# Patient Record
Sex: Female | Born: 1975
Health system: Southern US, Community
[De-identification: ages and names within clinical notes are randomized; demographics above are authoritative.]

## PROBLEM LIST (undated history)

## (undated) DIAGNOSIS — H269 Unspecified cataract: Secondary | ICD-10-CM

## (undated) DIAGNOSIS — G90529 Complex regional pain syndrome I of unspecified lower limb: Secondary | ICD-10-CM

## (undated) DIAGNOSIS — R2230 Localized swelling, mass and lump, unspecified upper limb: Secondary | ICD-10-CM

## (undated) DIAGNOSIS — Z889 Allergy status to unspecified drugs, medicaments and biological substances status: Secondary | ICD-10-CM

## (undated) DIAGNOSIS — T7840XA Allergy, unspecified, initial encounter: Secondary | ICD-10-CM

## (undated) DIAGNOSIS — E119 Type 2 diabetes mellitus without complications: Secondary | ICD-10-CM

## (undated) DIAGNOSIS — F419 Anxiety disorder, unspecified: Secondary | ICD-10-CM

## (undated) DIAGNOSIS — G4730 Sleep apnea, unspecified: Secondary | ICD-10-CM

## (undated) DIAGNOSIS — N189 Chronic kidney disease, unspecified: Secondary | ICD-10-CM

## (undated) DIAGNOSIS — I10 Essential (primary) hypertension: Secondary | ICD-10-CM

## (undated) DIAGNOSIS — K8020 Calculus of gallbladder without cholecystitis without obstruction: Secondary | ICD-10-CM

## (undated) HISTORY — DX: Allergy, unspecified, initial encounter: T78.40XA

## (undated) HISTORY — DX: Essential (primary) hypertension: I10

## (undated) HISTORY — PX: ABDOMINAL HYSTERECTOMY: SHX81

## (undated) HISTORY — DX: Anxiety disorder, unspecified: F41.9

## (undated) HISTORY — PX: TUBAL LIGATION: SHX77

## (undated) HISTORY — DX: Unspecified cataract: H26.9

## (undated) HISTORY — DX: Calculus of gallbladder without cholecystitis without obstruction: K80.20

## (undated) HISTORY — DX: Chronic kidney disease, unspecified: N18.9

## (undated) HISTORY — DX: Sleep apnea, unspecified: G47.30

---

## 1993-10-26 HISTORY — PX: KNEE ARTHROSCOPY: SUR90

## 1994-05-04 ENCOUNTER — Encounter: Payer: Self-pay | Admitting: Family Medicine

## 2003-01-28 ENCOUNTER — Emergency Department (HOSPITAL_COMMUNITY): Admission: EM | Admit: 2003-01-28 | Discharge: 2003-01-28 | Payer: Self-pay | Admitting: Emergency Medicine

## 2003-07-13 ENCOUNTER — Emergency Department (HOSPITAL_COMMUNITY): Admission: EM | Admit: 2003-07-13 | Discharge: 2003-07-13 | Payer: Self-pay | Admitting: Emergency Medicine

## 2003-07-13 ENCOUNTER — Encounter: Payer: Self-pay | Admitting: Emergency Medicine

## 2003-10-23 ENCOUNTER — Ambulatory Visit (HOSPITAL_COMMUNITY): Admission: RE | Admit: 2003-10-23 | Discharge: 2003-10-23 | Payer: Self-pay | Admitting: Internal Medicine

## 2004-10-09 ENCOUNTER — Inpatient Hospital Stay (HOSPITAL_COMMUNITY): Admission: AD | Admit: 2004-10-09 | Discharge: 2004-10-09 | Payer: Self-pay | Admitting: *Deleted

## 2004-10-13 ENCOUNTER — Inpatient Hospital Stay (HOSPITAL_COMMUNITY): Admission: AD | Admit: 2004-10-13 | Discharge: 2004-10-13 | Payer: Self-pay | Admitting: Obstetrics and Gynecology

## 2005-03-29 ENCOUNTER — Inpatient Hospital Stay (HOSPITAL_COMMUNITY): Admission: AD | Admit: 2005-03-29 | Discharge: 2005-04-03 | Payer: Self-pay | Admitting: Obstetrics & Gynecology

## 2005-04-07 ENCOUNTER — Encounter: Admission: RE | Admit: 2005-04-07 | Discharge: 2005-04-07 | Payer: Self-pay | Admitting: Obstetrics & Gynecology

## 2005-04-24 ENCOUNTER — Inpatient Hospital Stay (HOSPITAL_COMMUNITY): Admission: AD | Admit: 2005-04-24 | Discharge: 2005-04-25 | Payer: Self-pay | Admitting: Obstetrics & Gynecology

## 2005-05-01 ENCOUNTER — Inpatient Hospital Stay (HOSPITAL_COMMUNITY): Admission: AD | Admit: 2005-05-01 | Discharge: 2005-05-01 | Payer: Self-pay | Admitting: Obstetrics and Gynecology

## 2005-05-04 ENCOUNTER — Inpatient Hospital Stay (HOSPITAL_COMMUNITY): Admission: AD | Admit: 2005-05-04 | Discharge: 2005-05-04 | Payer: Self-pay | Admitting: Obstetrics and Gynecology

## 2005-05-07 ENCOUNTER — Inpatient Hospital Stay (HOSPITAL_COMMUNITY): Admission: AD | Admit: 2005-05-07 | Discharge: 2005-05-07 | Payer: Self-pay | Admitting: Obstetrics and Gynecology

## 2005-05-10 ENCOUNTER — Inpatient Hospital Stay (HOSPITAL_COMMUNITY): Admission: AD | Admit: 2005-05-10 | Discharge: 2005-05-10 | Payer: Self-pay | Admitting: Obstetrics and Gynecology

## 2005-05-11 ENCOUNTER — Inpatient Hospital Stay (HOSPITAL_COMMUNITY): Admission: AD | Admit: 2005-05-11 | Discharge: 2005-05-13 | Payer: Self-pay | Admitting: Obstetrics and Gynecology

## 2005-07-06 ENCOUNTER — Other Ambulatory Visit: Admission: RE | Admit: 2005-07-06 | Discharge: 2005-07-06 | Payer: Self-pay | Admitting: Obstetrics & Gynecology

## 2005-10-14 ENCOUNTER — Ambulatory Visit: Payer: Self-pay | Admitting: Family Medicine

## 2005-10-28 ENCOUNTER — Ambulatory Visit: Payer: Self-pay | Admitting: Family Medicine

## 2005-11-09 ENCOUNTER — Ambulatory Visit: Payer: Self-pay | Admitting: Family Medicine

## 2006-06-07 ENCOUNTER — Ambulatory Visit: Payer: Self-pay | Admitting: Family Medicine

## 2006-06-10 ENCOUNTER — Emergency Department (HOSPITAL_COMMUNITY): Admission: EM | Admit: 2006-06-10 | Discharge: 2006-06-11 | Payer: Self-pay | Admitting: Emergency Medicine

## 2006-06-11 ENCOUNTER — Ambulatory Visit: Payer: Self-pay | Admitting: Family Medicine

## 2006-09-07 ENCOUNTER — Ambulatory Visit: Payer: Self-pay | Admitting: Family Medicine

## 2006-10-26 HISTORY — PX: BREAST BIOPSY: SHX20

## 2006-11-19 ENCOUNTER — Encounter: Admission: RE | Admit: 2006-11-19 | Discharge: 2006-11-19 | Payer: Self-pay | Admitting: Obstetrics & Gynecology

## 2006-11-23 ENCOUNTER — Encounter: Admission: RE | Admit: 2006-11-23 | Discharge: 2006-11-23 | Payer: Self-pay | Admitting: Obstetrics & Gynecology

## 2006-12-20 ENCOUNTER — Ambulatory Visit (HOSPITAL_COMMUNITY): Admission: RE | Admit: 2006-12-20 | Discharge: 2006-12-20 | Payer: Self-pay | Admitting: General Surgery

## 2006-12-20 ENCOUNTER — Encounter (INDEPENDENT_AMBULATORY_CARE_PROVIDER_SITE_OTHER): Payer: Self-pay | Admitting: *Deleted

## 2006-12-20 HISTORY — PX: BREAST LUMPECTOMY W/ NEEDLE LOCALIZATION: SHX1266

## 2007-04-18 ENCOUNTER — Ambulatory Visit: Payer: Self-pay | Admitting: Family Medicine

## 2007-08-23 DIAGNOSIS — J45909 Unspecified asthma, uncomplicated: Secondary | ICD-10-CM | POA: Insufficient documentation

## 2007-11-17 ENCOUNTER — Ambulatory Visit: Payer: Self-pay | Admitting: Family Medicine

## 2008-01-02 ENCOUNTER — Telehealth: Payer: Self-pay | Admitting: Family Medicine

## 2008-01-03 ENCOUNTER — Ambulatory Visit: Payer: Self-pay | Admitting: Family Medicine

## 2008-01-03 LAB — CONVERTED CEMR LAB: Rapid Strep: NEGATIVE

## 2008-03-14 ENCOUNTER — Ambulatory Visit: Payer: Self-pay | Admitting: Internal Medicine

## 2008-03-14 DIAGNOSIS — J45901 Unspecified asthma with (acute) exacerbation: Secondary | ICD-10-CM | POA: Insufficient documentation

## 2008-03-15 DIAGNOSIS — J309 Allergic rhinitis, unspecified: Secondary | ICD-10-CM | POA: Insufficient documentation

## 2008-03-22 ENCOUNTER — Telehealth: Payer: Self-pay | Admitting: Family Medicine

## 2008-03-23 ENCOUNTER — Ambulatory Visit: Payer: Self-pay | Admitting: Family Medicine

## 2008-03-23 DIAGNOSIS — F411 Generalized anxiety disorder: Secondary | ICD-10-CM | POA: Insufficient documentation

## 2008-08-13 ENCOUNTER — Telehealth: Payer: Self-pay | Admitting: Family Medicine

## 2008-09-24 ENCOUNTER — Telehealth: Payer: Self-pay | Admitting: Family Medicine

## 2008-12-04 ENCOUNTER — Ambulatory Visit: Payer: Self-pay | Admitting: Family Medicine

## 2009-02-26 ENCOUNTER — Inpatient Hospital Stay (HOSPITAL_COMMUNITY): Admission: AD | Admit: 2009-02-26 | Discharge: 2009-02-26 | Payer: Self-pay | Admitting: Obstetrics and Gynecology

## 2009-02-26 ENCOUNTER — Ambulatory Visit: Payer: Self-pay | Admitting: Family

## 2009-03-01 ENCOUNTER — Inpatient Hospital Stay (HOSPITAL_COMMUNITY): Admission: AD | Admit: 2009-03-01 | Discharge: 2009-03-01 | Payer: Self-pay | Admitting: Obstetrics & Gynecology

## 2009-03-26 ENCOUNTER — Ambulatory Visit: Payer: Self-pay | Admitting: Physician Assistant

## 2009-03-26 ENCOUNTER — Inpatient Hospital Stay (HOSPITAL_COMMUNITY): Admission: AD | Admit: 2009-03-26 | Discharge: 2009-03-26 | Payer: Self-pay | Admitting: Obstetrics and Gynecology

## 2009-04-08 ENCOUNTER — Observation Stay (HOSPITAL_COMMUNITY): Admission: AD | Admit: 2009-04-08 | Discharge: 2009-04-09 | Payer: Self-pay | Admitting: Obstetrics and Gynecology

## 2009-04-09 ENCOUNTER — Encounter: Payer: Self-pay | Admitting: Obstetrics and Gynecology

## 2009-04-29 ENCOUNTER — Inpatient Hospital Stay (HOSPITAL_COMMUNITY): Admission: AD | Admit: 2009-04-29 | Discharge: 2009-05-13 | Payer: Self-pay | Admitting: Obstetrics and Gynecology

## 2009-06-13 ENCOUNTER — Inpatient Hospital Stay (HOSPITAL_COMMUNITY): Admission: AD | Admit: 2009-06-13 | Discharge: 2009-06-15 | Payer: Self-pay | Admitting: Obstetrics and Gynecology

## 2009-06-26 ENCOUNTER — Inpatient Hospital Stay (HOSPITAL_COMMUNITY): Admission: AD | Admit: 2009-06-26 | Discharge: 2009-07-04 | Payer: Self-pay | Admitting: Obstetrics and Gynecology

## 2009-06-30 ENCOUNTER — Encounter (INDEPENDENT_AMBULATORY_CARE_PROVIDER_SITE_OTHER): Payer: Self-pay | Admitting: Obstetrics and Gynecology

## 2010-10-31 ENCOUNTER — Encounter: Payer: Self-pay | Admitting: Family Medicine

## 2010-10-31 ENCOUNTER — Ambulatory Visit: Admit: 2010-10-31 | Payer: Self-pay | Admitting: Family Medicine

## 2010-10-31 ENCOUNTER — Ambulatory Visit
Admission: RE | Admit: 2010-10-31 | Discharge: 2010-10-31 | Payer: Self-pay | Source: Home / Self Care | Attending: Family Medicine | Admitting: Family Medicine

## 2010-11-18 ENCOUNTER — Encounter: Payer: Self-pay | Admitting: Family Medicine

## 2010-11-27 NOTE — Assessment & Plan Note (Signed)
Summary: cough/asthma/MMR,TDAP/?tetanus if needed/cjr   Vital Signs:  Patient profile:   35 year old female Weight:      169 pounds O2 Sat:      98 % Temp:     98.6 degrees F Pulse rate:   80 / minute BP sitting:   140 / 80  (left arm)  Vitals Entered By: Pura Spice, RN (October 31, 2010 10:38 AM) CC: cough asthma wheezing  needs tdap and mmr  going back to school   History of Present Illness: Here for an immunization update and to check her lungs. She had a URI a few weeks ago with a productive cough and a fever. This resolved but she has had a dry cough since then. She will be attending [employer] this spring and has some forms to fill out.   Allergies: 1)  ! * Zyrtec  Past History:  Past Medical History: Reviewed history from 03/14/2008 and no changes required. Asthma Migraines grass trees positive on allergy test.  Allergic rhinitis  Review of Systems  The patient denies anorexia, fever, weight loss, weight gain, vision loss, decreased hearing, hoarseness, chest pain, syncope, dyspnea on exertion, peripheral edema, headaches, hemoptysis, abdominal pain, melena, hematochezia, severe indigestion/heartburn, hematuria, incontinence, genital sores, muscle weakness, suspicious skin lesions, transient blindness, difficulty walking, depression, unusual weight change, abnormal bleeding, enlarged lymph nodes, angioedema, breast masses, and testicular masses.    Physical Exam  General:  Well-developed,well-nourished,in no acute distress; alert,appropriate and cooperative throughout examination Head:  Normocephalic and atraumatic without obvious abnormalities. No apparent alopecia or balding. Eyes:  No corneal or conjunctival inflammation noted. EOMI. Perrla. Funduscopic exam benign, without hemorrhages, exudates or papilledema. Vision grossly normal. Ears:  External ear exam shows no significant lesions or deformities.  Otoscopic examination reveals clear canals, tympanic membranes are  intact bilaterally without bulging, retraction, inflammation or discharge. Hearing is grossly normal bilaterally. Nose:  External nasal examination shows no deformity or inflammation. Nasal mucosa are pink and moist without lesions or exudates. Mouth:  Oral mucosa and oropharynx without lesions or exudates.  Teeth in good repair. Neck:  No deformities, masses, or tenderness noted. Lungs:  Normal respiratory effort, chest expands symmetrically. Lungs are clear to auscultation, no crackles or wheezes.   Impression & Recommendations:  Problem # 1:  VIRAL URI (ICD-465.9)  The following medications were removed from the medication list:    Hycodan 5-1.5 Mg/2ml Syrp (Hydrocodone-homatropine) .Marland Kitchen... 1-2 tsp by mouth q 4-6 hours  as needed cough Her updated medication list for this problem includes:    Hydromet 5-1.5 Mg/57ml Syrp (Hydrocodone-homatropine) .Marland Kitchen... 1 tsp q 4 hours as needed cough  Problem # 2:  ASTHMA (ICD-493.90)  The following medications were removed from the medication list:    Albuterol 90 Mcg/act Aers (Albuterol) .Marland Kitchen... As needed    Duoneb 2.5-0.5 Mg/83ml Soln (Albuterol-ipratropium) .Marland Kitchen... 1 every 4 hours as needed for sob Her updated medication list for this problem includes:    Singulair 10 Mg Tabs (Montelukast sodium) .Marland Kitchen... 1 by mouth once daily    Advair Diskus 250-50 Mcg/dose Misc (Fluticasone-salmeterol) .Marland Kitchen... 1 puff 2 times daily    Albuterol Sulfate (2.5 Mg/8ml) 0.083% Nebu (Albuterol sulfate) ..... Use q 4 hours as needed    Ventolin Hfa 108 (90 Base) Mcg/act Aers (Albuterol sulfate) .Marland Kitchen... 2 puffs q 4 hours as needed  Complete Medication List: 1)  Singulair 10 Mg Tabs (Montelukast sodium) .Marland Kitchen.. 1 by mouth once daily 2)  Advair Diskus 250-50 Mcg/dose Misc (Fluticasone-salmeterol) .Marland KitchenMarland KitchenMarland Kitchen  1 puff 2 times daily 3)  Albuterol Sulfate (2.5 Mg/33ml) 0.083% Nebu (Albuterol sulfate) .... Use q 4 hours as needed 4)  Ventolin Hfa 108 (90 Base) Mcg/act Aers (Albuterol sulfate) .... 2  puffs q 4 hours as needed 5)  Hydromet 5-1.5 Mg/92ml Syrp (Hydrocodone-homatropine) .Marland Kitchen.. 1 tsp q 4 hours as needed cough  Other Orders: Tdap => 15yrs IM (14782) State-Menactra IM (95621H) Admin 1st Vaccine (08657) Admin of Any Addtl Vaccine (84696)  Patient Instructions: 1)  Please schedule a follow-up appointment as needed .  Prescriptions: HYDROMET 5-1.5 MG/5ML SYRP (HYDROCODONE-HOMATROPINE) 1 tsp q 4 hours as needed cough  #240 x 0   Entered and Authorized by:   Nelwyn Salisbury MD   Signed by:   Nelwyn Salisbury MD on 10/31/2010   Method used:   Print then Give to Patient   RxID:   [id number] VENTOLIN HFA 108 (90 BASE) MCG/ACT AERS (ALBUTEROL SULFATE) 2 puffs q 4 hours as needed  #1 x 11   Entered and Authorized by:   Nelwyn Salisbury MD   Signed by:   Nelwyn Salisbury MD on 10/31/2010   Method used:   Electronically to        [facility] [address] (retail)       [address]       [place], [place]  [address]       Ph: [phone]       Fax: [phone]   RxID:   [id number] ADVAIR DISKUS 250-50 MCG/DOSE MISC (FLUTICASONE-SALMETEROL) 1 puff 2 times daily  #30 x 11   Entered and Authorized by:   Nelwyn Salisbury MD   Signed by:   Nelwyn Salisbury MD on 10/31/2010   Method used:   Electronically to        [facility] [address] (retail)       [address]       [place], [place]  [address]       Ph: [phone]       Fax: [phone]   RxID:   [id number] SINGULAIR 10 MG  TABS (MONTELUKAST SODIUM) 1 by mouth once daily  #30 x 11   Entered and Authorized by:   Nelwyn Salisbury MD   Signed by:   Nelwyn Salisbury MD on 10/31/2010   Method used:   Electronically to        [facility] [address] (retail)       [address]       [place], [place]  [address]       Ph: [phone]       Fax: [phone]   RxID:   [id number] ALBUTEROL SULFATE (2.5 MG/3ML) 0.083%  NEBU (ALBUTEROL SULFATE) use q 4 hours as needed  #50 x 11   Entered and Authorized by:   Nelwyn Salisbury MD   Signed by:   Nelwyn Salisbury MD on 10/31/2010   Method used:   Electronically to        [facility] [address] (retail)       [address]       [place], [place]  [address]       Ph: [phone]       Fax: [phone]  RxID:   [id number]    Orders Added: 1)  Tdap => 25yrs IM [90715] 2)  State-Menactra IM [90734S] 3)  Admin 1st Vaccine [90471] 4)  Admin of Any Addtl Vaccine [90472] 5)  Est. Patient Level IV [84132]   Immunizations Administered:  Tetanus Vaccine:    Vaccine Type: Tdap    Site: left deltoid    Mfr: GlaxoSmithKline    Dose: 0.5 ml    Route: IM    Given by: Pura Spice, RN    Exp. Date: 08/14/2012    Lot #: 08/14/2012    VIS given: 09/12/08 version given October 31, 2010.  Meningococcal Vaccine:    Vaccine Type: Menactra    Site: right deltoid    Mfr: Sanofi Pasteur    Dose: 0.5 ml    Route: IM    Given by: Pura Spice, RN    Exp. Date: 07/09/2012    Lot #: [id number]    VIS given: 11/22/06 version given October 31, 2010.   Immunizations Administered:  Tetanus Vaccine:    Vaccine Type: Tdap    Site: left deltoid    Mfr: GlaxoSmithKline    Dose: 0.5 ml    Route: IM    Given by: Pura Spice, RN    Exp. Date: 08/14/2012    Lot #: 08/14/2012    VIS given: 09/12/08 version given October 31, 2010.  Meningococcal Vaccine:    Vaccine Type: Menactra    Site: right deltoid    Mfr: Sanofi Pasteur    Dose: 0.5 ml    Route: IM    Given by: Pura Spice, RN    Exp. Date: 07/09/2012    Lot #: [id number]    VIS given: 11/22/06 version given October 31, 2010.

## 2010-11-27 NOTE — Miscellaneous (Signed)
Summary: Records from [facility] Dept [phone] - 1995  Records from [facility] Dept [phone] - 1995   Imported By: Maryln Gottron 11/06/2010 08:14:48  _____________________________________________________________________  External Attachment:    Type:   Image     Comment:   External Document

## 2010-11-27 NOTE — Letter (Signed)
Summary: Health Records for [employer]   Imported By: Maryln Gottron 11/06/2010 08:16:26  _____________________________________________________________________  External Attachment:    Type:   Image     Comment:   External Document

## 2010-12-03 NOTE — Medication Information (Signed)
Summary: PA and Approval for Singulair  PA and Approval for Singulair   Imported By: Maryln Gottron 11/27/2010 09:19:05  _____________________________________________________________________  External Attachment:    Type:   Image     Comment:   External Document

## 2011-01-13 ENCOUNTER — Encounter: Payer: Self-pay | Admitting: Family Medicine

## 2011-01-19 ENCOUNTER — Other Ambulatory Visit (INDEPENDENT_AMBULATORY_CARE_PROVIDER_SITE_OTHER): Payer: Self-pay | Admitting: Family Medicine

## 2011-01-19 DIAGNOSIS — Z0000 Encounter for general adult medical examination without abnormal findings: Secondary | ICD-10-CM

## 2011-01-19 LAB — CBC WITH DIFFERENTIAL/PLATELET
Basophils Absolute: 0.1 10*3/uL (ref 0.0–0.1)
Basophils Relative: 0.9 % (ref 0.0–3.0)
Eosinophils Absolute: 0.2 10*3/uL (ref 0.0–0.7)
Eosinophils Relative: 2.1 % (ref 0.0–5.0)
HCT: 40.5 % (ref 36.0–46.0)
Hemoglobin: 13.6 g/dL (ref 12.0–15.0)
Lymphocytes Relative: 26.9 % (ref 12.0–46.0)
Lymphs Abs: 2 10*3/uL (ref 0.7–4.0)
MCHC: 33.7 g/dL (ref 30.0–36.0)
MCV: 84.7 fl (ref 78.0–100.0)
Monocytes Absolute: 0.5 10*3/uL (ref 0.1–1.0)
Monocytes Relative: 6.3 % (ref 3.0–12.0)
Neutro Abs: 4.8 10*3/uL (ref 1.4–7.7)
Neutrophils Relative %: 63.8 % (ref 43.0–77.0)
Platelets: 358 10*3/uL (ref 150.0–400.0)
RBC: 4.78 Mil/uL (ref 3.87–5.11)
RDW: 15 % — ABNORMAL HIGH (ref 11.5–14.6)
WBC: 7.6 10*3/uL (ref 4.5–10.5)

## 2011-01-19 LAB — BASIC METABOLIC PANEL
BUN: 17 mg/dL (ref 6–23)
CO2: 25 mEq/L (ref 19–32)
Calcium: 9.4 mg/dL (ref 8.4–10.5)
Chloride: 103 mEq/L (ref 96–112)
Creatinine, Ser: 0.8 mg/dL (ref 0.4–1.2)
GFR: 104.19 mL/min (ref >60.00)
Glucose, Bld: 76 mg/dL (ref 70–99)
Potassium: 4.1 mEq/L (ref 3.5–5.1)
Sodium: 135 mEq/L (ref 135–145)

## 2011-01-19 LAB — TSH: TSH: 2.31 u[IU]/mL (ref 0.35–5.50)

## 2011-01-19 LAB — LIPID PANEL
Cholesterol: 226 mg/dL — ABNORMAL HIGH (ref 0–200)
HDL: 61.7 mg/dL (ref >39.00)
Total CHOL/HDL Ratio: 4
Triglycerides: 70 mg/dL (ref 0.0–149.0)
VLDL: 14 mg/dL (ref 0.0–40.0)

## 2011-01-19 LAB — HEPATIC FUNCTION PANEL
ALT: 10 U/L (ref 0–35)
AST: 15 U/L (ref 0–37)
Albumin: 4.3 g/dL (ref 3.5–5.2)
Alkaline Phosphatase: 77 U/L (ref 39–117)
Bilirubin, Direct: 0.1 mg/dL (ref 0.0–0.3)
Total Bilirubin: 0.4 mg/dL (ref 0.3–1.2)
Total Protein: 7.6 g/dL (ref 6.0–8.3)

## 2011-01-19 LAB — POCT URINALYSIS DIPSTICK
Bilirubin, UA: NEGATIVE
Blood, UA: NEGATIVE
Leukocytes, UA: NEGATIVE
Nitrite, UA: NEGATIVE
Protein, UA: NEGATIVE
Urobilinogen, UA: 0.2
pH, UA: 6.5

## 2011-01-20 ENCOUNTER — Telehealth: Payer: Self-pay

## 2011-01-20 DIAGNOSIS — E785 Hyperlipidemia, unspecified: Secondary | ICD-10-CM

## 2011-01-20 NOTE — Telephone Encounter (Signed)
Pt aware of lab results 

## 2011-01-20 NOTE — Telephone Encounter (Signed)
Message copied by Madison Hickman on Tue Jan 20, 2011 11:17 AM ------      Message from: Dwaine Deter      Created: Tue Jan 20, 2011  8:33 AM       Normal except high chol. Watch the diet

## 2011-01-30 LAB — CBC
HCT: 32.9 % — ABNORMAL LOW (ref 36.0–46.0)
Hemoglobin: 11.1 g/dL — ABNORMAL LOW (ref 12.0–15.0)
Hemoglobin: 11.3 g/dL — ABNORMAL LOW (ref 12.0–15.0)
MCHC: 33.3 g/dL (ref 30.0–36.0)
MCHC: 33.7 g/dL (ref 30.0–36.0)
MCV: 84 fL (ref 78.0–100.0)
RBC: 3.87 MIL/uL (ref 3.87–5.11)
RBC: 4.02 MIL/uL (ref 3.87–5.11)
RDW: 15.4 % (ref 11.5–15.5)
WBC: 9.2 10*3/uL (ref 4.0–10.5)

## 2011-01-30 LAB — URINE CULTURE
Colony Count: 80000
Special Requests: POSITIVE

## 2011-01-30 LAB — URINALYSIS, ROUTINE W REFLEX MICROSCOPIC
Bilirubin Urine: NEGATIVE
Glucose, UA: NEGATIVE mg/dL
Glucose, UA: NEGATIVE mg/dL
Hgb urine dipstick: NEGATIVE
Protein, ur: NEGATIVE mg/dL
Protein, ur: NEGATIVE mg/dL
Specific Gravity, Urine: 1.015 (ref 1.005–1.030)
Specific Gravity, Urine: <1.005 — ABNORMAL LOW (ref 1.005–1.030)
Urobilinogen, UA: 0.2 mg/dL (ref 0.0–1.0)
pH: 7 (ref 5.0–8.0)

## 2011-01-30 LAB — GLUCOSE, CAPILLARY
Glucose-Capillary: 100 mg/dL — ABNORMAL HIGH (ref 70–99)
Glucose-Capillary: 116 mg/dL — ABNORMAL HIGH (ref 70–99)
Glucose-Capillary: 74 mg/dL (ref 70–99)
Glucose-Capillary: 75 mg/dL (ref 70–99)

## 2011-01-30 LAB — URINE MICROSCOPIC-ADD ON

## 2011-01-30 LAB — MAGNESIUM: Magnesium: 5.5 mg/dL — ABNORMAL HIGH (ref 1.5–2.5)

## 2011-01-31 LAB — GLUCOSE, CAPILLARY
Glucose-Capillary: 100 mg/dL — ABNORMAL HIGH (ref 70–99)
Glucose-Capillary: 125 mg/dL — ABNORMAL HIGH (ref 70–99)
Glucose-Capillary: 138 mg/dL — ABNORMAL HIGH (ref 70–99)

## 2011-02-01 LAB — COMPREHENSIVE METABOLIC PANEL
Albumin: 2.7 g/dL — ABNORMAL LOW (ref 3.5–5.2)
Alkaline Phosphatase: 97 U/L (ref 39–117)
BUN: 3 mg/dL — ABNORMAL LOW (ref 6–23)
CO2: 22 mEq/L (ref 19–32)
Chloride: 108 mEq/L (ref 96–112)
Creatinine, Ser: 0.5 mg/dL (ref 0.4–1.2)
GFR calc non Af Amer: >60 mL/min (ref >60)
Glucose, Bld: 106 mg/dL — ABNORMAL HIGH (ref 70–99)
Potassium: 3.5 mEq/L (ref 3.5–5.1)
Total Bilirubin: 0.3 mg/dL (ref 0.3–1.2)

## 2011-02-01 LAB — CBC
HCT: 31.4 % — ABNORMAL LOW (ref 36.0–46.0)
Hemoglobin: 10.9 g/dL — ABNORMAL LOW (ref 12.0–15.0)
MCV: 85 fL (ref 78.0–100.0)
RBC: 3.69 MIL/uL — ABNORMAL LOW (ref 3.87–5.11)
WBC: 11.8 10*3/uL — ABNORMAL HIGH (ref 4.0–10.5)

## 2011-02-01 LAB — FETAL FIBRONECTIN: Fetal Fibronectin: POSITIVE

## 2011-02-02 LAB — CBC
MCHC: 34.3 g/dL (ref 30.0–36.0)
MCV: 84.8 fL (ref 78.0–100.0)
Platelets: 279 10*3/uL (ref 150–400)
RDW: 16 % — ABNORMAL HIGH (ref 11.5–15.5)
WBC: 10.5 10*3/uL (ref 4.0–10.5)

## 2011-02-02 LAB — URINALYSIS, ROUTINE W REFLEX MICROSCOPIC
Glucose, UA: NEGATIVE mg/dL
Hgb urine dipstick: NEGATIVE
Ketones, ur: 15 mg/dL — AB
Ketones, ur: NEGATIVE mg/dL
Protein, ur: NEGATIVE mg/dL
Protein, ur: NEGATIVE mg/dL
Specific Gravity, Urine: 1.015 (ref 1.005–1.030)
Urobilinogen, UA: 0.2 mg/dL (ref 0.0–1.0)
Urobilinogen, UA: 0.2 mg/dL (ref 0.0–1.0)

## 2011-02-02 LAB — COMPREHENSIVE METABOLIC PANEL
ALT: 16 U/L (ref 0–35)
AST: 18 U/L (ref 0–37)
Albumin: 3 g/dL — ABNORMAL LOW (ref 3.5–5.2)
Alkaline Phosphatase: 92 U/L (ref 39–117)
CO2: 21 mEq/L (ref 19–32)
Chloride: 106 mEq/L (ref 96–112)
Creatinine, Ser: 0.53 mg/dL (ref 0.4–1.2)
GFR calc Af Amer: >60 mL/min (ref >60)
GFR calc non Af Amer: >60 mL/min (ref >60)
Potassium: 3.5 mEq/L (ref 3.5–5.1)
Sodium: 134 mEq/L — ABNORMAL LOW (ref 135–145)
Total Bilirubin: 0.2 mg/dL — ABNORMAL LOW (ref 0.3–1.2)

## 2011-02-02 LAB — WET PREP, GENITAL

## 2011-02-03 LAB — URINALYSIS, ROUTINE W REFLEX MICROSCOPIC
Bilirubin Urine: NEGATIVE
Ketones, ur: NEGATIVE mg/dL
Ketones, ur: NEGATIVE mg/dL
Nitrite: NEGATIVE
Protein, ur: NEGATIVE mg/dL
Protein, ur: NEGATIVE mg/dL
Urobilinogen, UA: 1 mg/dL (ref 0.0–1.0)
pH: 7 (ref 5.0–8.0)

## 2011-02-03 LAB — CBC
HCT: 34.6 % — ABNORMAL LOW (ref 36.0–46.0)
Hemoglobin: 11.9 g/dL — ABNORMAL LOW (ref 12.0–15.0)
MCHC: 34.4 g/dL (ref 30.0–36.0)
Platelets: 301 10*3/uL (ref 150–400)
RDW: 16.4 % — ABNORMAL HIGH (ref 11.5–15.5)

## 2011-02-03 LAB — WET PREP, GENITAL
Clue Cells Wet Prep HPF POC: NONE SEEN
Trich, Wet Prep: NONE SEEN
Yeast Wet Prep HPF POC: NONE SEEN

## 2011-02-04 ENCOUNTER — Encounter: Payer: Self-pay | Admitting: Family Medicine

## 2011-02-09 ENCOUNTER — Ambulatory Visit (INDEPENDENT_AMBULATORY_CARE_PROVIDER_SITE_OTHER): Payer: BC Managed Care – PPO | Admitting: Family Medicine

## 2011-02-09 ENCOUNTER — Encounter: Payer: Self-pay | Admitting: Family Medicine

## 2011-02-09 VITALS — BP 120/80 | HR 86 | Ht 65.5 in | Wt 168.0 lb

## 2011-02-09 DIAGNOSIS — Z0000 Encounter for general adult medical examination without abnormal findings: Secondary | ICD-10-CM

## 2011-02-09 MED ORDER — ALPRAZOLAM 1 MG PO TABS: 1.0000 mg | ORAL_TABLET | Freq: Three times a day (TID) | ORAL | Status: AC | PRN

## 2011-02-09 NOTE — Progress Notes (Signed)
  Subjective:    Patient ID: Zoe Swanson, female    DOB: [birth year], 35 y.o.   MRN: [id number]  HPI 34 yr old female for a cpx. She feels well in general except for chronic fatigue and anxiety. She has 3 children, two of whom are 10 month old twins, and taking care of them is extremely difficult. They do not sleep much at night, and Zoe Swanson is the one to get up with them during the night. She gets very little sleep, and when she does get in bed she cannot relax due to worrying about the kids. She has used Xanax successfully in the past.    Review of Systems  Constitutional: Negative.   HENT: Negative.   Eyes: Negative.   Respiratory: Negative.   Cardiovascular: Negative.   Gastrointestinal: Negative.   Genitourinary: Negative for dysuria, urgency, frequency, hematuria, flank pain, decreased urine volume, enuresis, difficulty urinating, pelvic pain and dyspareunia.  Musculoskeletal: Negative.   Skin: Negative.   Neurological: Negative.   Hematological: Negative.   Psychiatric/Behavioral: Negative.        Objective:   Physical Exam  Constitutional: She is oriented to person, place, and time. She appears well-developed and well-nourished. No distress.  HENT:  Head: Normocephalic and atraumatic.  Right Ear: External ear normal.  Left Ear: External ear normal.  Nose: Nose normal.  Mouth/Throat: Oropharynx is clear and moist. No oropharyngeal exudate.  Eyes: Conjunctivae and EOM are normal. Pupils are equal, round, and reactive to light. No scleral icterus.  Neck: Normal range of motion. Neck supple. No JVD present. No thyromegaly present.  Cardiovascular: Normal rate, regular rhythm, normal heart sounds and intact distal pulses.  Exam reveals no gallop and no friction rub.   No murmur heard. Pulmonary/Chest: Effort normal and breath sounds normal. No respiratory distress. She has no wheezes. She has no rales. She exhibits no tenderness.  Abdominal: Soft. Bowel sounds are normal. She  exhibits no distension and no mass. There is no tenderness. There is no rebound and no guarding.  Musculoskeletal: Normal range of motion. She exhibits no edema and no tenderness.  Lymphadenopathy:    She has no cervical adenopathy.  Neurological: She is alert and oriented to person, place, and time. She has normal reflexes. No cranial nerve deficit. She exhibits normal muscle tone. Coordination normal.  Skin: Skin is warm and dry. No rash noted. No erythema.  Psychiatric: She has a normal mood and affect. Her behavior is normal. Judgment and thought content normal.          Assessment & Plan:  She seems to be quite healthy, but she is dealing with a lot of anxiety and she is sleep deprived. Try some Xanax again.

## 2011-03-10 NOTE — H&P (Signed)
Zoe Swanson, Zoe Swanson               ACCOUNT NO.:  [id number]   MEDICAL RECORD NO.:  [id number]          PATIENT TYPE:  MAT   LOCATION:  [facility]                          [facility]ILITY:  [facility]   PHYSICIAN:  Duke Salvia. Marcelle Overlie, M.D.DATE OF BIRTH:  1976-07-12   DATE OF ADMISSION:  06/13/2009  DATE OF DISCHARGE:                              HISTORY & PHYSICAL   CHIEF COMPLAINT:  Premature labor.   HISTORY OF PRESENT ILLNESS:  A 35 year old G2, P10, EGD November 4, EGA  [redacted] weeks.  Pregnancy is complicated by diamnionic dichorionic twins.  She had some early pregnancy hyperemesis.  She has been receiving weekly  progesterone injections.  Was hospitalized at 24 weeks for PTL.  Did  receive betamethasone at that time.  She has been on Tylox or Darvocet  p.r.n. for lower back pain and a terbutaline pump.  Last ultrasound  dated July 30 showed breech vertex twins but were concordant with growth  with cervical length of 2.6 cm.  Routine exam in the office today showed  the cervix was 2 cm, 80% twin A presenting with vertex.  Still  complaining of headache.  Urine negative for protein.  BP 120/80.   PAST MEDICAL HISTORY:  Please see the [facility] form for details.  One  vaginal delivery in 2006.  No known drug allergies.  Her blood type is O  positive.  A 3-hour GTT showed elevation consistent with gestational  diabetes.   PHYSICAL EXAM:  VITAL SIGNS:  Temperature 98.2, blood pressure 120/78.  HEENT:  Unremarkable.  NECK:  Supple without masses.  LUNGS:  Clear.  CARDIOVASCULAR:  Regular rate and rhythm without murmurs, rubs, or  gallops.  BREASTS:  Not examined.  PELVIC:  31 cm fundal height.  Fetal heart rates 140 A and B.  Cervix 2,  80% vertex, minus 2, membranes intact.  EXTREMITIES:  Unremarkable.  NEUROLOGIC:  Unremarkable.   IMPRESSION:  1. Diamnionic dichorionic female/female twins.  2. Preterm labor.  3. At 29 weeks' gestation.  4. Gestational diabetes.   PLAN:  Will admit for observation  and tocolysis.      Richard M. Marcelle Overlie, M.D.  Electronically Signed     RMH/MEDQ  D:  06/13/2009  T:  06/13/2009  Job:  [id number]

## 2011-03-10 NOTE — H&P (Signed)
Zoe Swanson, Zoe Swanson               ACCOUNT NO.:  [id number]   MEDICAL RECORD NO.:  [id number]          PATIENT TYPE:  INP   LOCATION:  9198                          [facility]ILITY[facility]   PHYSICIAN:  Duke Salvia. Marcelle Overlie, M.D.DATE OF BIRTH:  1976-07-02   DATE OF ADMISSION:  06/26/2009  DATE OF DISCHARGE:                              HISTORY & PHYSICAL   CHIEF COMPLAINT:  Premature labor.   HISTORY OF PRESENT ILLNESS:  A 35 year old G2, P1.  EDD is 08/29/09.  She is 30+ weeks gestation with diamniotic dichorionic twins on a  terbutaline pump.  Earlier in this pregnancy, she had premature labor,  was hospitalized, received betamethasone.  Has been at bedrest with a  terbutaline pump.  On a routine ultrasound exam today, was noted to have  a short cervix with funneling.  Was not examined but was sent  immediately over to MAU for further evaluation.   In MAU, cervix was noted to be 3, which is a change from her prior exam  of 2 cm and 80%.  She is admitted now for further evaluation and  magnesium sulfate tocolysis.  Ultrasound today was vertex breech  presentation.   PAST MEDICAL HISTORY:  Allergies none.   REVIEW OF SYSTEMS:  Significant for a gestational diabetes, a history of  mild asthma, not currently on medications.  For the remainder of her  past medical history, please see the hospital form for details.  She has  had 1 vaginal delivery in 2006, delivered at term.   PHYSICAL EXAMINATION:  Temperature 98.2, blood pressure 120/78.  HEENT:  Unremarkable.  Neck:  Supple without masses.  Lungs:  Clear.  Cardiovascular:  Rate and rhythm without murmurs, rubs, gallops.  Breasts:  Not examined.  A 35 cm fundal height.  Fetal heart rate in the  140s, A and B.  Cervix was 3, 80% vertex.  Membranes intact.  Extremities:  Unremarkable.  Neurologic:  Unremarkable.   IMPRESSION:  1. A 30+ week diamniotic dichorionic twin gestation.  2. Vertex breech presentation.  3. Premature labor.  4.  Gestational diabetes.   PLAN:  Will admit for magnesium sulfate tocolysis.  She has previously  had betamethasone.  Vaginal swab for GBS and penicillin protocol will be  instituted, pending results of the GBS culture.      Richard M. Marcelle Overlie, M.D.  Electronically Signed     RMH/MEDQ  D:  06/26/2009  T:  06/26/2009  Job:  [id number]

## 2011-03-13 NOTE — Discharge Summary (Signed)
Zoe Swanson, Zoe Swanson               ACCOUNT NO.:  [id number]   MEDICAL RECORD NO.:  [id number]          PATIENT TYPE:  INP   LOCATION:  9151                          [facility]ILITY:  [facility]   PHYSICIAN:  Guy Sandifer. Henderson Cloud, M.D. DATE OF BIRTH:  June 26, 1976   DATE OF ADMISSION:  04/29/2009  DATE OF DISCHARGE:  05/13/2009                               DISCHARGE SUMMARY   ADMITTING DIAGNOSES:  1. Intrauterine pregnancy at 22 weeks estimated gestational age.  2. Twin gestation.  3. Preterm labor.   DISCHARGE DIAGNOSES:  1. Intrauterine pregnancy at 24-4/7 weeks estimated gestational age.  2. Twin gestation.  3. Preterm labor, stable.   REASON FOR ADMISSION:  Please see written H and P.   HOSPITAL COURSE:  The patient is a 35 year old, gravida 2, para 1, that  was admitted to [facility] at 22 weeks estimated  gestational age with a twin gestation.  The patient presented to the  office, where she was complaining of increasing pressure and  contractions over the last 2-3 days.  The patient had previously been  admitted and evaluated for preterm labor.  Fetal fibronectin was  positive and cervical length was measured at 0.5 cm.  The patient was  now hospitalized for the rest of preterm labor and magnesium sulfate  administration.  The patient was started on magnesium sulfate and on the  following morning, the patient had complained of some shortness of  breath.  O2 sats were ordered, chest x-ray, and the patient was now  weaned off magnesium sulfate.  Chest x-ray had revealed some mild  pulmonary edema.  At this discontinuation of the magnesium sulfate, the  patient had been started on some terbutaline and lungs were now clear.  Contractions had been arrested.  The patient was also administered  betamethasone for enhancement of fetal lung maturity.  The patient  continued on p.o. terbutaline.  The babies were moving well.  Uterus was  nontender.  Maternal fetal medicine consult was  made.  Decision was made  to approve the patient for terbutaline pump and continued  hospitalization until at least 24 weeks.  The patient continued to be  stable without contractions.  Uterus was soft.  Fetal heart tones were  within normal limits.  No contractions were noted.  Ultrasound did  reveal some fetal discordancy of 23%.  Decision was made to discharge  the patient home on July 19 at 24-4/7 weeks on bedrest and subcu  terbutaline pump.   ACTIVITY:  Bedrest with bathroom privileges.   DIET:  Regular as tolerated.   DISCHARGE MEDICATIONS:  Continue Dilantin injections weekly, prenatal  vitamins 1 p.o. daily, terbutaline pump, and the patient was scheduled  to return to the office in approximately 2-3 days for ultrasound for  cervical length and fetal surveillance.      Julio Sicks, N.P.      Guy Sandifer. Henderson Cloud, M.D.  Electronically Signed    CC/MEDQ  D:  05/23/2009  T:  05/24/2009  Job:  [id number]

## 2011-03-13 NOTE — Discharge Summary (Signed)
Zoe Swanson, Zoe Swanson         ACCOUNT NO.:  [id number]   MEDICAL RECORD NO.:  [id number]          PATIENT TYPE:  INP   LOCATION:  9156                          [facility]ILITY[facility]   PHYSICIAN:  Juluis Mire, M.D.   DATE OF BIRTH:  1976/04/06   DATE OF ADMISSION:  03/29/2005  DATE OF DISCHARGE:                                 DISCHARGE SUMMARY   ADMITTING DIAGNOSIS:  Intrauterine pregnancy at 30 weeks with preterm  uterine activity.   DISCHARGE DIAGNOSES:  1.  Intrauterine pregnancy at 30 weeks with preterm uterine activity.  2.  Gestational diabetes.  3.  Development of pulmonary edema.   For complete history and physical please see written note.   COURSE IN THE HOSPITAL:  The patient was brought in and begun on subcu  followed by p.o. terbutaline. Because of symptoms associated with the  terbutaline she was switched to IV magnesium sulfate. With this she did have  a resolution of the uterine activity. Subsequently, a terbutaline pump was  begun and the IV magnesium sulfate was discontinued. She did develop some  chest discomfort and shortness of breath. Subsequent chest x-ray revealed  pulmonary edema and she was having a sinus tachycardia. In view of this she  was given Lasix, the terbutaline was discontinued, and she was begun on  Procardia 10 mg three times a day. With this the uterine activity continued  to be intermittent and irregular. On June 9 her cervix was long and closed,  no contractions were noted, and fetal heart rate was reassuring. It is of  note that she did have a group B strep screen that was positive. She also  was diagnosed with gestational diabetes and began management with diet.  Evidently has done relatively well on the diet and will continue to monitor  blood sugars at home.   COMPLICATIONS:  Noted above. The patient discharged home in stable  condition.   DISPOSITION:  Will be at bedrest. Call with increasing uterine activity.  Will continue the  p.o. terbutaline 10 mg three times a day. Will continue  dietary management of gestational diabetes. She will have a Glucometer and  be checking her blood sugars and recording those at home. We are going to  follow up her up on Monday, reassess cervix and glucose management at that  time.   One other note, the patient did have an ultrasound performed here in the  hospital. There was normal amniotic fluid. Also, all of our measurements  were consistent with dates. There was no evidence of intrauterine growth  restriction. The cervix was noted to be shortened to 1.4 cm. Again, preterm  warning signs are given and follow up in the office as noted.      JSM/MEDQ  D:  04/03/2005  T:  04/03/2005  Job:  [id number]

## 2011-03-13 NOTE — Op Note (Signed)
NAMEMAKINLEY, MUSCATO               ACCOUNT NO.:  [id number]   MEDICAL RECORD NO.:  [id number]          PATIENT TYPE:  AMB   LOCATION:  DAY                          FACILITY:  [facility]   PHYSICIAN:  Timothy E. Earlene Plater, M.D. DATE OF BIRTH:  1976/03/27   DATE OF PROCEDURE:  DATE OF DISCHARGE:                               OPERATIVE REPORT   PREOPERATIVE DIAGNOSIS:  Questionable papilloma, left breast.   POSTOPERATIVE DIAGNOSIS:  Questionable papilloma, left breast.   PROCEDURE:  Multiple excisions of needle localizing left breast.   SURGEON:  Timothy E. Earlene Plater, M.D.   ANESTHESIA:  General.   Zoe Swanson is 32, otherwise healthy. She has had clear to milky  secretions from the left breast for years. She has had a thorough workup  by Dr. Konrad Dolores, [facility] including an MRI.  Though all of these have been negative and she feels comfortable,  because of a strong family history of breast cancer, the patient just  does not want to tolerate this annoying secretion. When examined in the  office, she had 2 clearly distinguishable areas on the nipple, one in  the 12 o'clock position and 1 in the 8 o'clock position. Again she is  otherwise healthy. She has seen and identified the left breast mark and  the permit signed.   The patient was taken to the operating room, placed supine, LMA  anesthesia provided. The left breast was prepped and draped in the usual  fashion. Using the finest of the lacrimal duct probes, the breast was  gently massage and two discreet areas of secretions were clearly seen.  The one in the 8 o'clock position had a clear milky consistency much  like that of a sebaceous cyst. The 12 o'clock area was pure white milky.  Each was localized with the cannula. The closest palpable area to the 8  o'clock lesion was the lower inner quadrant of the 8 o'clock position.  The closest area around the circumareolar to the 12 o'clock cannula was  at the 3 o'clock  position, so each of these areas were carefully marked,  injected with 0.25% Marcaine with epinephrine, tiny incisions were made,  gentle blunt dissection was carried out until the wire was reached and  then the tissue around the wire up to the skin of the areola was  excised. Care was taken under magnification and a protected cautery tip  to cauterize the area and keep bleeding nil. This was carried out in  both locations each with a separate localizing cannula needle. Each  specimen was separately  submitted. Each wound was dry, each wound was closed with 4-0 Vicryl  subcuticular and Steri-Strips. Final counts correct. She tolerated it  well and was removed to the recovery room in good condition. She will be  seen and followed in the office. Wound care instructions given to her  and her husband.      Timothy E. Earlene Plater, M.D.  Electronically Signed     TED/MEDQ  D:  12/20/2006  T:  12/20/2006  Job:  [id number]   cc:   W.  Varney Baas, M.D.  Fax: [phone]   [facility]

## 2011-05-01 ENCOUNTER — Telehealth: Payer: Self-pay | Admitting: Family Medicine

## 2011-05-01 NOTE — Telephone Encounter (Signed)
Left message on machine for patient  To return our call 

## 2011-05-01 NOTE — Telephone Encounter (Signed)
I have no record of her ever taking Xanax. If she needs this, see me OV to discuss

## 2011-05-01 NOTE — Telephone Encounter (Signed)
Pt calling for refill on Xanax. Is not out of her meds, but was told to call early to be sure.

## 2011-05-04 ENCOUNTER — Telehealth: Payer: Self-pay | Admitting: Family Medicine

## 2011-05-04 ENCOUNTER — Other Ambulatory Visit: Payer: Self-pay | Admitting: Family Medicine

## 2011-05-04 NOTE — Telephone Encounter (Signed)
ok 

## 2011-05-04 NOTE — Telephone Encounter (Signed)
Pharmacy refill request for Xanax 1 mg, last office visit 02/09/11 and last filled on 04/16/11

## 2011-05-04 NOTE — Telephone Encounter (Signed)
I left a message, pt can get the pharmacy to fax [place] a refill request and we will try to get it straight.

## 2011-05-05 NOTE — Telephone Encounter (Signed)
Faxed back to [employer]

## 2011-05-15 ENCOUNTER — Ambulatory Visit (INDEPENDENT_AMBULATORY_CARE_PROVIDER_SITE_OTHER): Payer: BC Managed Care – PPO | Admitting: Family Medicine

## 2011-05-15 ENCOUNTER — Encounter: Payer: Self-pay | Admitting: Family Medicine

## 2011-05-15 VITALS — BP 120/80 | HR 82 | Temp 98.6°F | Wt 158.0 lb

## 2011-05-15 DIAGNOSIS — L259 Unspecified contact dermatitis, unspecified cause: Secondary | ICD-10-CM

## 2011-05-15 DIAGNOSIS — L309 Dermatitis, unspecified: Secondary | ICD-10-CM

## 2011-05-15 MED ORDER — PREDNISONE (PAK) 10 MG PO TABS: ORAL_TABLET | ORAL | Status: DC

## 2011-05-18 ENCOUNTER — Encounter: Payer: Self-pay | Admitting: Family Medicine

## 2011-05-18 NOTE — Progress Notes (Signed)
  Subjective:    Patient ID: Zoe Swanson, female    DOB: 1976/04/04, 35 y.o.   MRN: [id number]  HPI Here for one week of an itchy rash over the arms, legs, and trunk. She has never had anything like this before. No fever or systemic symptoms. Using OTC cortisone creams. No recent medication changes.    Review of Systems  Constitutional: Negative.   Respiratory: Negative.   Cardiovascular: Negative.   Skin: Positive for rash.       Objective:   Physical Exam  Constitutional: She appears well-developed and well-nourished.  Skin:       Diffuse maculopapular non-erythematous rash as above           Assessment & Plan:  This appears to be eczematous in origin. Treat with an oral steroid taper. Recheck prn

## 2011-05-27 ENCOUNTER — Encounter (HOSPITAL_BASED_OUTPATIENT_CLINIC_OR_DEPARTMENT_OTHER)
Admission: RE | Admit: 2011-05-27 | Discharge: 2011-05-27 | Disposition: A | Discharge status: Home / Self Care | Payer: BC Managed Care – PPO | Source: Ambulatory Visit | Attending: Orthopaedic Surgery | Admitting: Orthopaedic Surgery

## 2011-05-27 LAB — BASIC METABOLIC PANEL
BUN: 18 mg/dL (ref 6–23)
Chloride: 102 mEq/L (ref 96–112)
Creatinine, Ser: 0.6 mg/dL (ref 0.50–1.10)
GFR calc Af Amer: >60 mL/min (ref >60)
GFR calc non Af Amer: >60 mL/min (ref >60)
Potassium: 3.8 mEq/L (ref 3.5–5.1)

## 2011-06-02 ENCOUNTER — Ambulatory Visit (HOSPITAL_BASED_OUTPATIENT_CLINIC_OR_DEPARTMENT_OTHER)
Admission: RE | Admit: 2011-06-02 | Discharge: 2011-06-02 | Disposition: A | Discharge status: Home / Self Care | Payer: BC Managed Care – PPO | Source: Ambulatory Visit | Attending: Orthopaedic Surgery | Admitting: Orthopaedic Surgery

## 2011-06-02 DIAGNOSIS — M249 Joint derangement, unspecified: Secondary | ICD-10-CM | POA: Insufficient documentation

## 2011-06-02 DIAGNOSIS — Z01812 Encounter for preprocedural laboratory examination: Secondary | ICD-10-CM | POA: Insufficient documentation

## 2011-06-02 DIAGNOSIS — Z79899 Other long term (current) drug therapy: Secondary | ICD-10-CM | POA: Insufficient documentation

## 2011-06-02 DIAGNOSIS — I10 Essential (primary) hypertension: Secondary | ICD-10-CM | POA: Insufficient documentation

## 2011-06-02 DIAGNOSIS — G43909 Migraine, unspecified, not intractable, without status migrainosus: Secondary | ICD-10-CM | POA: Insufficient documentation

## 2011-06-02 DIAGNOSIS — M24073 Loose body in unspecified ankle: Secondary | ICD-10-CM | POA: Insufficient documentation

## 2011-06-02 DIAGNOSIS — J45909 Unspecified asthma, uncomplicated: Secondary | ICD-10-CM | POA: Insufficient documentation

## 2011-06-02 DIAGNOSIS — M24176 Other articular cartilage disorders, unspecified foot: Secondary | ICD-10-CM | POA: Insufficient documentation

## 2011-06-02 DIAGNOSIS — M2408 Loose body, other site: Secondary | ICD-10-CM | POA: Insufficient documentation

## 2011-06-02 DIAGNOSIS — F411 Generalized anxiety disorder: Secondary | ICD-10-CM | POA: Insufficient documentation

## 2011-06-02 HISTORY — PX: ANKLE ARTHROTOMY: SUR101

## 2011-06-02 LAB — POCT HEMOGLOBIN-HEMACUE: Hemoglobin: 14.7 g/dL (ref 12.0–15.0)

## 2011-06-10 NOTE — Op Note (Signed)
NAMEPEGI, MILAZZO               ACCOUNT NO.:  [id number]  MEDICAL RECORD NO.:  [id number]  LOCATION:                                 FACILITY:  PHYSICIAN:  Lubertha Basque. Jerl Santos, M.D.     DATE OF BIRTH:  DATE OF PROCEDURE:  06/02/2011 DATE OF DISCHARGE:                              OPERATIVE REPORT   PREOPERATIVE DIAGNOSIS:  Left ankle impingement.  POSTOPERATIVE DIAGNOSIS:  Left ankle impingement.  PROCEDURES: 1. Left ankle loose body removal. 2. Left ankle anterolateral ligament repair.  ANESTHESIA:  General and block.  ATTENDING SURGEON:  Lubertha Basque. Jerl Santos, MD  ASSISTANT:  Lindwood Qua, PA   INDICATIONS FOR PROCEDURE:  The patient is a 35 year old very active woman who injured her ankle many months back.  She suffered a bit of avulsion fracture of the anterolateral aspect of her fibula.  She has been left with impingement.  She has failed immobilization and rest followed by therapy and injections.  By MRI scan, she has a fragment of bone which is probably causing impingement in an anterolateral position. With pain that limits her ability to remain active, she is offered operative intervention.  Our plan is to perform an open excision of a loose body and ligament repair.  Informed operative consent was obtained after discussion of possible complications including reaction to anesthesia, infection, and neurovascular injury.  SUMMARY OF FINDINGS AND PROCEDURE:  Under general anesthesia through a small anterolateral incision, we performed an arthrotomy and removed a 0.5 x 1 x 1.5 cm loose body which was emanating from the anterolateral aspect of the fibula.  We then imbricated the underlying capsule and ligaments to perform a ligament repair.  She was closed primarily and discharged home same-day.  DESCRIPTION OF PROCEDURE:  The patient was taken to the operating suite where general anesthetic was applied without difficulty.  She also was given a block in the  preanesthesia area.  She was positioned supine with a bump under left hip and prepped and draped in normal sterile fashion. After administration of IV Kefzol, the left leg was elevated, exsanguinated, and tourniquet inflated about the calf.  The anterolateral incision was made along skin lines with dissection down to the capsule.  Some subcutaneous fat was excised and the capsule below was split longitudinally over the loose structure which could be palpated.  We dissected in both directions and this loose fragment with a size listed above was removed.  I used a rongeur to smooth off the anterior aspect of the fibula.  We could easily see into the joint and a thorough irrigation was done at the joint with saline.  I then imbricated the capsule as a means of reconstructing and tightening the anterolateral ligamentous structures.  We do not have to use a suture anchor.  I used 2-0 undyed Vicryl for this portion of the case.  We then released the tourniquet.  A small amount of bleeding was easily controlled with some pressure and Bovie cautery.  The ankle was examined with drawer test and inversion and the ligamentous structures were felt to be with appropriate tension.  The subcutaneous tissues were reapproximated with the same Vicryl suture followed by  skin closure with nylon.  Adaptic was applied followed by dry gauze and a posterior splint of plaster with ankle in neutral position.  Estimated blood loss and fluids can be obtained from anesthesia records as can accurate tourniquet time.  DISPOSITION:  The patient was extubated in the operating room, taken to recovery room in stable addition.  She was to go home same-day and follow up in the office closely.  I will contact her by phone tonight.     Lubertha Basque Jerl Santos, M.D.     PGD/MEDQ  D:  06/02/2011  T:  06/03/2011  Job:  [id number]  Electronically Signed by Marcene Corning M.D. on 06/10/2011 09:01:17 PM

## 2011-06-26 ENCOUNTER — Other Ambulatory Visit: Payer: Self-pay | Admitting: Internal Medicine

## 2011-06-26 ENCOUNTER — Other Ambulatory Visit: Payer: Self-pay

## 2011-06-26 NOTE — Telephone Encounter (Signed)
Dr Fry pt 

## 2011-06-26 NOTE — Telephone Encounter (Signed)
Call in #60 with 5 rf 

## 2011-06-26 NOTE — Telephone Encounter (Signed)
Last OV 05/15/11. Last filled 05/05/11

## 2011-06-30 MED ORDER — ALPRAZOLAM 1 MG PO TABS: 1.0000 mg | ORAL_TABLET | Freq: Three times a day (TID) | ORAL | Status: DC | PRN

## 2011-06-30 NOTE — Telephone Encounter (Signed)
rx called in #90  With no refills

## 2011-07-06 NOTE — Telephone Encounter (Signed)
I spoke with pharmacy and script was phoned in on 06/30/11.

## 2011-09-09 ENCOUNTER — Other Ambulatory Visit: Payer: Self-pay | Admitting: Internal Medicine

## 2011-09-09 NOTE — Telephone Encounter (Signed)
Pt last seen 05/15/11. Pls advise.

## 2011-09-09 NOTE — Telephone Encounter (Signed)
Dr Fry pt 

## 2011-09-10 NOTE — Telephone Encounter (Signed)
Call in #60 with 2 rf 

## 2011-12-10 ENCOUNTER — Telehealth: Payer: Self-pay | Admitting: Family Medicine

## 2011-12-10 NOTE — Telephone Encounter (Signed)
Set her up. Any 15 minute slot will be okay

## 2011-12-10 NOTE — Telephone Encounter (Signed)
Pt called and said that she needs to get a Health Examination for [facility]. Pts last cpx was on 02/09/11, but pt needs this done asap because this is a new job. Pt has to get a form filled in and tb test.

## 2011-12-17 NOTE — Telephone Encounter (Signed)
Called pt and sch her in 15 min slot for Monday 12/21/11 per Dr Clent Ridges, as noted.

## 2011-12-21 ENCOUNTER — Ambulatory Visit (INDEPENDENT_AMBULATORY_CARE_PROVIDER_SITE_OTHER): Payer: BC Managed Care – PPO | Admitting: Family Medicine

## 2011-12-21 ENCOUNTER — Encounter: Payer: Self-pay | Admitting: Family Medicine

## 2011-12-21 VITALS — BP 120/84 | HR 82 | Temp 98.6°F | Ht 66.25 in | Wt 152.0 lb

## 2011-12-21 DIAGNOSIS — Z209 Contact with and (suspected) exposure to unspecified communicable disease: Secondary | ICD-10-CM

## 2011-12-21 DIAGNOSIS — G4700 Insomnia, unspecified: Secondary | ICD-10-CM

## 2011-12-21 DIAGNOSIS — J45909 Unspecified asthma, uncomplicated: Secondary | ICD-10-CM

## 2011-12-21 DIAGNOSIS — J45998 Other asthma: Secondary | ICD-10-CM

## 2011-12-21 MED ORDER — ALPRAZOLAM 1 MG PO TABS: 1.0000 mg | ORAL_TABLET | Freq: Every evening | ORAL | Status: DC | PRN

## 2011-12-21 NOTE — Progress Notes (Signed)
  Subjective:    Patient ID: Zoe Swanson, female    DOB: July 27, 1976, 36 y.o.   MRN: [id number]  HPI Here for a health assessment for [facility] 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 She is doing well with no concerns. Her asthma is stable.    Review of Systems  Constitutional: Negative.   Respiratory: Negative.   Cardiovascular: Negative.   Gastrointestinal: Negative.        Objective:   Physical Exam  Constitutional: She appears well-developed and well-nourished.  Neck: Neck supple. No thyromegaly present.  Cardiovascular: Normal rate, regular rhythm, normal heart sounds and intact distal pulses.   Pulmonary/Chest: Effort normal and breath sounds normal.  Lymphadenopathy:    She has no cervical adenopathy.          Assessment & Plan:  Normal health assessment. She will return in 2-3 days to have the PPD read.

## 2012-03-03 ENCOUNTER — Encounter: Payer: Self-pay | Admitting: Family Medicine

## 2012-03-03 ENCOUNTER — Ambulatory Visit (INDEPENDENT_AMBULATORY_CARE_PROVIDER_SITE_OTHER): Payer: BC Managed Care – PPO | Admitting: Family Medicine

## 2012-03-03 VITALS — BP 118/78 | HR 84 | Temp 98.4°F | Wt 152.0 lb

## 2012-03-03 DIAGNOSIS — M940 Chondrocostal junction syndrome [Tietze]: Secondary | ICD-10-CM

## 2012-03-03 DIAGNOSIS — F411 Generalized anxiety disorder: Secondary | ICD-10-CM

## 2012-03-03 DIAGNOSIS — F419 Anxiety disorder, unspecified: Secondary | ICD-10-CM

## 2012-03-03 MED ORDER — ALPRAZOLAM 1 MG PO TABS: 1.0000 mg | ORAL_TABLET | Freq: Every evening | ORAL | Status: DC | PRN

## 2012-03-03 NOTE — Progress Notes (Signed)
  Subjective:    Patient ID: Zoe Swanson, female    DOB: 07/15/1976, 36 y.o.   MRN: [id number]  HPI Here for anxiety and for chest pains. This all started several weeks ago when she spent a week visiting her grandmother in the hospital. Her grandmother was quite ill and eventually passed away. Since then she has had spells when she feels very anxious, and she worries about everything. It is hard for her to sleep. She has also had frequent chest pains which are dull in nature and often last hours at a time. These pains make her feel SOB. No nausea or sweats.    Review of Systems  Constitutional: Negative.   Respiratory: Positive for chest tightness and shortness of breath. Negative for cough and wheezing.   Cardiovascular: Positive for chest pain. Negative for palpitations and leg swelling.  Psychiatric/Behavioral: Negative for confusion, dysphoric mood, decreased concentration and agitation. The patient is nervous/anxious.        Objective:   Physical Exam  Constitutional: She appears well-developed and well-nourished.  Neck: Neck supple. No thyromegaly present.  Cardiovascular: Normal rate, regular rhythm, normal heart sounds and intact distal pulses.   Pulmonary/Chest: Effort normal and breath sounds normal. No respiratory distress. She has no wheezes. She has no rales.       Very tender along both sternocostal margins   Lymphadenopathy:    She has no cervical adenopathy.  Psychiatric: She has a normal mood and affect. Her behavior is normal. Thought content normal.          Assessment & Plan:  She has costochondritis, and I reassured her that this is benign and will not harm her. Her heart is fine. She will use Aleve or Advl prn. She does not want to treat the anxiety with meds, so we will just monitor this for now. Exercise will help

## 2012-03-04 ENCOUNTER — Other Ambulatory Visit: Payer: Self-pay | Admitting: Family Medicine

## 2012-03-04 MED ORDER — DICLOFENAC SODIUM 75 MG PO TBEC: 75.0000 mg | DELAYED_RELEASE_TABLET | Freq: Two times a day (BID) | ORAL | Status: DC

## 2012-03-04 NOTE — Telephone Encounter (Signed)
Pt was seen yesterday from costochondritis pt would like anti inflammatory med call into [facility]

## 2012-03-04 NOTE — Telephone Encounter (Signed)
Please call this in  

## 2012-03-04 NOTE — Telephone Encounter (Signed)
Script sent e-scribe 

## 2012-03-04 NOTE — Telephone Encounter (Signed)
Call in Diclofenac 75 mg bid , #60 with 5 rf

## 2012-03-23 ENCOUNTER — Telehealth: Payer: Self-pay | Admitting: Family Medicine

## 2012-03-23 DIAGNOSIS — Z0000 Encounter for general adult medical examination without abnormal findings: Secondary | ICD-10-CM

## 2012-03-23 NOTE — Telephone Encounter (Signed)
I put future lab order in computer. 

## 2012-03-23 NOTE — Telephone Encounter (Signed)
Zoe Swanson, this pt is going to the [facility] lab on Friday for CPX labs. Can you put the orders in please? Thanks.

## 2012-03-25 ENCOUNTER — Other Ambulatory Visit: Payer: BC Managed Care – PPO

## 2012-03-25 ENCOUNTER — Other Ambulatory Visit (INDEPENDENT_AMBULATORY_CARE_PROVIDER_SITE_OTHER): Payer: BC Managed Care – PPO

## 2012-03-25 DIAGNOSIS — Z0000 Encounter for general adult medical examination without abnormal findings: Secondary | ICD-10-CM

## 2012-03-25 LAB — CBC WITH DIFFERENTIAL/PLATELET
Basophils Relative: 0.8 % (ref 0.0–3.0)
Eosinophils Relative: 0.8 % (ref 0.0–5.0)
HCT: 41.8 % (ref 36.0–46.0)
Hemoglobin: 13.8 g/dL (ref 12.0–15.0)
Lymphs Abs: 2.4 10*3/uL (ref 0.7–4.0)
MCV: 85.6 fl (ref 78.0–100.0)
Monocytes Relative: 6.6 % (ref 3.0–12.0)
Neutro Abs: 4.8 10*3/uL (ref 1.4–7.7)
Platelets: 314 10*3/uL (ref 150.0–400.0)
RBC: 4.88 Mil/uL (ref 3.87–5.11)
WBC: 7.8 10*3/uL (ref 4.5–10.5)

## 2012-03-25 LAB — HEPATIC FUNCTION PANEL
ALT: 11 U/L (ref 0–35)
AST: 13 U/L (ref 0–37)
Total Bilirubin: 0.6 mg/dL (ref 0.3–1.2)
Total Protein: 8 g/dL (ref 6.0–8.3)

## 2012-03-25 LAB — BASIC METABOLIC PANEL
Chloride: 103 mEq/L (ref 96–112)
GFR: 106.59 mL/min (ref >60.00)
Potassium: 3.6 mEq/L (ref 3.5–5.1)
Sodium: 137 mEq/L (ref 135–145)

## 2012-03-25 LAB — TSH: TSH: 3.04 u[IU]/mL (ref 0.35–5.50)

## 2012-03-25 LAB — LIPID PANEL
LDL Cholesterol: 112 mg/dL — ABNORMAL HIGH (ref 0–99)
VLDL: 13.4 mg/dL (ref 0.0–40.0)

## 2012-03-29 ENCOUNTER — Encounter: Payer: Self-pay | Admitting: Family Medicine

## 2012-03-29 NOTE — Progress Notes (Signed)
Quick Note:  I spoke with pt and put a copy of results in mail. ______ 

## 2012-04-06 ENCOUNTER — Encounter: Payer: BC Managed Care – PPO | Admitting: Family Medicine

## 2012-04-11 ENCOUNTER — Telehealth: Payer: Self-pay | Admitting: Family Medicine

## 2012-04-11 NOTE — Telephone Encounter (Signed)
Refill request for Alprazolam 1 mg, is this tid prn and can we approve refill?

## 2012-04-11 NOTE — Telephone Encounter (Signed)
I called pharmacy and left a voice message. Pt should have plenty of refills.

## 2012-04-11 NOTE — Telephone Encounter (Signed)
She told me she only takes Xanax once a day at bedtime, so we recently gave her a 6 month supply

## 2012-04-11 NOTE — Telephone Encounter (Signed)
Pharmacy needs clarification on ALPRAZolam Prudy Feeler) 1 MG tablet  Pt states rx has recently been changed   Please contact pharmacy

## 2012-04-12 ENCOUNTER — Telehealth: Payer: Self-pay | Admitting: Family Medicine

## 2012-04-12 NOTE — Telephone Encounter (Signed)
I spoke with pt and she said that it was discussed during her last vist that she could increase to bid if she needed to and now she is out?

## 2012-04-13 MED ORDER — ALPRAZOLAM 1 MG PO TABS: 1.0000 mg | ORAL_TABLET | Freq: Two times a day (BID) | ORAL | Status: DC | PRN

## 2012-04-13 NOTE — Telephone Encounter (Signed)
I called in script and spoke with pt. 

## 2012-04-13 NOTE — Telephone Encounter (Signed)
Call in Xanax 1 mg bid , #60 with 5 rf 

## 2012-05-11 ENCOUNTER — Encounter: Payer: Self-pay | Admitting: Family Medicine

## 2012-05-11 ENCOUNTER — Other Ambulatory Visit (HOSPITAL_COMMUNITY)
Admission: RE | Admit: 2012-05-11 | Discharge: 2012-05-11 | Disposition: A | Discharge status: Home / Self Care | Payer: BC Managed Care – PPO | Source: Ambulatory Visit | Attending: Family Medicine | Admitting: Family Medicine

## 2012-05-11 ENCOUNTER — Ambulatory Visit (INDEPENDENT_AMBULATORY_CARE_PROVIDER_SITE_OTHER): Payer: BC Managed Care – PPO | Admitting: Family Medicine

## 2012-05-11 VITALS — BP 120/86 | HR 85 | Temp 98.6°F | Ht 65.75 in | Wt 156.0 lb

## 2012-05-11 DIAGNOSIS — N63 Unspecified lump in unspecified breast: Secondary | ICD-10-CM

## 2012-05-11 DIAGNOSIS — Z01419 Encounter for gynecological examination (general) (routine) without abnormal findings: Secondary | ICD-10-CM | POA: Insufficient documentation

## 2012-05-11 DIAGNOSIS — Z0000 Encounter for general adult medical examination without abnormal findings: Secondary | ICD-10-CM

## 2012-05-11 NOTE — Addendum Note (Signed)
Addended by: Aniceto Boss A on: 05/11/2012 03:55 PM   Modules accepted: Orders

## 2012-05-11 NOTE — Progress Notes (Signed)
Subjective:    Patient ID: Zoe Swanson, female    DOB: December 26, 1975, 36 y.o.   MRN: [id number]  HPI 36 yr old female for a cpx. She feels fine and has no concerns. Her asthma is well controlled, and she seldom has to use her nebulizer. She has not had her breasts checked since her biopsy done in 2008.    Review of Systems  Constitutional: Negative.  Negative for fever, diaphoresis, activity change, appetite change, fatigue and unexpected weight change.  HENT: Negative.  Negative for hearing loss, ear pain, nosebleeds, congestion, sore throat, trouble swallowing, neck pain, neck stiffness, voice change and tinnitus.   Eyes: Negative.  Negative for photophobia, pain, discharge, redness and visual disturbance.  Respiratory: Negative.  Negative for apnea, cough, choking, chest tightness, shortness of breath, wheezing and stridor.   Cardiovascular: Negative.  Negative for chest pain, palpitations and leg swelling.  Gastrointestinal: Negative.  Negative for nausea, vomiting, abdominal pain, diarrhea, constipation, blood in stool, abdominal distention and rectal pain.  Genitourinary: Negative.  Negative for dysuria, urgency, frequency, hematuria, flank pain, vaginal bleeding, vaginal discharge, enuresis, difficulty urinating, vaginal pain and menstrual problem.  Musculoskeletal: Negative.  Negative for myalgias, back pain, joint swelling, arthralgias and gait problem.  Skin: Negative.  Negative for color change, pallor, rash and wound.  Neurological: Negative.  Negative for dizziness, tremors, seizures, syncope, speech difficulty, weakness, light-headedness, numbness and headaches.  Hematological: Negative.  Negative for adenopathy. Does not bruise/bleed easily.  Psychiatric/Behavioral: Negative.  Negative for hallucinations, behavioral problems, confusion, disturbed wake/sleep cycle, dysphoric mood and agitation. The patient is not nervous/anxious.        Objective:   Physical Exam    Constitutional: She appears well-developed and well-nourished. No distress.  HENT:  Head: Normocephalic and atraumatic.  Right Ear: External ear normal.  Left Ear: External ear normal.  Nose: Nose normal.  Mouth/Throat: Oropharynx is clear and moist. No oropharyngeal exudate.  Eyes: Conjunctivae and EOM are normal. Pupils are equal, round, and reactive to light. Right eye exhibits no discharge. Left eye exhibits no discharge. No scleral icterus.  Neck: Normal range of motion. Neck supple. No JVD present. No thyromegaly present.  Cardiovascular: Normal rate, regular rhythm, normal heart sounds and intact distal pulses.  Exam reveals no gallop and no friction rub.   No murmur heard. Pulmonary/Chest: Effort normal and breath sounds normal. No stridor. No respiratory distress. She has no wheezes. She has no rales. She exhibits no tenderness.  Abdominal: Soft. Normal appearance and bowel sounds are normal. She exhibits no distension, no abdominal bruit, no ascites and no mass. There is no hepatosplenomegaly. There is no tenderness. There is no rigidity, no rebound and no guarding. No hernia.  Genitourinary: Rectum normal, vagina normal and uterus normal. No breast swelling, tenderness, discharge or bleeding. Cervix exhibits no motion tenderness, no discharge and no friability. Right adnexum displays no mass, no tenderness and no fullness. Left adnexum displays no mass, no tenderness and no fullness. No erythema, tenderness or bleeding around the vagina. No vaginal discharge found.  Musculoskeletal: Normal range of motion. She exhibits no edema and no tenderness.  Lymphadenopathy:    She has no cervical adenopathy.  Neurological: She is alert. She has normal reflexes. No cranial nerve deficit. She exhibits normal muscle tone. Coordination normal.  Skin: Skin is warm and dry. No rash noted. She is not diaphoretic. No erythema. No pallor.  Psychiatric: She has a normal mood and affect. Her behavior is  normal. Judgment and  thought content normal.          Assessment & Plan:  Well exam. We will have her follow up with Dr. Earlene Plater again for a breast check.

## 2012-05-18 NOTE — Progress Notes (Signed)
Quick Note:  I spoke with pt ______ 

## 2012-09-27 ENCOUNTER — Telehealth: Payer: Self-pay | Admitting: Family Medicine

## 2012-09-27 ENCOUNTER — Encounter: Payer: Self-pay | Admitting: Family Medicine

## 2012-09-27 ENCOUNTER — Ambulatory Visit (INDEPENDENT_AMBULATORY_CARE_PROVIDER_SITE_OTHER): Payer: BC Managed Care – PPO | Admitting: Family Medicine

## 2012-09-27 VITALS — BP 140/80 | HR 86 | Temp 98.8°F

## 2012-09-27 DIAGNOSIS — I10 Essential (primary) hypertension: Secondary | ICD-10-CM

## 2012-09-27 MED ORDER — LISINOPRIL-HYDROCHLOROTHIAZIDE 10-12.5 MG PO TABS: 1.0000 | ORAL_TABLET | Freq: Every day | ORAL | Status: DC

## 2012-09-27 NOTE — Progress Notes (Signed)
  Subjective:    Patient ID: Zoe Swanson, female    DOB: 25-Feb-1976, 36 y.o.   MRN: [id number]  HPI Here for elevated BP readings. Over the past few weeks they have been watching this at home, and all her readings have been in the 140s to 160s systolic over 85 to 100 diastolic. She feels fine. She had been on HCTZ for edema until we stopped this earlier this year.    Review of Systems  Constitutional: Negative.   Respiratory: Negative.   Cardiovascular: Negative.   Neurological: Negative.        Objective:   Physical Exam  Constitutional: She is oriented to person, place, and time. She appears well-developed and well-nourished.  Cardiovascular: Normal rate, regular rhythm, normal heart sounds and intact distal pulses.   Pulmonary/Chest: Effort normal and breath sounds normal.  Lymphadenopathy:    She has no cervical adenopathy.  Neurological: She is alert and oriented to person, place, and time.          Assessment & Plan:  Start on Lisinopril HCT daily and recheck in 2 weeks

## 2012-09-27 NOTE — Telephone Encounter (Signed)
Patient Information:  Caller Name: Chala  Phone: [phone]  Patient: Zoe Swanson, Zoe Swanson  Gender: Female  DOB: [birth year]  Age: 36 Years  PCP: Gershon Crane [facility])  Pregnant: No   Symptoms  Reason For Call & Symptoms: HTN, headache  Reviewed Health History In EMR: Yes  Reviewed Medications In EMR: Yes  Reviewed Allergies In EMR: Yes  Reviewed Surgeries / Procedures: Yes  Date of Onset of Symptoms: 09/13/2012 OB:  LMP: 08/29/2012  Guideline(s) Used:  High Blood Pressure  Disposition Per Guideline:   See Today in Office  Reason For Disposition Reached:   Patient wants to be seen  Advice Given:  General:  The goal of blood pressure treatment for most patients with hypertension is to keep the blood pressure under 140/90.  General:  The goal of blood pressure treatment for most patients with hypertension is to keep the blood pressure under 140/90.  Call Back If:  Headache, blurred vision, difficulty talking, or difficulty walking occurs  Chest pain or difficulty breathing occurs  You become worse.  Office Follow Up:  Does the office need to follow up with this patient?: No  Instructions For The Office: N/A  Appointment Scheduled:  09/27/2012 15:00:00 Appointment Scheduled Provider:  Gershon Crane [facility])

## 2012-10-10 ENCOUNTER — Telehealth: Payer: Self-pay | Admitting: Family Medicine

## 2012-10-10 NOTE — Telephone Encounter (Signed)
Refill request for Alprazolam 1 mg take 1 po bid prn and last here on 09/27/12.

## 2012-10-11 ENCOUNTER — Telehealth: Payer: Self-pay | Admitting: Family Medicine

## 2012-10-11 MED ORDER — ALPRAZOLAM 1 MG PO TABS: 1.0000 mg | ORAL_TABLET | Freq: Two times a day (BID) | ORAL | Status: DC | PRN

## 2012-10-11 NOTE — Telephone Encounter (Signed)
Will be glad to give ov for tomorrow

## 2012-10-11 NOTE — Telephone Encounter (Signed)
We can see her tomorrow

## 2012-10-11 NOTE — Telephone Encounter (Signed)
I called in script 

## 2012-10-11 NOTE — Telephone Encounter (Signed)
Left message on machine Needs ov with dr fry on wednesday

## 2012-10-11 NOTE — Telephone Encounter (Signed)
Patient Information:  Caller Name: [place]  Phone: [phone]  Patient: Zoe Swanson, Zoe Swanson  Gender: Female  DOB: [birth year]  Age: 36 Years  PCP: Gershon Crane [facility])  Pregnant: No  Office Follow Up:  Does the office need to follow up with this patient?: Yes  Instructions For The Office: Pt would like to know what she should do about her BP meds in regards to her sxs   Symptoms  Reason For Call & Symptoms: said BP meds were changed by Dr.Fry 09/27/12; says she missed a dose and then started to take the med in the am; feels jittery and lightheaded; feels that it has made her feel bad because she was taken the med in the am without eating; havign urinary frequeny; BS 122; says she has a "hot/cold" feeling; denies vomiting; c/o dull HA;  Reviewed Health History In EMR: Yes  Reviewed Medications In EMR: Yes  Reviewed Allergies In EMR: Yes  Reviewed Surgeries / Procedures: Yes  Date of Onset of Symptoms: 10/08/2012  Treatments Tried: eating, but food just tastes sour; has been laying down  Treatments Tried Worked: No OB:  LMP: 10/03/2012  Guideline(s) Used:  Dizziness  Disposition Per Guideline:   Go to Office Now  Reason For Disposition Reached:   Lightheadedness (dizziness) present now, after 2 hours of rest and fluids  Advice Given:  Temporary Dizziness  is usually a harmless symptom. It can be caused by not drinking enough water during sports or hot weather. It can also be caused by skipping a meal, too much sun exposure, standing up suddenly, standing too long in one place or even a viral illness.  Some Causes of Temporary Dizziness:  Standing Up Suddenly - Standing up suddenly (especially getting out of bed) or prolonged standing in one place are common causes of temporary dizziness. Not drinking enough fluids always makes it worse. Certain medications can cause or increase this type of dizziness (e.g., blood pressure medications).  Drink Fluids:  Drink several  glasses of fruit juice, other clear fluids, or water. This will improve hydration and blood glucose. If you have a fever or have had heat exposure, make sure the fluids are cold.  Rest for 1-2 Hours:  Lie down with feet elevated for 1 hour. This will improve blood flow and increase blood flow to the brain.  Stand Up Slowly:  In the mornings, sit up for a few minutes before you stand up. That will help your blood flow make the adjustment.  If you have to stand up for long periods of time, contract and relax your leg muscles to help pump the blood back to the heart.  Sit down or lie down if you feel dizzy.  Call Back If:  Still feel dizzy after 2 hours of rest and fluids  Passes out (faints)  You become worse.  Patient Refused Recommendation:  Patient Will Follow Up With Office Later  Pt says she can not drive and has no ride to the office for an appt

## 2012-10-11 NOTE — Telephone Encounter (Signed)
Call in #60 with 5 rf 

## 2012-10-12 ENCOUNTER — Encounter: Payer: Self-pay | Admitting: Family Medicine

## 2012-10-12 ENCOUNTER — Ambulatory Visit (INDEPENDENT_AMBULATORY_CARE_PROVIDER_SITE_OTHER): Payer: BC Managed Care – PPO | Admitting: Family Medicine

## 2012-10-12 VITALS — BP 126/82 | HR 96 | Temp 98.0°F | Wt 160.0 lb

## 2012-10-12 DIAGNOSIS — R82998 Other abnormal findings in urine: Secondary | ICD-10-CM

## 2012-10-12 DIAGNOSIS — R7309 Other abnormal glucose: Secondary | ICD-10-CM

## 2012-10-12 DIAGNOSIS — R739 Hyperglycemia, unspecified: Secondary | ICD-10-CM

## 2012-10-12 DIAGNOSIS — I10 Essential (primary) hypertension: Secondary | ICD-10-CM

## 2012-10-12 DIAGNOSIS — R8290 Unspecified abnormal findings in urine: Secondary | ICD-10-CM

## 2012-10-12 LAB — POCT URINALYSIS DIPSTICK
Bilirubin, UA: NEGATIVE
Glucose, UA: NEGATIVE
Leukocytes, UA: NEGATIVE
Nitrite, UA: NEGATIVE
Urobilinogen, UA: 0.2

## 2012-10-12 MED ORDER — LISINOPRIL 10 MG PO TABS: 10.0000 mg | ORAL_TABLET | Freq: Every day | ORAL | Status: DC

## 2012-10-12 NOTE — Progress Notes (Signed)
  Subjective:    Patient ID: Zoe Swanson, female    DOB: [birth year], 36 y.o.   MRN: [id number]  HPI Here for several days of lightheadedness and weakness. We recently started her on Lisinopril HCT for HTN , and she felt fine for the first week. Then she started feeling weak, and she checked a random glucose yesterday and it was 224. Today she feels better.    Review of Systems  Constitutional: Positive for fatigue.  Respiratory: Negative.   Cardiovascular: Negative.   Neurological: Positive for light-headedness. Negative for dizziness and headaches.       Objective:   Physical Exam  Constitutional: She is oriented to person, place, and time. She appears well-developed and well-nourished.  Neck: No thyromegaly present.  Cardiovascular: Normal rate, regular rhythm, normal heart sounds and intact distal pulses.   Pulmonary/Chest: Effort normal and breath sounds normal.  Lymphadenopathy:    She has no cervical adenopathy.  Neurological: She is alert and oriented to person, place, and time.          Assessment & Plan:  I think she has been a little dehydrated so we will change her HTN med to plain Lisinopril. She will drink more water every day. Check an A1c today since her glucose was so high. She has a hx of gestational diabetes.

## 2012-10-12 NOTE — Addendum Note (Signed)
Addended by: Aniceto Boss A on: 10/12/2012 05:26 PM   Modules accepted: Orders

## 2012-10-31 ENCOUNTER — Ambulatory Visit: Payer: BC Managed Care – PPO | Admitting: Family Medicine

## 2012-10-31 DIAGNOSIS — Z0289 Encounter for other administrative examinations: Secondary | ICD-10-CM

## 2013-03-01 ENCOUNTER — Ambulatory Visit: Payer: BC Managed Care – PPO | Admitting: Family Medicine

## 2013-03-01 ENCOUNTER — Ambulatory Visit (INDEPENDENT_AMBULATORY_CARE_PROVIDER_SITE_OTHER): Payer: BC Managed Care – PPO | Admitting: Family Medicine

## 2013-03-01 ENCOUNTER — Encounter: Payer: Self-pay | Admitting: Family Medicine

## 2013-03-01 VITALS — BP 130/80 | HR 99 | Temp 98.3°F | Wt 164.0 lb

## 2013-03-01 DIAGNOSIS — G8929 Other chronic pain: Secondary | ICD-10-CM

## 2013-03-01 DIAGNOSIS — M79609 Pain in unspecified limb: Secondary | ICD-10-CM

## 2013-03-01 DIAGNOSIS — F411 Generalized anxiety disorder: Secondary | ICD-10-CM

## 2013-03-01 DIAGNOSIS — G4700 Insomnia, unspecified: Secondary | ICD-10-CM

## 2013-03-01 MED ORDER — ZOLPIDEM TARTRATE 10 MG PO TABS: 10.0000 mg | ORAL_TABLET | Freq: Every evening | ORAL | Status: DC | PRN

## 2013-03-01 NOTE — Progress Notes (Signed)
  Subjective:    Patient ID: Zoe Swanson, female    DOB: Apr 28, 1976, 37 y.o.   MRN: [id number]  HPI Here asking for help with sleep. She has struggled with sleep for several years but lately it has been a real problem. She deals with chronic pain in the left ankle and foot, and she has used various narcotic meds for this. However none of these help her sleep. She used Ambien successfully about 3 years ago.    Review of Systems  Constitutional: Negative.   Musculoskeletal: Positive for myalgias and arthralgias.       Objective:   Physical Exam  Constitutional: She is oriented to person, place, and time. She appears well-developed and well-nourished.  Cardiovascular: Normal rate, regular rhythm, normal heart sounds and intact distal pulses.   Pulmonary/Chest: Effort normal and breath sounds normal.  Neurological: She is alert and oriented to person, place, and time.  Psychiatric: She has a normal mood and affect. Her behavior is normal. Thought content normal.          Assessment & Plan:  She already knows to use Advil prn. Try Ambien for sleep

## 2013-03-17 ENCOUNTER — Telehealth: Payer: Self-pay | Admitting: Family Medicine

## 2013-03-17 MED ORDER — ALPRAZOLAM 1 MG PO TABS: 1.0000 mg | ORAL_TABLET | Freq: Two times a day (BID) | ORAL | Status: DC | PRN

## 2013-03-17 NOTE — Telephone Encounter (Signed)
Call in #60 with 5 rf 

## 2013-03-17 NOTE — Telephone Encounter (Signed)
Refill request for Alprazolam 1 mg take 1 po bid prn.

## 2013-03-17 NOTE — Telephone Encounter (Signed)
I called in script 

## 2013-03-21 ENCOUNTER — Telehealth: Payer: Self-pay | Admitting: Family Medicine

## 2013-03-21 ENCOUNTER — Ambulatory Visit: Payer: BC Managed Care – PPO | Admitting: Family Medicine

## 2013-03-21 MED ORDER — HYDROCODONE-HOMATROPINE 5-1.5 MG/5ML PO SYRP: 5.0000 mL | ORAL_SOLUTION | ORAL | Status: DC | PRN

## 2013-03-21 NOTE — Telephone Encounter (Signed)
I called in script and spoke with pt. 

## 2013-03-21 NOTE — Telephone Encounter (Signed)
Call in Hydromet to take 5 ml q 4 hours prn cough, 240 ml with no rf  

## 2013-03-21 NOTE — Telephone Encounter (Signed)
Pt has a chronic cough. Has tried over the counter Robintussin. [place] Sp[place]iel it happens every year. Pt would like RX for cough med. Has 39 yr old twins, one is sick today. Cannot come in. Pls advise.  Pharm/ [facility]. [place]

## 2013-04-12 ENCOUNTER — Ambulatory Visit (INDEPENDENT_AMBULATORY_CARE_PROVIDER_SITE_OTHER): Payer: BC Managed Care – PPO | Admitting: Family Medicine

## 2013-04-12 ENCOUNTER — Encounter: Payer: Self-pay | Admitting: Family Medicine

## 2013-04-12 VITALS — BP 124/76 | HR 85 | Temp 98.3°F | Wt 166.0 lb

## 2013-04-12 DIAGNOSIS — R058 Other specified cough: Secondary | ICD-10-CM

## 2013-04-12 DIAGNOSIS — R05 Cough: Secondary | ICD-10-CM

## 2013-04-12 DIAGNOSIS — T44905A Adverse effect of unspecified drugs primarily affecting the autonomic nervous system, initial encounter: Secondary | ICD-10-CM

## 2013-04-12 DIAGNOSIS — R059 Cough, unspecified: Secondary | ICD-10-CM

## 2013-04-12 MED ORDER — HYDROCODONE-HOMATROPINE 5-1.5 MG/5ML PO SYRP: 5.0000 mL | ORAL_SOLUTION | ORAL | Status: DC | PRN

## 2013-04-12 MED ORDER — AMLODIPINE BESYLATE 5 MG PO TABS: 5.0000 mg | ORAL_TABLET | Freq: Every day | ORAL | Status: DC

## 2013-04-12 MED ORDER — METHYLPREDNISOLONE ACETATE 80 MG/ML IJ SUSP
120.0000 mg | Freq: Once | INTRAMUSCULAR | Status: AC
  Administered 2013-04-12: 120 mg via INTRAMUSCULAR

## 2013-04-12 NOTE — Addendum Note (Signed)
Addended by: Aniceto Boss A on: 04/12/2013 12:14 PM   Modules accepted: Orders

## 2013-04-12 NOTE — Progress Notes (Signed)
  Subjective:    Patient ID: Zoe Swanson, female    DOB: [birth year], 37 y.o.   MRN: [id number]  HPI Here for 2 months of frequent dry coughing which feels like a tickle in the throat. No SOB or fever.    Review of Systems  Constitutional: Negative.   HENT: Negative.   Eyes: Negative.   Respiratory: Positive for cough and choking. Negative for chest tightness, shortness of breath and wheezing.   Cardiovascular: Negative.        Objective:   Physical Exam  Constitutional: She appears well-developed and well-nourished.  Frequent coughing   HENT:  Right Ear: External ear normal.  Left Ear: External ear normal.  Nose: Nose normal.  Mouth/Throat: Oropharynx is clear and moist.  Eyes: Conjunctivae are normal.  Neck: No thyromegaly present.  Cardiovascular: Normal rate, regular rhythm, normal heart sounds and intact distal pulses.   Pulmonary/Chest: Effort normal and breath sounds normal. No respiratory distress. She has no wheezes. She has no rales.  Lymphadenopathy:    She has no cervical adenopathy.          Assessment & Plan:  Probable ACE inhibitor cough. Stop Lisinopril and switch to Amlodipine daily. Use hydromet prn. Given a steroid shot.

## 2013-05-01 ENCOUNTER — Telehealth: Payer: Self-pay | Admitting: Family Medicine

## 2013-05-01 MED ORDER — HYDROCODONE-HOMATROPINE 5-1.5 MG/5ML PO SYRP
5.0000 mL | ORAL_SOLUTION | ORAL | Status: DC | PRN
Start: 2013-05-01 — End: 2014-02-03

## 2013-05-01 NOTE — Telephone Encounter (Signed)
Call in another 240 ml bottle of Hydromet  

## 2013-05-01 NOTE — Telephone Encounter (Signed)
I called in script and left a voice message for pt. 

## 2013-05-01 NOTE — Telephone Encounter (Signed)
Pt states she still has the cough she was seen for on 6/18. T  Would like to know if a refill of cough med would be ok, or does she need to come back to see the MD.? Pharm: [facility] [place]

## 2013-05-01 NOTE — Telephone Encounter (Signed)
Please see below note

## 2013-06-23 ENCOUNTER — Other Ambulatory Visit (HOSPITAL_COMMUNITY): Payer: Self-pay | Admitting: Family Medicine

## 2013-06-23 DIAGNOSIS — M549 Dorsalgia, unspecified: Secondary | ICD-10-CM

## 2013-06-30 ENCOUNTER — Ambulatory Visit (HOSPITAL_COMMUNITY): Admission: RE | Admit: 2013-06-30 | Payer: BC Managed Care – PPO | Source: Ambulatory Visit

## 2013-07-05 ENCOUNTER — Ambulatory Visit (HOSPITAL_COMMUNITY)
Admission: RE | Admit: 2013-07-05 | Discharge: 2013-07-05 | Disposition: A | Discharge status: Home / Self Care | Payer: BC Managed Care – PPO | Source: Ambulatory Visit | Attending: Family Medicine | Admitting: Family Medicine

## 2013-07-05 DIAGNOSIS — R209 Unspecified disturbances of skin sensation: Secondary | ICD-10-CM | POA: Insufficient documentation

## 2013-07-05 DIAGNOSIS — M545 Low back pain, unspecified: Secondary | ICD-10-CM | POA: Insufficient documentation

## 2013-07-05 DIAGNOSIS — M549 Dorsalgia, unspecified: Secondary | ICD-10-CM

## 2013-07-20 HISTORY — PX: SPINAL CORD STIMULATOR IMPLANT: SHX2422

## 2013-08-03 ENCOUNTER — Telehealth: Payer: Self-pay | Admitting: Family Medicine

## 2013-08-03 MED ORDER — ZOLPIDEM TARTRATE 10 MG PO TABS: 10.0000 mg | ORAL_TABLET | Freq: Every evening | ORAL | Status: DC | PRN

## 2013-08-03 NOTE — Telephone Encounter (Signed)
Call in #[phone] with 5 rf 

## 2013-08-03 NOTE — Telephone Encounter (Signed)
I called in script 

## 2013-08-03 NOTE — Telephone Encounter (Signed)
Refill request for Zolpidem 10 mg and send to [facility].

## 2013-09-19 ENCOUNTER — Telehealth: Payer: Self-pay | Admitting: Family Medicine

## 2013-09-19 NOTE — Telephone Encounter (Signed)
Refill request for Alprazolam 1 mg take 1 po bid and send to [facility].

## 2013-09-19 NOTE — Telephone Encounter (Signed)
Call in #60 with 5 rf 

## 2013-09-20 MED ORDER — ALPRAZOLAM 1 MG PO TABS: 1.0000 mg | ORAL_TABLET | Freq: Two times a day (BID) | ORAL | Status: DC | PRN

## 2013-09-20 NOTE — Telephone Encounter (Signed)
I called in script 

## 2013-09-27 ENCOUNTER — Ambulatory Visit: Payer: BC Managed Care – PPO | Admitting: Family Medicine

## 2013-11-01 ENCOUNTER — Other Ambulatory Visit (INDEPENDENT_AMBULATORY_CARE_PROVIDER_SITE_OTHER): Payer: BC Managed Care – PPO

## 2013-11-01 DIAGNOSIS — Z0000 Encounter for general adult medical examination without abnormal findings: Secondary | ICD-10-CM

## 2013-11-01 LAB — LIPID PANEL
CHOL/HDL RATIO: 4
Cholesterol: 231 mg/dL — ABNORMAL HIGH (ref 0–200)
HDL: 62.1 mg/dL (ref >39.00)
Triglycerides: 76 mg/dL (ref 0.0–149.0)
VLDL: 15.2 mg/dL (ref 0.0–40.0)

## 2013-11-01 LAB — CBC WITH DIFFERENTIAL/PLATELET
Basophils Absolute: 0 10*3/uL (ref 0.0–0.1)
Basophils Relative: 0.6 % (ref 0.0–3.0)
EOS PCT: 5 % (ref 0.0–5.0)
Eosinophils Absolute: 0.4 10*3/uL (ref 0.0–0.7)
HEMATOCRIT: 37.8 % (ref 36.0–46.0)
HEMOGLOBIN: 12.7 g/dL (ref 12.0–15.0)
LYMPHS ABS: 2.3 10*3/uL (ref 0.7–4.0)
Lymphocytes Relative: 30.4 % (ref 12.0–46.0)
MCHC: 33.5 g/dL (ref 30.0–36.0)
MCV: 84 fl (ref 78.0–100.0)
MONO ABS: 0.5 10*3/uL (ref 0.1–1.0)
MONOS PCT: 6.8 % (ref 3.0–12.0)
NEUTROS ABS: 4.3 10*3/uL (ref 1.4–7.7)
Neutrophils Relative %: 57.2 % (ref 43.0–77.0)
Platelets: 346 10*3/uL (ref 150.0–400.0)
RBC: 4.5 Mil/uL (ref 3.87–5.11)
RDW: 15.3 % — ABNORMAL HIGH (ref 11.5–14.6)
WBC: 7.4 10*3/uL (ref 4.5–10.5)

## 2013-11-01 LAB — BASIC METABOLIC PANEL
BUN: 14 mg/dL (ref 6–23)
CHLORIDE: 105 meq/L (ref 96–112)
CO2: 24 mEq/L (ref 19–32)
Calcium: 9.7 mg/dL (ref 8.4–10.5)
Creatinine, Ser: 0.9 mg/dL (ref 0.4–1.2)
GFR: 93.17 mL/min (ref >60.00)
GLUCOSE: 82 mg/dL (ref 70–99)
POTASSIUM: 3.9 meq/L (ref 3.5–5.1)
SODIUM: 138 meq/L (ref 135–145)

## 2013-11-01 LAB — POCT URINALYSIS DIPSTICK
Bilirubin, UA: NEGATIVE
Blood, UA: NEGATIVE
GLUCOSE UA: NEGATIVE
Ketones, UA: NEGATIVE
LEUKOCYTES UA: NEGATIVE
NITRITE UA: NEGATIVE
Protein, UA: NEGATIVE
Spec Grav, UA: 1.02
UROBILINOGEN UA: 0.2
pH, UA: 8.5

## 2013-11-01 LAB — HEPATIC FUNCTION PANEL
ALT: 13 U/L (ref 0–35)
AST: 17 U/L (ref 0–37)
Albumin: 4.2 g/dL (ref 3.5–5.2)
Alkaline Phosphatase: 76 U/L (ref 39–117)
BILIRUBIN DIRECT: 0 mg/dL (ref 0.0–0.3)
BILIRUBIN TOTAL: 0.5 mg/dL (ref 0.3–1.2)
TOTAL PROTEIN: 7.8 g/dL (ref 6.0–8.3)

## 2013-11-01 LAB — LDL CHOLESTEROL, DIRECT: LDL DIRECT: 150.4 mg/dL

## 2013-11-01 LAB — TSH: TSH: 1.89 u[IU]/mL (ref 0.35–5.50)

## 2013-11-08 ENCOUNTER — Encounter: Payer: Self-pay | Admitting: Family Medicine

## 2013-11-08 ENCOUNTER — Ambulatory Visit (INDEPENDENT_AMBULATORY_CARE_PROVIDER_SITE_OTHER): Payer: BC Managed Care – PPO | Admitting: Family Medicine

## 2013-11-08 VITALS — BP 132/80 | Temp 99.2°F | Ht 65.0 in | Wt 175.0 lb

## 2013-11-08 DIAGNOSIS — Z0000 Encounter for general adult medical examination without abnormal findings: Secondary | ICD-10-CM

## 2013-11-08 DIAGNOSIS — Z23 Encounter for immunization: Secondary | ICD-10-CM

## 2013-11-08 MED ORDER — CYCLOBENZAPRINE HCL 10 MG PO TABS: 10.0000 mg | ORAL_TABLET | Freq: Three times a day (TID) | ORAL | Status: DC | PRN

## 2013-11-08 MED ORDER — DICLOFENAC SODIUM 75 MG PO TBEC: 75.0000 mg | DELAYED_RELEASE_TABLET | Freq: Two times a day (BID) | ORAL | Status: DC

## 2013-11-08 NOTE — Progress Notes (Signed)
Pre visit review using our clinic review tool, if applicable. No additional management support is needed unless otherwise documented below in the visit note. 

## 2013-11-09 ENCOUNTER — Encounter: Payer: Self-pay | Admitting: Family Medicine

## 2013-11-09 NOTE — Progress Notes (Signed)
   Subjective:    Patient ID: Zoe Swanson, female    DOB: October 12, 1976, 38 y.o.   MRN: [id number]  HPI 38 yr old female for a cpx. She has been well except for dealing with her chronic left ankle pain, after a hx of trauma. She had a trial of a spinal cord stimulator placed by Dr. Johny Sax on 06-16-13 for several weeks and this was very successful. Then on 07-20-13 she had a permanent stimulator implanted by Dr. Ventura Bruns at [facility] and she has been very pleased with the results. She still has some constant mild dull aches but no more severe pains, and she has no limitation of her activities now. We cannot do a Pap smear today since she started her menses yesterday.    Review of Systems  Constitutional: Negative.   HENT: Negative.   Eyes: Negative.   Respiratory: Negative.   Cardiovascular: Negative.   Gastrointestinal: Negative.   Genitourinary: Negative for dysuria, urgency, frequency, hematuria, flank pain, decreased urine volume, enuresis, difficulty urinating, pelvic pain and dyspareunia.  Musculoskeletal: Negative.   Skin: Negative.   Neurological: Negative.   Psychiatric/Behavioral: Negative.        Objective:   Physical Exam  Constitutional: She is oriented to person, place, and time. She appears well-developed and well-nourished. No distress.  HENT:  Head: Normocephalic and atraumatic.  Right Ear: External ear normal.  Left Ear: External ear normal.  Nose: Nose normal.  Mouth/Throat: Oropharynx is clear and moist. No oropharyngeal exudate.  Eyes: Conjunctivae and EOM are normal. Pupils are equal, round, and reactive to light. No scleral icterus.  Neck: Normal range of motion. Neck supple. No JVD present. No thyromegaly present.  Cardiovascular: Normal rate, regular rhythm, normal heart sounds and intact distal pulses.  Exam reveals no gallop and no friction rub.   No murmur heard. Pulmonary/Chest: Effort normal and breath sounds normal. No respiratory distress. She  has no wheezes. She has no rales. She exhibits no tenderness.  Abdominal: Soft. Bowel sounds are normal. She exhibits no distension and no mass. There is no tenderness. There is no rebound and no guarding.  Genitourinary: No breast swelling, tenderness, discharge or bleeding.  Musculoskeletal: Normal range of motion. She exhibits no edema and no tenderness.  Lymphadenopathy:    She has no cervical adenopathy.  Neurological: She is alert and oriented to person, place, and time. She has normal reflexes. No cranial nerve deficit. She exhibits normal muscle tone. Coordination normal.  Skin: Skin is warm and dry. No rash noted. No erythema.  Psychiatric: She has a normal mood and affect. Her behavior is normal. Judgment and thought content normal.          Assessment & Plan:  Well exam. We will do a pelvic exam sometime soon.

## 2014-02-03 ENCOUNTER — Emergency Department (HOSPITAL_COMMUNITY)
Admission: EM | Admit: 2014-02-03 | Discharge: 2014-02-04 | Disposition: A | Discharge status: Home / Self Care | Payer: BC Managed Care – PPO | Attending: Emergency Medicine | Admitting: Emergency Medicine

## 2014-02-03 ENCOUNTER — Encounter (HOSPITAL_COMMUNITY): Payer: Self-pay | Admitting: Emergency Medicine

## 2014-02-03 DIAGNOSIS — T4995XA Adverse effect of unspecified topical agent, initial encounter: Secondary | ICD-10-CM | POA: Insufficient documentation

## 2014-02-03 DIAGNOSIS — Z79899 Other long term (current) drug therapy: Secondary | ICD-10-CM | POA: Insufficient documentation

## 2014-02-03 DIAGNOSIS — G43909 Migraine, unspecified, not intractable, without status migrainosus: Secondary | ICD-10-CM | POA: Insufficient documentation

## 2014-02-03 DIAGNOSIS — R42 Dizziness and giddiness: Secondary | ICD-10-CM | POA: Insufficient documentation

## 2014-02-03 DIAGNOSIS — J45901 Unspecified asthma with (acute) exacerbation: Secondary | ICD-10-CM | POA: Insufficient documentation

## 2014-02-03 DIAGNOSIS — T782XXA Anaphylactic shock, unspecified, initial encounter: Secondary | ICD-10-CM

## 2014-02-03 DIAGNOSIS — T7840XA Allergy, unspecified, initial encounter: Secondary | ICD-10-CM

## 2014-02-03 MED ORDER — DEXAMETHASONE SODIUM PHOSPHATE 10 MG/ML IJ SOLN
8.0000 mg | Freq: Once | INTRAMUSCULAR | Status: AC
  Administered 2014-02-03: 8 mg via INTRAVENOUS
  Filled 2014-02-03: qty 1

## 2014-02-03 MED ORDER — EPINEPHRINE 0.3 MG/0.3ML IJ SOAJ: 0.3000 mg | Freq: Once | INTRAMUSCULAR | Status: DC

## 2014-02-03 MED ORDER — EPINEPHRINE 0.3 MG/0.3ML IJ SOAJ
0.3000 mg | Freq: Once | INTRAMUSCULAR | Status: AC
  Administered 2014-02-03: 0.3 mg via INTRAMUSCULAR
  Filled 2014-02-03: qty 0.3

## 2014-02-03 MED ORDER — DIPHENHYDRAMINE HCL 50 MG/ML IJ SOLN
50.0000 mg | Freq: Once | INTRAMUSCULAR | Status: AC
  Administered 2014-02-03: 50 mg via INTRAVENOUS
  Filled 2014-02-03: qty 1

## 2014-02-03 MED ORDER — SODIUM CHLORIDE 0.9 % IV BOLUS (SEPSIS)
1000.0000 mL | Freq: Once | INTRAVENOUS | Status: AC
  Administered 2014-02-03: 1000 mL via INTRAVENOUS

## 2014-02-03 MED ORDER — PREDNISONE 20 MG PO TABS: ORAL_TABLET | ORAL | Status: DC

## 2014-02-03 NOTE — ED Notes (Signed)
Pt reports onset itchy rash to both arms, around neck, and to lower L leg x 1 wk. The past couple days she started having tightness around neck, tightness in neck worsened today. Pt also reports shooting pains from R forearm into R chest. Pt states pain is intermittent.

## 2014-02-03 NOTE — ED Provider Notes (Signed)
CSN: [id number]     Arrival date & time 02/03/14  2034 History   First MD Initiated Contact with Patient 02/03/14 2039     Chief Complaint  Patient presents with  . Allergic Reaction     (Consider location/radiation/quality/duration/timing/severity/associated sxs/prior Treatment) HPI Comments: 38 yo female with allergic rhinitis, asthma, anxiety presents with worsening hives/ rash to neck/ arms bilateral.  Pt taking benadryl without much relief.  No anaphylaxis hx.  Pt feels the past 24 hrs a tightness sensation in her throat and mild difficulty breathing.  No hx of similar. No new medicines or foods.  Pt removed jewelery, has a new shirt recently.   Patient is a 38 y.o. female presenting with allergic reaction. The history is provided by the patient.  Allergic Reaction Presenting symptoms: rash     Past Medical History  Diagnosis Date  . Asthma   . Migraine   . Allergy    Past Surgical History  Procedure Laterality Date  . Ankle surgery  06-02-11    left ankle, per Dr. Rhona Raider, repair impingement  by loose body (old fibular fracture0 and repair of anterolateral ligament  . Tubal ligation    . Breast surgery  2008    left breast biopsy, benign, per Dr. Lennie Hummer   . Brain surgery      Neuromodulation   Family History  Problem Relation Age of Onset  . Arthritis    . Diabetes    . Hypertension    . Prostate cancer     History  Substance Use Topics  . Smoking status: Never Smoker   . Smokeless tobacco: Never Used  . Alcohol Use: Yes     Comment: once or twice a month   OB History   Grav Para Term Preterm Abortions TAB SAB Ect Mult Living                 Review of Systems  Constitutional: Negative for fever and chills.  Respiratory: Positive for shortness of breath.   Cardiovascular: Negative for chest pain.  Gastrointestinal: Negative for vomiting and abdominal pain.  Genitourinary: Negative for dysuria and flank pain.  Musculoskeletal: Negative for back pain,  neck pain and neck stiffness.  Skin: Positive for rash.  Neurological: Positive for light-headedness. Negative for headaches.      Allergies  Lisinopril  Home Medications   Current Outpatient Rx  Name  Route  Sig  Dispense  Refill  . albuterol (PROVENTIL) (2.5 MG/3ML) 0.083% nebulizer solution   Nebulization   Take 2.5 mg by nebulization every 4 (four) hours as needed.           Marland Kitchen albuterol (VENTOLIN HFA) 108 (90 BASE) MCG/ACT inhaler   Inhalation   Inhale 2 puffs into the lungs every 6 (six) hours as needed.           . ALPRAZolam (XANAX) 1 MG tablet   Oral   Take 1 tablet (1 mg total) by mouth 2 (two) times daily as needed.   60 tablet   5   . cyclobenzaprine (FLEXERIL) 10 MG tablet   Oral   Take 1 tablet (10 mg total) by mouth 3 (three) times daily as needed for muscle spasms.   60 tablet   11   . diphenhydrAMINE (BENADRYL) 25 MG tablet   Oral   Take 25 mg by mouth every 6 (six) hours as needed (itching).         . diphenhydrAMINE (BENADRYL) spray   Topical  Apply 1 application topically every 4 (four) hours as needed for itching (itching).         . EXPIRED: zolpidem (AMBIEN) 10 MG tablet   Oral   Take 1 tablet (10 mg total) by mouth at bedtime as needed for sleep.   30 tablet   5    BP 178/106  Pulse 92  Temp(Src) 98.4 F (36.9 C)  Resp 15  Ht 5\' 6"  (1.676 m)  Wt 170 lb (77.111 kg)  BMI 27.45 kg/m2  SpO2 100% Physical Exam  Nursing note and vitals reviewed. Constitutional: She is oriented to person, place, and time. She appears well-developed and well-nourished.  HENT:  Head: Normocephalic and atraumatic.  No trismus, uvular deviation, unilateral posterior pharyngeal edema or submandibular swelling.   Eyes: Conjunctivae are normal. Right eye exhibits no discharge. Left eye exhibits no discharge.  Neck: Normal range of motion. Neck supple. No tracheal deviation present.  Cardiovascular: Normal rate and regular rhythm.   Pulmonary/Chest:  Effort normal and breath sounds normal. Rales: no stridor.  Abdominal: Soft. She exhibits no distension. There is no tenderness. There is no guarding.  Musculoskeletal: She exhibits no edema.  Neurological: She is alert and oriented to person, place, and time.  Skin: Skin is warm. Rash noted.  Papular and hive rash diffuse arms and surrounding neck No signs of cellulitis  Psychiatric: She has a normal mood and affect.    ED Course  Procedures (including critical care time) Labs Review Labs Reviewed - No data to display Imaging Review No results found.   EKG Interpretation None      MDM   Final diagnoses:  Allergic reaction  Anaphylaxis    Worsening allergic/ rash sxs. Pt feels swelling sensation and difficulty breathing, ENT exam normal. No stridor. Discussed r/b of epi pen, will give decadron, fluids and observation for 2 hrs.   Pt observed for 3 hrs, improved, sob/ throat tightness resolved.  Fup outpt, epi pen prn, steroids.  Results and differential diagnosis were discussed with the patient. Close follow up outpatient was discussed, patient comfortable with the plan.   Filed Vitals:   02/03/14 2046 02/03/14 2052 02/03/14 2352  BP:  178/106 157/86  Pulse: 92  94  Temp: 98.4 F (36.9 C)    Resp: 15  13  Height: 5\' 6"  (1.676 m)    Weight: 170 lb (77.111 kg)    SpO2: 100%  100%         Mariea Clonts, MD 02/04/14 [phone]

## 2014-02-03 NOTE — Discharge Instructions (Signed)
Use epi pen if you develop difficulty breathing, throat/ tongue swelling. Take benadryl for itching.  Follow up for allergy testing and recheck with your doctor.  If you were given medicines take as directed.  If you are on coumadin or contraceptives realize their levels and effectiveness is altered by many different medicines.  If you have any reaction (rash, tongues swelling, other) to the medicines stop taking and see a physician.   Please follow up as directed and return to the ER or see a physician for new or worsening symptoms.  Thank you. Filed Vitals:   02/03/14 2046 02/03/14 2052  BP:  178/106  Pulse: 92   Temp: 98.4 F (36.9 C)   Resp: 15   Height: _0  (1.676 m)   Weight: 170 lb (77.111 kg)   SpO2: 100%     Anaphylactic Reaction An anaphylactic reaction is a sudden, severe allergic reaction. It affects the whole body. It can be life threatening. You may need to stay in the hospital.  [place] a medical bracelet or necklace that lists your allergy.  Carry your allergy kit or medicine shot to treat severe allergic reactions with you. These can save your life.  Do not drive until medicine from your shot has worn off, unless your doctor says it is okay.  If you have hives or a rash:  Take medicine as told by your doctor.  You may take over-the-counter antihistamine medicine.  Place cold cloths on your skin. Take baths in cool water. Avoid hot baths and hot showers. GET HELP RIGHT AWAY IF:   Your mouth is puffy (swollen), or you have trouble breathing.  You start making whistling sounds when you breathe (wheezing).  You have a tight feeling in your chest or throat.  You have a rash, hives, puffiness, or itching on your body.  You throw up (vomit) or have watery poop (diarrhea).  You feel dizzy or pass out (faint).  You think you are having an allergic reaction.  You have new symptoms. This is an emergency. Use your medicine shot or allergy kit as  told. Call your local emergency services (911 in [place] Even if you feel better after the shot, you need to go to the hospital emergency department. MAKE SURE YOU:   Understand these instructions.  Will watch your condition.  Will get help right away if you are not doing well or get worse. Document Released: 03/30/2008 Document Revised: 04/12/2012 Document Reviewed: 01/13/2012 [facility] Patient Information 2014 [place].

## 2014-02-05 ENCOUNTER — Telehealth: Payer: Self-pay | Admitting: Family Medicine

## 2014-02-05 ENCOUNTER — Ambulatory Visit (INDEPENDENT_AMBULATORY_CARE_PROVIDER_SITE_OTHER): Payer: BC Managed Care – PPO | Admitting: Internal Medicine

## 2014-02-05 ENCOUNTER — Encounter: Payer: Self-pay | Admitting: Internal Medicine

## 2014-02-05 VITALS — BP 120/80 | HR 95 | Temp 98.0°F | Resp 20 | Ht 66.0 in | Wt 171.0 lb

## 2014-02-05 DIAGNOSIS — J309 Allergic rhinitis, unspecified: Secondary | ICD-10-CM

## 2014-02-05 DIAGNOSIS — J45909 Unspecified asthma, uncomplicated: Secondary | ICD-10-CM

## 2014-02-05 DIAGNOSIS — L309 Dermatitis, unspecified: Secondary | ICD-10-CM

## 2014-02-05 DIAGNOSIS — L259 Unspecified contact dermatitis, unspecified cause: Secondary | ICD-10-CM

## 2014-02-05 MED ORDER — PREDNISONE 10 MG PO TABS: ORAL_TABLET | ORAL | Status: DC

## 2014-02-05 NOTE — Telephone Encounter (Signed)
[facility] Triage Call Report Triage Record Num: [id number] Operator: Benita Stabile Patient Name: Zoe Swanson Call Date & Time: 02/02/2014 6:00:31PM Patient Phone: [phone] PCP: Ishmael Holter. Sarajane Jews Patient Gender: Female PCP Fax : [phone] Patient DOB: [birth year] Practice Name: [facility] Reason for Call: LMP 4/15. Denies preg or BF. Caller: Darc[place]/Patient; PCP: Alysia Penna (Family Practice); CB#: [phone]; Call regarding Rash/Hives. Onset 01/29/14. Getting worse over week. Benadryl and sprays not helping. Swelling on ears, hands cracking. Right arm with hives and swelling. Neck swollen. Pt with normal allergies in spring. Itchy. Afebrile. 159/98 BP at 1740. New onset hives on Flexeril. See within 4hours. Care advice given per Hive Protocol. Pt to seek Urgent Care tonight for care. Protocol(s) Used: Hives Recommended Outcome per Protocol: Call Provider within 4 Hours Reason for Outcome: New onset of hives after beginning new prescribed, nonprescribed, or alternative/complementary medication Care Advice: Cool/tepid showers or baths may help relieve itching. If cool water alone does not relieve itching, try adding 1/2 to 1 cup baking soda or colloidal oatmeal (Aveeno) to bath water. ~ ~ Speak with provider before next dose of medication is due. ~ Should not be alone for the next 24 hours in case symptoms worsen. ~ IMMEDIATE ACTION ~ List, or take, all current prescription(s), nonprescription or alternative medication(s) to provider for evaluation. Call EMS 911 if develop signs and symptoms of anaphylaxis within minutes to several hours of exposure: severe difficulty breathing; rapid, weak or irregular pulse; pruritus, urticaria, swelling of face, lips, tongue, or throat causing tightness or difficulty swallowing; abdominal cramping, nausea, vomiting or diarrhea. ~ 04/

## 2014-02-05 NOTE — Progress Notes (Signed)
Pre-visit discussion using our clinic review tool. No additional management support is needed unless otherwise documented below in the visit note.  

## 2014-02-05 NOTE — Progress Notes (Signed)
Subjective:    Patient ID: CACHE DECOURSEY, female    DOB: [birth year], 38 y.o.   MRN: [id number]  HPI 38 year old patient who has a history of asthma and allergic rhinitis.  She was seen in the ED 2 days ago with a rash and swelling involving primarily her arms facial and neck area.  She also describes some tightness in the throat, but no active wheezing.  She was treated aggressively in ED setting for an allergic reaction with subcutaneous epinephrine, and parenteral steroids, and Benadryl.  She is somewhat better, but still having a slight rash involving her arms, which remains slightly pruritic.  She is much improved.  Again, no wheezing, or shortness of breath.  She states that she has had similar but less severe episodes over the years  Past Medical History  Diagnosis Date  . Asthma   . Migraine   . Allergy     History   Social History  . Marital Status: Married    Spouse Name: N/A    Number of Children: N/A  . Years of Education: N/A   Occupational History  . Not on file.   Social History Main Topics  . Smoking status: Never Smoker   . Smokeless tobacco: Never Used  . Alcohol Use: Yes     Comment: once or twice a month  . Drug Use: No  . Sexual Activity: Not on file   Other Topics Concern  . Not on file   Social History Narrative  . No narrative on file    Past Surgical History  Procedure Laterality Date  . Ankle surgery  06-02-11    left ankle, per Dr. Rhona Raider, repair impingement  by loose body (old fibular fracture0 and repair of anterolateral ligament  . Tubal ligation    . Breast surgery  2008    left breast biopsy, benign, per Dr. Lennie Hummer   . Brain surgery      Neuromodulation    Family History  Problem Relation Age of Onset  . Arthritis    . Diabetes    . Hypertension    . Prostate cancer      Allergies  Allergen Reactions  . Lisinopril Cough    Current Outpatient Prescriptions on File Prior to Visit  Medication Sig Dispense Refill  .  albuterol (PROVENTIL) (2.5 MG/3ML) 0.083% nebulizer solution Take 2.5 mg by nebulization every 4 (four) hours as needed.        Marland Kitchen albuterol (VENTOLIN HFA) 108 (90 BASE) MCG/ACT inhaler Inhale 2 puffs into the lungs every 6 (six) hours as needed.        . ALPRAZolam (XANAX) 1 MG tablet Take 1 tablet (1 mg total) by mouth 2 (two) times daily as needed.  60 tablet  5  . cyclobenzaprine (FLEXERIL) 10 MG tablet Take 1 tablet (10 mg total) by mouth 3 (three) times daily as needed for muscle spasms.  60 tablet  11  . diphenhydrAMINE (BENADRYL) 25 MG tablet Take 25 mg by mouth every 6 (six) hours as needed (itching).      . diphenhydrAMINE (BENADRYL) spray Apply 1 application topically every 4 (four) hours as needed for itching (itching).      . EPINEPHrine (EPI-PEN) 0.3 mg/0.3 mL SOAJ injection Inject 0.3 mLs (0.3 mg total) into the muscle once.  1 Device  1  . predniSONE (DELTASONE) 20 MG tablet 2 tabs po daily x 3 days  6 tablet  0   No current facility-administered medications on  file prior to visit.    BP 120/80  Pulse 95  Temp(Src) 98 F (36.7 C) (Oral)  Resp 20  Ht 5\' 6"  (1.676 m)  Wt 171 lb (77.565 kg)  BMI 27.61 kg/m2  SpO2 98%       Review of Systems  Constitutional: Negative.   HENT: Negative for congestion, dental problem, hearing loss, rhinorrhea, sinus pressure, sore throat and tinnitus.   Eyes: Negative for pain, discharge and visual disturbance.  Respiratory: Negative for cough and shortness of breath.   Cardiovascular: Negative for chest pain, palpitations and leg swelling.  Gastrointestinal: Negative for nausea, vomiting, abdominal pain, diarrhea, constipation, blood in stool and abdominal distention.  Genitourinary: Negative for dysuria, urgency, frequency, hematuria, flank pain, vaginal bleeding, vaginal discharge, difficulty urinating, vaginal pain and pelvic pain.  Musculoskeletal: Negative for arthralgias, gait problem and joint swelling.  Skin: Positive for rash.    Neurological: Negative for dizziness, syncope, speech difficulty, weakness, numbness and headaches.  Hematological: Negative for adenopathy.  Psychiatric/Behavioral: Negative for behavioral problems, dysphoric mood and agitation. The patient is not nervous/anxious.        Objective:   Physical Exam  Constitutional: She is oriented to person, place, and time. She appears well-developed and well-nourished.  HENT:  Head: Normocephalic.  Right Ear: External ear normal.  Left Ear: External ear normal.  Mouth/Throat: Oropharynx is clear and moist.  Eyes: Conjunctivae and EOM are normal. Pupils are equal, round, and reactive to light.  Neck: Normal range of motion. Neck supple. No thyromegaly present.  Cardiovascular: Normal rate, regular rhythm, normal heart sounds and intact distal pulses.   Pulmonary/Chest: Effort normal and breath sounds normal. No respiratory distress. She has no wheezes. She has no rales.  Abdominal: Soft. Bowel sounds are normal. She exhibits no mass. There is no tenderness.  Musculoskeletal: Normal range of motion.  Lymphadenopathy:    She has no cervical adenopathy.  Neurological: She is alert and oriented to person, place, and time.  Skin: Rash noted.  Patchy areas of erythema involving the arms.  There was some subcutaneous edematous changes and a cobblestoning type rash over the lower arms and hands. Postinflammatory scaling was present in the preauricular areas and along the scalp line  Psychiatric: She has a normal mood and affect. Her behavior is normal.          Assessment & Plan:   Resolving eczematous dermatitis.  Patient is still symptomatic.  Will continue a prednisone taper oral antihistamines. Information concerning atopic dermatitis, dispensed She wishes to see allergy medicine.  We will refer

## 2014-02-05 NOTE — Patient Instructions (Signed)
Prednisone taper as discussed  Continue  An oral antihistamine, such as Zyrtec or Allegra once daily  Call or return to clinic prn if these symptoms worsen or fail to improve as anticipated. Eczema Eczema, also called atopic dermatitis, is a skin disorder that causes inflammation of the skin. It causes a red rash and dry, scaly skin. The skin becomes very itchy. Eczema is generally worse during the cooler winter months and often improves with the warmth of summer. Eczema usually starts showing signs in infancy. Some children outgrow eczema, but it may last through adulthood.  CAUSES  The exact cause of eczema is not known, but it appears to run in families. People with eczema often have a family history of eczema, allergies, asthma, or hay fever. Eczema is not contagious. Flare-ups of the condition may be caused by:   Contact with something you are sensitive or allergic to.   Stress. SIGNS AND SYMPTOMS  Dry, scaly skin.   Red, itchy rash.   Itchiness. This may occur before the skin rash and may be very intense.  DIAGNOSIS  The diagnosis of eczema is usually made based on symptoms and medical history. TREATMENT  Eczema cannot be cured, but symptoms usually can be controlled with treatment and other strategies. A treatment plan might include:  Controlling the itching and scratching.   Use over-the-counter antihistamines as directed for itching. This is especially useful at night when the itching tends to be worse.   Use over-the-counter steroid creams as directed for itching.   Avoid scratching. Scratching makes the rash and itching worse. It may also result in a skin infection (impetigo) due to a break in the skin caused by scratching.   Keeping the skin well moisturized with creams every day. This will seal in moisture and help prevent dryness. Lotions that contain alcohol and water should be avoided because they can dry the skin.   Limiting exposure to things that you  are sensitive or allergic to (allergens).   Recognizing situations that cause stress.   Developing a plan to manage stress.  HOME CARE INSTRUCTIONS   Only take over-the-counter or prescription medicines as directed by your health care provider.   Do not use anything on the skin without checking with your health care provider.   Keep baths or showers short (5 minutes) in warm (not hot) water. Use mild cleansers for bathing. These should be unscented. You may add nonperfumed bath oil to the bath water. It is best to avoid soap and bubble bath.   Immediately after a bath or shower, when the skin is still damp, apply a moisturizing ointment to the entire body. This ointment should be a petroleum ointment. This will seal in moisture and help prevent dryness. The thicker the ointment, the better. These should be unscented.   Keep fingernails cut short. Children with eczema may need to wear soft gloves or mittens at night after applying an ointment.   Dress in clothes made of cotton or cotton blends. Dress lightly, because heat increases itching.   A child with eczema should stay away from anyone with fever blisters or cold sores. The virus that causes fever blisters (herpes simplex) can cause a serious skin infection in children with eczema. SEEK MEDICAL CARE IF:   Your itching interferes with sleep.   Your rash gets worse or is not better within 1 week after starting treatment.   You see pus or soft yellow scabs in the rash area.   You have a  fever.   You have a rash flare-up after contact with someone who has fever blisters.  Document Released: 10/09/2000 Document Revised: 08/02/2013 Document Reviewed: 05/15/2013 [facility] Patient Information 2014 [place].

## 2014-02-14 ENCOUNTER — Telehealth: Payer: Self-pay | Admitting: Family Medicine

## 2014-02-14 NOTE — Telephone Encounter (Signed)
[facility] - [place], [place] - [address] is requesting re-fill on zolpidem (AMBIEN) 10 MG tablet ° °

## 2014-02-15 NOTE — Telephone Encounter (Signed)
Call in #[phone] with 5 rf 

## 2014-02-16 MED ORDER — ZOLPIDEM TARTRATE 10 MG PO TABS: 10.0000 mg | ORAL_TABLET | Freq: Every evening | ORAL | Status: DC | PRN

## 2014-02-16 NOTE — Telephone Encounter (Signed)
I called in script 

## 2014-02-26 ENCOUNTER — Other Ambulatory Visit: Payer: Self-pay | Admitting: Allergy

## 2014-02-26 ENCOUNTER — Ambulatory Visit
Admission: RE | Admit: 2014-02-26 | Discharge: 2014-02-26 | Disposition: A | Discharge status: Home / Self Care | Payer: BC Managed Care – PPO | Source: Ambulatory Visit | Attending: Allergy | Admitting: Allergy

## 2014-02-26 DIAGNOSIS — J45909 Unspecified asthma, uncomplicated: Secondary | ICD-10-CM

## 2014-03-21 ENCOUNTER — Telehealth: Payer: Self-pay | Admitting: Family Medicine

## 2014-03-21 NOTE — Telephone Encounter (Signed)
[facility] REQUESTING RE-FILL ON ALPRAZolam (XANAX) 1 MG tablet

## 2014-03-21 NOTE — Telephone Encounter (Signed)
Call in #[phone] with no refills. She needs testing and a contract

## 2014-03-23 MED ORDER — ALPRAZOLAM 1 MG PO TABS: 1.0000 mg | ORAL_TABLET | Freq: Two times a day (BID) | ORAL | Status: DC | PRN

## 2014-03-23 NOTE — Telephone Encounter (Signed)
I called in script and spoke with pt. 

## 2014-04-13 ENCOUNTER — Encounter: Payer: Self-pay | Admitting: Family Medicine

## 2014-05-17 ENCOUNTER — Encounter (HOSPITAL_COMMUNITY): Payer: Self-pay | Admitting: Emergency Medicine

## 2014-05-17 ENCOUNTER — Emergency Department (HOSPITAL_COMMUNITY)
Admission: EM | Admit: 2014-05-17 | Discharge: 2014-05-17 | Disposition: A | Discharge status: Home / Self Care | Payer: BC Managed Care – PPO | Attending: Emergency Medicine | Admitting: Emergency Medicine

## 2014-05-17 ENCOUNTER — Emergency Department (HOSPITAL_COMMUNITY): Payer: BC Managed Care – PPO

## 2014-05-17 ENCOUNTER — Telehealth: Payer: Self-pay | Admitting: Family Medicine

## 2014-05-17 DIAGNOSIS — G43909 Migraine, unspecified, not intractable, without status migrainosus: Secondary | ICD-10-CM | POA: Insufficient documentation

## 2014-05-17 DIAGNOSIS — S9002XA Contusion of left ankle, initial encounter: Secondary | ICD-10-CM

## 2014-05-17 DIAGNOSIS — IMO0002 Reserved for concepts with insufficient information to code with codable children: Secondary | ICD-10-CM | POA: Insufficient documentation

## 2014-05-17 DIAGNOSIS — Y939 Activity, unspecified: Secondary | ICD-10-CM | POA: Insufficient documentation

## 2014-05-17 DIAGNOSIS — Z79899 Other long term (current) drug therapy: Secondary | ICD-10-CM | POA: Insufficient documentation

## 2014-05-17 DIAGNOSIS — Y929 Unspecified place or not applicable: Secondary | ICD-10-CM | POA: Insufficient documentation

## 2014-05-17 DIAGNOSIS — Z9889 Other specified postprocedural states: Secondary | ICD-10-CM | POA: Insufficient documentation

## 2014-05-17 DIAGNOSIS — S9000XA Contusion of unspecified ankle, initial encounter: Secondary | ICD-10-CM | POA: Insufficient documentation

## 2014-05-17 DIAGNOSIS — J45909 Unspecified asthma, uncomplicated: Secondary | ICD-10-CM | POA: Insufficient documentation

## 2014-05-17 NOTE — ED Notes (Addendum)
Pt reports that about two hours ago she hit her left ankle against a metal bar used for lifting weights. Pt reports "12/10" left ankle pain. Pt reports having previous surgery on the ankle and has a neuro-stimulator in place which is used for CRPS. Pt is ambulatory to exam room unassisted, vitals are WDL, and pt is in NAD. Pt has strong pedal pulse and capillary refill is less than 2 seconds.

## 2014-05-17 NOTE — Telephone Encounter (Signed)
[facility], [place] is requesting re-fill on ALPRAZolam (XANAX) 1 MG tablet

## 2014-05-17 NOTE — ED Notes (Signed)
Pt to radiology.

## 2014-05-17 NOTE — Discharge Instructions (Signed)
Contusion °A contusion is the result of an injury to the skin and underlying tissues and is usually caused by direct trauma. The injury results in the appearance of a bruise on the skin overlying the injured tissues. Contusions cause rupture and bleeding of the small capillaries and blood vessels and affect function, because the bleeding infiltrates muscles, tendons, nerves, or other soft tissues.  °SYMPTOMS  °· Swelling and often a hard lump in the injured area, either superficial or deep. °· Pain and tenderness over the area of the contusion. °· Feeling of firmness when pressure is exerted over the contusion. °· Discoloration under the skin, beginning with redness and progressing to the characteristic "black and blue" bruise. °CAUSES  °A contusion is typically the result of direct trauma. This is often by a blunt object.  °RISK INCREASES WITH: °· Sports that have a high likelihood of trauma (football, boxing, ice hockey, soccer, field hockey, martial arts, basketball, and baseball). °· Sports that make falling from a height likely (high-jumping, pole-vaulting, skating, or gymnastics). °· Any bleeding disorder (hemophilia) or taking medications that affect clotting (aspirin, nonsteroidal anti-inflammatory medications, or warfarin [Coumadin]). °· Inadequate protection of exposed areas during contact sports. °PREVENTION °· Maintain physical fitness: °¨ Joint and muscle flexibility. °¨ Strength and endurance. °¨ Coordination. °· Wear proper protective equipment. Make sure it fits correctly. °PROGNOSIS  °Contusions typically heal without any complications. Healing time varies with the severity of injury and intake of medications that affect clotting. Contusions usually heal in 1 to 4 weeks. °RELATED COMPLICATIONS  °· Damage to nearby nerves or blood vessels, causing numbness, coldness, or paleness. °· Compartment syndrome. °· Bleeding into the soft tissues that leads to disability. °· Infiltrative-type bleeding,  leading to the calcification and impaired function of the injured muscle (rare). °· Prolonged healing time if usual activities are resumed too soon. °· Infection if the skin over the injury site is broken. °· Fracture of the bone underlying the contusion. °· Stiffness in the joint where the injured muscle crosses. °TREATMENT  °Treatment initially consists of resting the injured area as well as medication and ice to reduce inflammation. The use of a compression bandage may also be helpful in minimizing inflammation. As pain diminishes and movement is tolerated, the joint where the affected muscle crosses should be moved to prevent stiffness and the shortening (contracture) of the joint. Movement of the joint should begin as soon as possible. It is also important to work on maintaining strength within the affected muscles. °Occasionally, extra padding over the area of contusion may be recommended before returning to sports, particularly if re-injury is likely.  °MEDICATION  °· If pain relief is necessary these medications are often recommended: °¨ Nonsteroidal anti-inflammatory medications, such as aspirin and ibuprofen. °¨ Other minor pain relievers, such as acetaminophen, are often recommended. °· Prescription pain relievers may be given by your caregiver. Use only as directed and only as much as you need. °HEAT AND COLD °· Cold treatment (icing) relieves pain and reduces inflammation. Cold treatment should be applied for 10 to 15 minutes every 2 to 3 hours for inflammation and pain and immediately after any activity that aggravates your symptoms. Use ice packs or an ice massage. (To do an ice massage fill a large styrofoam cup with water and freeze. Tear a small amount of foam from the top so ice protrudes. Massage ice firmly over the injured area in a circle about the size of a softball.) °· Heat treatment may be used prior to   performing the stretching and strengthening activities prescribed by your caregiver,  physical therapist, or athletic trainer. Use a heat pack or a warm soak. SEEK MEDICAL CARE IF:   Symptoms get worse or do not improve despite treatment in a few days.  You have difficulty moving a joint.  Any extremity becomes extremely painful, numb, pale, or cool (This is an emergency!).  Medication produces any side effects (bleeding, upset stomach, or allergic reaction).  Signs of infection (drainage from skin, headache, muscle aches, dizziness, fever, or general ill feeling) occur if skin was broken. Document Released: 10/12/2005 Document Revised: 01/04/2012 Document Reviewed: 01/24/2009 [facility] Patient Information 2015 [place], [place]. This information is not intended to replace advice given to you by your health care provider. Make sure you discuss any questions you have with your health care provider.  Cryotherapy Cryotherapy is when you put ice on your injury. Ice helps lessen pain and puffiness (swelling) after an injury. Ice works the best when you start using it in the first 24 to 48 hours after an injury. HOME CARE  Put a dry or damp towel between the ice pack and your skin.  You may press gently on the ice pack.  Leave the ice on for no more than 10 to 20 minutes at a time.  Check your skin after 5 minutes to make sure your skin is okay.  Rest at least 20 minutes between ice pack uses.  Stop using ice when your skin loses feeling (numbness).  Do not use ice on someone who cannot tell you when it hurts. This includes small children and people with memory problems (dementia). GET HELP RIGHT AWAY IF:  You have white spots on your skin.  Your skin turns blue or pale.  Your skin feels waxy or hard.  Your puffiness gets worse. MAKE SURE YOU:   Understand these instructions.  Will watch your condition.  Will get help right away if you are not doing well or get worse. Document Released: 03/30/2008 Document Revised: 01/04/2012 Document Reviewed:  06/04/2011 [facility] Patient Information 2015 [place], [place]. This information is not intended to replace advice given to you by your health care provider. Make sure you discuss any questions you have with your health care provider.  Cryotherapy Cryotherapy is when you put ice on your injury. Ice helps lessen pain and puffiness (swelling) after an injury. Ice works the best when you start using it in the first 24 to 48 hours after an injury. HOME CARE  Put a dry or damp towel between the ice pack and your skin.  You may press gently on the ice pack.  Leave the ice on for no more than 10 to 20 minutes at a time.  Check your skin after 5 minutes to make sure your skin is okay.  Rest at least 20 minutes between ice pack uses.  Stop using ice when your skin loses feeling (numbness).  Do not use ice on someone who cannot tell you when it hurts. This includes small children and people with memory problems (dementia). GET HELP RIGHT AWAY IF:  You have white spots on your skin.  Your skin turns blue or pale.  Your skin feels waxy or hard.  Your puffiness gets worse. MAKE SURE YOU:   Understand these instructions.  Will watch your condition.  Will get help right away if you are not doing well or get worse. Document Released: 03/30/2008 Document Revised: 01/04/2012 Document Reviewed: 06/04/2011 [facility] Patient Information 2015 [place], [place]. This  information is not intended to replace advice given to you by your health care provider. Make sure you discuss any questions you have with your health care provider. ° °

## 2014-05-17 NOTE — ED Provider Notes (Signed)
Medical screening examination/treatment/procedure(s) were performed by non-physician practitioner and as supervising physician I was immediately available for consultation/collaboration.  Leota Jacobsen, MD 05/17/14 2136

## 2014-05-17 NOTE — ED Provider Notes (Signed)
CSN: [id number]     Arrival date & time 05/17/14  1909 History  This chart was scribed for Junius Creamer, NP, working with Leota Jacobsen, MD by Steva Colder, ED Scribe. The patient was seen in room WTR6/WTR6 at 8:15 PM.      Chief Complaint  Patient presents with  . Ankle Pain     The history is provided by the patient. No language interpreter was used.   HPI Comments: Zoe Swanson is a 38 y.o. female who presents to the Emergency Department complaining of left ankle pain onset 2 hours ago. She states that she hit her ankle against a metal bar that is used for lifting weights. She rates her pain as 12/10. She states that she has had a previous surgery on Sept 25th on her left ankle. 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 She states that the doctor that she follows for her neuro-stimulator is in [place]. She states that she had the surgery because she broke her foot in college and it never properly healed. She denies any other associated symptoms.    Past Medical History  Diagnosis Date  . Asthma   . Migraine   . Allergy    Past Surgical History  Procedure Laterality Date  . Ankle surgery  06-02-11    left ankle, per Dr. Rhona Raider, repair impingement  by loose body (old fibular fracture0 and repair of anterolateral ligament  . Tubal ligation    . Breast surgery  2008    left breast biopsy, benign, per Dr. Lennie Hummer   . Brain surgery      Neuromodulation   Family History  Problem Relation Age of Onset  . Arthritis    . Diabetes    . Hypertension    . Prostate cancer     History  Substance Use Topics  . Smoking status: Never Smoker   . Smokeless tobacco: Never Used  . Alcohol Use: Yes     Comment: once or twice a month   OB History   Grav Para Term Preterm Abortions TAB SAB Ect Mult Living                 Review of Systems  Musculoskeletal:       Left ankle pain.      Allergies  Lisinopril; Molds & smuts; and Pollen  extract  Home Medications   Prior to Admission medications   Medication Sig Start Date End Date Taking? Authorizing Provider  albuterol (PROVENTIL) (2.5 MG/3ML) 0.083% nebulizer solution Take 2.5 mg by nebulization every 4 (four) hours as needed for wheezing or shortness of breath.    Yes Historical Provider, MD  albuterol (VENTOLIN HFA) 108 (90 BASE) MCG/ACT inhaler Inhale 2 puffs into the lungs every 6 (six) hours as needed for wheezing or shortness of breath.    Yes Historical Provider, MD  ALPRAZolam Duanne Moron) 1 MG tablet Take 1 mg by mouth 2 (two) times daily as needed for anxiety. 03/23/14  Yes Laurey Morale, MD  cyclobenzaprine (FLEXERIL) 10 MG tablet Take 1 tablet (10 mg total) by mouth 3 (three) times daily as needed for muscle spasms. 11/08/13  Yes Laurey Morale, MD  EPINEPHrine 0.3 mg/0.3 mL IJ SOAJ injection Inject 0.3 mg into the muscle once. 02/03/14  Yes Mariea Clonts, MD  zolpidem (AMBIEN) 10 MG tablet Take 10 mg by mouth at bedtime as needed for sleep.   Yes Historical Provider,  MD   BP 138/86  Pulse 98  Temp(Src) 98.2 F (36.8 C) (Oral)  Resp 18  SpO2 100%  LMP 05/17/2014  Physical Exam  Nursing note and vitals reviewed. Constitutional: She is oriented to person, place, and time. She appears well-developed and well-nourished. No distress.  HENT:  Head: Normocephalic and atraumatic.  Eyes: EOM are normal.  Neck: Neck supple. No tracheal deviation present.  Cardiovascular: Normal rate.   Pulmonary/Chest: Effort normal. No respiratory distress.  Musculoskeletal: Normal range of motion.  Good pulses. Slight swelling in the lateral malleolus.  Neurological: She is alert and oriented to person, place, and time.  Skin: Skin is warm and dry.  Psychiatric: She has a normal mood and affect. Her behavior is normal.    ED Course  Procedures (including critical care time) DIAGNOSTIC STUDIES: Oxygen Saturation is 100% on room air, normal by my interpretation.     COORDINATION OF CARE: 8:17 PM-Discussed treatment plan which includes Follow up with Orthopedist for Chronic pain syndrome, Ice, Elevation, and Anti-Inflammatory with pt at bedside and pt agreed to plan.   Labs Review Labs Reviewed - No data to display  Imaging Review Dg Ankle Complete Left  05/17/2014   CLINICAL DATA:  Blow to the left ankle, pain.  EXAM: LEFT ANKLE COMPLETE - 3+ VIEW  COMPARISON:  MRI left ankle 05/16/2011.  FINDINGS: Imaged bones, joints and soft tissues appear normal.  IMPRESSION: Negative exam.   Electronically Signed   By: Inge Rise M.D.   On: 05/17/2014 19:43     EKG Interpretation None      MDM   Final diagnoses:  Ankle contusion, left, initial encounter       I personally performed the services described in this documentation, which was scribed in my presence. The recorded information has been reviewed and is accurate.    Garald Balding, NP 05/17/14 2032

## 2014-05-18 MED ORDER — ALPRAZOLAM 1 MG PO TABS: 1.0000 mg | ORAL_TABLET | Freq: Two times a day (BID) | ORAL | Status: DC | PRN

## 2014-05-18 NOTE — Telephone Encounter (Signed)
I called in script 

## 2014-05-18 NOTE — Telephone Encounter (Signed)
Call in #60 with 5 rf 

## 2014-06-14 ENCOUNTER — Telehealth: Payer: Self-pay | Admitting: Family Medicine

## 2014-06-14 NOTE — Telephone Encounter (Signed)
Patient Information:  Caller Name: Keyani  Phone: [phone]  Patient: Zoe Swanson, Zoe Swanson  Gender: Female  DOB: [birth year]  Age: 38 Years  PCP: Alysia Penna [facility])  Pregnant: No  Office Follow Up:  Does the office need to follow up with this patient?: Yes  Instructions For The Office: Patient declined appointment for 8/20; requests one for late afternoon 06/15/14.  Advised Dr. Sarajane Jews is not in the afternoon of 06/15/14.   Please follow up with her regarding weaning from Xanax or scheduling appointment.  Patient request refill for Ambien or Xanax so she can get some sleep; uses [employer].   Symptoms  Reason For Call & Symptoms: Patient reports she wants to wean off Xanax; has been on it for 2 years.  States she decided this estimated 06/07/14; she wants to try natural alternatives.  She states the electrical impulses  from neuromodulator are annoying and makes it more difficult for her to sleep.  Reviewed Health History In EMR: Yes  Reviewed Medications In EMR: Yes  Reviewed Allergies In EMR: Yes  Reviewed Surgeries / Procedures: Yes  Date of Onset of Symptoms: Unknown  Treatments Tried: Tried taking medication every other day- does ok; she did without med for a week and states pain is terrible.  Treatments Tried Worked: No OB / GYN:  LMP: 06/04/2014  Guideline(s) Used:  No Protocol Available - Sick Adult  Disposition Per Guideline:   See Today or Tomorrow in Office  Reason For Disposition Reached:   Nursing judgment  Advice Given:  Call Back If:  New symptoms develop  You become worse.  Patient Refused Recommendation:  Patient Refused Care Advice  Started new job on 06/11/14; states she cannot miss work.

## 2014-06-14 NOTE — Telephone Encounter (Signed)
We really need an OV to talk about all these issues. I suggest she see [place] either tomorrow morning or on Monday

## 2014-06-14 NOTE — Telephone Encounter (Signed)
Please call pt to schedule f/u with Dr. Sarajane Jews tomorrow morning or Monday to discuss medication

## 2014-06-14 NOTE — Telephone Encounter (Signed)
Please advise 

## 2014-06-18 ENCOUNTER — Ambulatory Visit (INDEPENDENT_AMBULATORY_CARE_PROVIDER_SITE_OTHER): Payer: BC Managed Care – PPO | Admitting: Family Medicine

## 2014-06-18 ENCOUNTER — Encounter: Payer: Self-pay | Admitting: Family Medicine

## 2014-06-18 VITALS — BP 147/99 | HR 100 | Temp 99.7°F | Ht 66.0 in | Wt 172.0 lb

## 2014-06-18 DIAGNOSIS — I10 Essential (primary) hypertension: Secondary | ICD-10-CM

## 2014-06-18 DIAGNOSIS — F411 Generalized anxiety disorder: Secondary | ICD-10-CM

## 2014-06-18 MED ORDER — ALPRAZOLAM 0.5 MG PO TABS: 0.5000 mg | ORAL_TABLET | Freq: Two times a day (BID) | ORAL | Status: DC

## 2014-06-18 NOTE — Progress Notes (Signed)
Pre visit review using our clinic review tool, if applicable. No additional management support is needed unless otherwise documented below in the visit note. 

## 2014-06-18 NOTE — Progress Notes (Signed)
   Subjective:    Patient ID: Zoe Swanson, female    DOB: 05-Mar-1976, 38 y.o.   MRN: [id number]  HPI Here to discuss possibly weaning off Xanax. She had been doing quite well while taking this twice a day but she decided to suddenly stop this 2 weeks ago. Since then she has felt very anxious and shaky, and her voice has been quivering. Some heart racing and SOB as well.    Review of Systems  Constitutional: Negative.   Respiratory: Positive for shortness of breath. Negative for cough and wheezing.   Cardiovascular: Positive for palpitations. Negative for chest pain and leg swelling.  Neurological: Negative.        Objective:   Physical Exam  Constitutional: She is oriented to person, place, and time. She appears well-developed and well-nourished. No distress.  Cardiovascular: Normal rate, regular rhythm, normal heart sounds and intact distal pulses.   Pulmonary/Chest: Effort normal and breath sounds normal.  Neurological: She is alert and oriented to person, place, and time.  Psychiatric: She has a normal mood and affect. Her behavior is normal. Thought content normal.          Assessment & Plan:  We will help her to wean off Xanax slowly. She will get back on this BID but we will reduce the dose to 0.5 mg and she is to take this regularly and NOT prn. She will do this for 2 weeks, then take 1/2 tablet BID for 2 weeks, then follow up.

## 2014-06-19 ENCOUNTER — Telehealth: Payer: Self-pay | Admitting: Family Medicine

## 2014-06-19 NOTE — Telephone Encounter (Signed)
Pt was seen yesterday and has yeast infection call med into [facility] [address]

## 2014-06-20 ENCOUNTER — Ambulatory Visit (INDEPENDENT_AMBULATORY_CARE_PROVIDER_SITE_OTHER): Payer: BC Managed Care – PPO | Admitting: Physician Assistant

## 2014-06-20 ENCOUNTER — Encounter: Payer: Self-pay | Admitting: Physician Assistant

## 2014-06-20 VITALS — BP 134/90 | HR 72 | Temp 98.5°F | Resp 18 | Wt 175.2 lb

## 2014-06-20 DIAGNOSIS — R2232 Localized swelling, mass and lump, left upper limb: Secondary | ICD-10-CM

## 2014-06-20 DIAGNOSIS — B373 Candidiasis of vulva and vagina: Secondary | ICD-10-CM

## 2014-06-20 DIAGNOSIS — R229 Localized swelling, mass and lump, unspecified: Secondary | ICD-10-CM

## 2014-06-20 DIAGNOSIS — B3731 Acute candidiasis of vulva and vagina: Secondary | ICD-10-CM

## 2014-06-20 MED ORDER — FLUCONAZOLE 150 MG PO TABS: 150.0000 mg | ORAL_TABLET | Freq: Once | ORAL | Status: DC

## 2014-06-20 NOTE — Progress Notes (Signed)
Pre visit review using our clinic review tool, if applicable. No additional management support is needed unless otherwise documented below in the visit note. 

## 2014-06-20 NOTE — Patient Instructions (Addendum)
You will be called to schedule an appointment with [facility] to evaluate the lump under your armpit.  Diflucan 1 pill once to treat vaginal yeast infection.  If emergency symptoms discussed during visit developed, seek medical attention immediately.  Followup as needed, or for worsening or persistent symptoms despite treatment.

## 2014-06-20 NOTE — Progress Notes (Signed)
Subjective:    Patient ID: Zoe Swanson, female    DOB: February 22, 1976, 38 y.o.   MRN: [id number]  HPI Patient is a 38 y.o. female presenting for lump under armpit. PT noticed a lump underneath her left armpit yesterday. She believes this is the size of a pea. She states that this does not hurt, no warmth, not squishy, nothing has drained out of the area, no itching. She has not used any new detergents or deodorants. She is concerned because this is the same side that she has had biopsies of her breast in the past due to drainage from her nipple, all of which came back benign. She states that she has also felt fatigued for the past year, however she does not know if this is related. Patient denies fevers, chills, nausea, vomiting, diarrhea, shortness of breath, chest pain, headache, syncope, unexpected weight change, change in activity level, or diaphoresis.   The pt also has been experiencing a yeast infection for the past 3 or 4 days. She states this is similar in symptoms to previous yeast infection, which resolved with oral diflucan.    Review of Systems As per HPI and are otherwise negative.   Past Medical History  Diagnosis Date  . Asthma   . Migraine   . Allergy     History   Social History  . Marital Status: Married    Spouse Name: N/A    Number of Children: N/A  . Years of Education: N/A   Occupational History  . Not on file.   Social History Main Topics  . Smoking status: Never Smoker   . Smokeless tobacco: Never Used  . Alcohol Use: Yes     Comment: occ  . Drug Use: No  . Sexual Activity: Not on file   Other Topics Concern  . Not on file   Social History Narrative  . No narrative on file    Past Surgical History  Procedure Laterality Date  . Ankle surgery  06-02-11    left ankle, per Dr. Rhona Raider, repair impingement  by loose body (old fibular fracture0 and repair of anterolateral ligament  . Tubal ligation    . Breast surgery  2008    left breast  biopsy, benign, per Dr. Lennie Hummer   . Brain surgery      Neuromodulation    Family History  Problem Relation Age of Onset  . Arthritis    . Diabetes    . Hypertension    . Prostate cancer      Allergies  Allergen Reactions  . Lisinopril Cough  . Molds & Smuts     Per allergy test.   . Pollen Extract     Per allergy test.     Current Outpatient Prescriptions on File Prior to Visit  Medication Sig Dispense Refill  . albuterol (PROVENTIL) (2.5 MG/3ML) 0.083% nebulizer solution Take 2.5 mg by nebulization every 4 (four) hours as needed for wheezing or shortness of breath.       Marland Kitchen albuterol (VENTOLIN HFA) 108 (90 BASE) MCG/ACT inhaler Inhale 2 puffs into the lungs every 6 (six) hours as needed for wheezing or shortness of breath.       . ALPRAZolam (XANAX) 0.5 MG tablet Take 1 tablet (0.5 mg total) by mouth 2 (two) times daily.  60 tablet  2  . amLODipine (NORVASC) 5 MG tablet Take 5 mg by mouth daily.      . cyclobenzaprine (FLEXERIL) 10 MG tablet Take 1  tablet (10 mg total) by mouth 3 (three) times daily as needed for muscle spasms.  60 tablet  11  . EPINEPHrine 0.3 mg/0.3 mL IJ SOAJ injection Inject 0.3 mg into the muscle once.      Marland Kitchen zolpidem (AMBIEN) 10 MG tablet Take 10 mg by mouth at bedtime as needed for sleep.       No current facility-administered medications on file prior to visit.    EXAM: BP 134/90  Pulse 72  Temp(Src) 98.5 F (36.9 C) (Oral)  Resp 18  Wt 175 lb 3.2 oz (79.47 kg)     Objective:   Physical Exam  Nursing note and vitals reviewed. Constitutional: She is oriented to person, place, and time. She appears well-developed and well-nourished. No distress.  HENT:  Head: Normocephalic and atraumatic.  Eyes: Conjunctivae and EOM are normal. Pupils are equal, round, and reactive to light.  Cardiovascular: Normal rate, regular rhythm and intact distal pulses.   Pulmonary/Chest: Effort normal and breath sounds normal. No respiratory distress. She exhibits  no tenderness.  Left breast/axilla exam: there is a solitary, firm, superficial lump, approximately the size of a pea, in the left axilla, this is not freely movable. Otherwise, exam normal. Right breast/axilla exam: normal.   Neurological: She is alert and oriented to person, place, and time.  Skin: Skin is warm and dry. No rash noted. She is not diaphoretic. No erythema. No pallor.  Psychiatric: She has a normal mood and affect. Her behavior is normal. Judgment and thought content normal.     Lab Results  Component Value Date   WBC 7.4 11/01/2013   HGB 12.7 11/01/2013   HCT 37.8 11/01/2013   PLT 346.0 11/01/2013   GLUCOSE 82 11/01/2013   CHOL 231* 11/01/2013   TRIG 76.0 11/01/2013   HDL 62.10 11/01/2013   LDLDIRECT 150.4 11/01/2013   LDLCALC 112* 03/25/2012   ALT 13 11/01/2013   AST 17 11/01/2013   NA 138 11/01/2013   K 3.9 11/01/2013   CL 105 11/01/2013   CREATININE 0.9 11/01/2013   BUN 14 11/01/2013   CO2 24 11/01/2013   TSH 1.89 11/01/2013   HGBA1C 5.5 10/12/2012        Assessment & Plan:  Alani was seen today for lump on left armpit.  Diagnoses and associated orders for this visit:  Lump in armpit, left Comments: Superficial. Possibly Cyst. Pt wants referral to surgery due to hx of breast biopsy and fam hx of breast cancer. Will refer. - Ambulatory referral to General Surgery  Vaginal yeast infection Comments: Will treat with Diflucan. - fluconazole (DIFLUCAN) 150 MG tablet; Take 1 tablet (150 mg total) by mouth once.    Return precautions provided.  Plan to follow up as needed, or for worsening or persistent symptoms despite treatment.  Patient Instructions  You will be called to schedule an appointment with [place] surgery to evaluate the lump under your armpit.  Diflucan 1 pill once to treat vaginal yeast infection.  If emergency symptoms discussed during visit developed, seek medical attention immediately.  Followup as needed, or for worsening or persistent symptoms despite  treatment.

## 2014-06-21 NOTE — Telephone Encounter (Signed)
She was seen for this on 06-20-14

## 2014-07-06 ENCOUNTER — Ambulatory Visit (INDEPENDENT_AMBULATORY_CARE_PROVIDER_SITE_OTHER): Payer: BC Managed Care – PPO | Admitting: Surgery

## 2014-07-13 ENCOUNTER — Other Ambulatory Visit (INDEPENDENT_AMBULATORY_CARE_PROVIDER_SITE_OTHER): Payer: Self-pay | Admitting: *Deleted

## 2014-07-13 ENCOUNTER — Other Ambulatory Visit (INDEPENDENT_AMBULATORY_CARE_PROVIDER_SITE_OTHER): Payer: Self-pay

## 2014-07-13 ENCOUNTER — Other Ambulatory Visit (INDEPENDENT_AMBULATORY_CARE_PROVIDER_SITE_OTHER): Payer: Self-pay | Admitting: General Surgery

## 2014-07-13 DIAGNOSIS — N6452 Nipple discharge: Secondary | ICD-10-CM

## 2014-07-13 DIAGNOSIS — R2232 Localized swelling, mass and lump, left upper limb: Secondary | ICD-10-CM

## 2014-07-17 ENCOUNTER — Encounter: Payer: Self-pay | Admitting: Family Medicine

## 2014-07-17 ENCOUNTER — Ambulatory Visit (INDEPENDENT_AMBULATORY_CARE_PROVIDER_SITE_OTHER): Payer: BC Managed Care – PPO | Admitting: Family Medicine

## 2014-07-17 VITALS — BP 149/102 | HR 97 | Temp 98.4°F | Ht 66.0 in | Wt 177.0 lb

## 2014-07-17 DIAGNOSIS — B373 Candidiasis of vulva and vagina: Secondary | ICD-10-CM | POA: Diagnosis not present

## 2014-07-17 DIAGNOSIS — I10 Essential (primary) hypertension: Secondary | ICD-10-CM

## 2014-07-17 DIAGNOSIS — B3731 Acute candidiasis of vulva and vagina: Secondary | ICD-10-CM | POA: Diagnosis not present

## 2014-07-17 MED ORDER — AMLODIPINE BESYLATE 5 MG PO TABS: 5.0000 mg | ORAL_TABLET | Freq: Every day | ORAL | Status: DC

## 2014-07-17 MED ORDER — FLUCONAZOLE 150 MG PO TABS: 150.0000 mg | ORAL_TABLET | Freq: Once | ORAL | Status: DC

## 2014-07-17 MED ORDER — HYDROCHLOROTHIAZIDE 25 MG PO TABS: 25.0000 mg | ORAL_TABLET | Freq: Every day | ORAL | Status: DC

## 2014-07-17 NOTE — Progress Notes (Signed)
   Subjective:    Patient ID: Zoe Swanson, female    DOB: 06-04-1976, 38 y.o.   MRN: [id number]  HPI Here for high BP. She feels fine although she has noted some mild swelling in the feet. Her BP has gone up tot eh 140s over 100s at home.    Review of Systems  Constitutional: Negative.   Respiratory: Negative.   Cardiovascular: Negative.        Objective:   Physical Exam  Constitutional: She appears well-developed and well-nourished.  Cardiovascular: Normal rate, regular rhythm, normal heart sounds and intact distal pulses.   Pulmonary/Chest: Effort normal and breath sounds normal.  Musculoskeletal: She exhibits no edema.          Assessment & Plan:  Add HCTZ daily. Recheck in one month

## 2014-07-17 NOTE — Progress Notes (Signed)
Pre visit review using our clinic review tool, if applicable. No additional management support is needed unless otherwise documented below in the visit note. 

## 2014-07-18 ENCOUNTER — Other Ambulatory Visit: Payer: Self-pay

## 2014-07-18 ENCOUNTER — Other Ambulatory Visit (INDEPENDENT_AMBULATORY_CARE_PROVIDER_SITE_OTHER): Payer: Self-pay | Admitting: General Surgery

## 2014-07-18 DIAGNOSIS — N6452 Nipple discharge: Secondary | ICD-10-CM

## 2014-07-18 DIAGNOSIS — R2232 Localized swelling, mass and lump, left upper limb: Secondary | ICD-10-CM

## 2014-07-23 ENCOUNTER — Ambulatory Visit
Admission: RE | Admit: 2014-07-23 | Discharge: 2014-07-23 | Disposition: A | Discharge status: Home / Self Care | Payer: BC Managed Care – PPO | Source: Ambulatory Visit | Attending: General Surgery | Admitting: General Surgery

## 2014-07-23 ENCOUNTER — Other Ambulatory Visit (INDEPENDENT_AMBULATORY_CARE_PROVIDER_SITE_OTHER): Payer: Self-pay | Admitting: General Surgery

## 2014-07-23 DIAGNOSIS — N6452 Nipple discharge: Secondary | ICD-10-CM

## 2014-07-23 DIAGNOSIS — R2232 Localized swelling, mass and lump, left upper limb: Secondary | ICD-10-CM

## 2014-07-26 ENCOUNTER — Emergency Department (HOSPITAL_COMMUNITY)
Admission: EM | Admit: 2014-07-26 | Discharge: 2014-07-26 | Disposition: A | Discharge status: Home / Self Care | Payer: BC Managed Care – PPO | Attending: Emergency Medicine | Admitting: Emergency Medicine

## 2014-07-26 ENCOUNTER — Encounter (HOSPITAL_COMMUNITY): Payer: Self-pay | Admitting: Emergency Medicine

## 2014-07-26 DIAGNOSIS — L299 Pruritus, unspecified: Secondary | ICD-10-CM | POA: Diagnosis present

## 2014-07-26 DIAGNOSIS — R2230 Localized swelling, mass and lump, unspecified upper limb: Secondary | ICD-10-CM

## 2014-07-26 DIAGNOSIS — Z7952 Long term (current) use of systemic steroids: Secondary | ICD-10-CM | POA: Diagnosis not present

## 2014-07-26 DIAGNOSIS — Z9889 Other specified postprocedural states: Secondary | ICD-10-CM | POA: Diagnosis not present

## 2014-07-26 DIAGNOSIS — J45909 Unspecified asthma, uncomplicated: Secondary | ICD-10-CM | POA: Diagnosis not present

## 2014-07-26 DIAGNOSIS — Z79899 Other long term (current) drug therapy: Secondary | ICD-10-CM | POA: Insufficient documentation

## 2014-07-26 DIAGNOSIS — Z9851 Tubal ligation status: Secondary | ICD-10-CM | POA: Diagnosis not present

## 2014-07-26 DIAGNOSIS — G43909 Migraine, unspecified, not intractable, without status migrainosus: Secondary | ICD-10-CM | POA: Diagnosis not present

## 2014-07-26 DIAGNOSIS — T7840XA Allergy, unspecified, initial encounter: Secondary | ICD-10-CM

## 2014-07-26 DIAGNOSIS — I10 Essential (primary) hypertension: Secondary | ICD-10-CM | POA: Diagnosis not present

## 2014-07-26 HISTORY — DX: Localized swelling, mass and lump, unspecified upper limb: R22.30

## 2014-07-26 MED ORDER — PREDNISONE 20 MG PO TABS: 40.0000 mg | ORAL_TABLET | Freq: Every day | ORAL | Status: DC

## 2014-07-26 MED ORDER — PREDNISONE 20 MG PO TABS
40.0000 mg | ORAL_TABLET | Freq: Once | ORAL | Status: AC
  Administered 2014-07-26: 40 mg via ORAL
  Filled 2014-07-26: qty 2

## 2014-07-26 MED ORDER — DEXAMETHASONE SODIUM PHOSPHATE 10 MG/ML IJ SOLN
10.0000 mg | Freq: Once | INTRAMUSCULAR | Status: AC
  Administered 2014-07-26: 10 mg via INTRAMUSCULAR
  Filled 2014-07-26: qty 1

## 2014-07-26 NOTE — ED Notes (Addendum)
Per EMS, Pt c/o allergic reaction and itching starting this afternoon.  Pt reports an allergy to mold and several other things.  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  50mg  Benadryl and .15 Epi given prior to arrival.  Sts she had "allergy" shots this morning.  Pt reports previous severe reactions, but is "feeling better."  NAD noted.

## 2014-07-26 NOTE — ED Provider Notes (Signed)
CSN: [id number]     Arrival date & time 07/26/14  1340 History   First MD Initiated Contact with Patient 07/26/14 1416     Chief Complaint  Patient presents with  . Allergic Reaction     (Consider location/radiation/quality/duration/timing/severity/associated sxs/prior Treatment) HPI Comments: 39 year old female, with a history of severe allergies in the past presents with a complaint of itching and the feeling of an oncoming allergic reaction. She reports that she got injectable allergy shots this morning which were routine for her, she then proceeded to go to work and came in contact with a lot of dust which she thinks started off her reaction feeling that her ears were hot and her fingers were itching, she was given epinephrine to try to prevent anaphylaxis which she has had multiple times in the past. This improved her symptoms, she took Benadryl which also helped, and this time her symptoms are extremely mild but still present, no shortness of breath wheezing or swelling of the lips or tongue.  Patient is a 38 y.o. female presenting with allergic reaction. The history is provided by the patient.  Allergic Reaction   Past Medical History  Diagnosis Date  . Asthma   . Migraine   . Allergy   . Hypertension    Past Surgical History  Procedure Laterality Date  . Ankle surgery  06-02-11    left ankle, per Dr. Rhona Raider, repair impingement  by loose body (old fibular fracture0 and repair of anterolateral ligament  . Tubal ligation    . Breast surgery  2008    left breast biopsy, benign, per Dr. Lennie Hummer   . Brain surgery      Neuromodulation   Family History  Problem Relation Age of Onset  . Arthritis    . Diabetes    . Hypertension    . Prostate cancer     History  Substance Use Topics  . Smoking status: Never Smoker   . Smokeless tobacco: Never Used  . Alcohol Use: No   OB History   Grav Para Term Preterm Abortions TAB SAB Ect Mult Living                 Review of  Systems  All other systems reviewed and are negative.     Allergies  Lisinopril; Molds & smuts; and Pollen extract  Home Medications   Prior to Admission medications   Medication Sig Start Date End Date Taking? Authorizing Provider  albuterol (PROVENTIL) (2.5 MG/3ML) 0.083% nebulizer solution Take 2.5 mg by nebulization every 4 (four) hours as needed for wheezing or shortness of breath (asthma).    Yes Historical Provider, MD  albuterol (VENTOLIN HFA) 108 (90 BASE) MCG/ACT inhaler Inhale 2 puffs into the lungs every 6 (six) hours as needed for wheezing or shortness of breath (ashtma).    Yes Historical Provider, MD  ALPRAZolam Duanne Moron) 0.5 MG tablet Take 0.5 mg by mouth daily as needed for anxiety (anxiety).   Yes Historical Provider, MD  amLODipine (NORVASC) 5 MG tablet Take 1 tablet (5 mg total) by mouth daily. 07/17/14  Yes Laurey Morale, MD  cyclobenzaprine (FLEXERIL) 10 MG tablet Take 10 mg by mouth 3 (three) times daily as needed for muscle spasms (muscle spasms).   Yes Historical Provider, MD  diphenhydrAMINE (BENADRYL) 25 MG tablet Take 25 mg by mouth every 6 (six) hours as needed for allergies (allergic reaction).   Yes Historical Provider, MD  EPINEPHrine 0.3 mg/0.3 mL IJ SOAJ injection Inject 0.3  mg into the muscle once. 02/03/14  Yes Mariea Clonts, MD  hydrochlorothiazide (HYDRODIURIL) 25 MG tablet Take 1 tablet (25 mg total) by mouth daily. 07/17/14  Yes Laurey Morale, MD  zolpidem (AMBIEN) 10 MG tablet Take 10 mg by mouth at bedtime as needed for sleep.   Yes Historical Provider, MD  predniSONE (DELTASONE) 20 MG tablet Take 2 tablets (40 mg total) by mouth daily. 07/26/14   Johnna Acosta, MD   BP 138/87  Pulse 100  Temp(Src) 98.7 F (37.1 C) (Oral)  Resp 20  SpO2 98% Physical Exam  Nursing note and vitals reviewed. Constitutional: She appears well-developed and well-nourished. No distress.  HENT:  Head: Normocephalic and atraumatic.  Mouth/Throat: Oropharynx is clear  and moist. No oropharyngeal exudate.  Eyes: Conjunctivae and EOM are normal. Pupils are equal, round, and reactive to light. Right eye exhibits no discharge. Left eye exhibits no discharge. No scleral icterus.  Neck: Normal range of motion. Neck supple. No JVD present. No thyromegaly present.  Cardiovascular: Normal rate, regular rhythm, normal heart sounds and intact distal pulses.  Exam reveals no gallop and no friction rub.   No murmur heard. Pulmonary/Chest: Effort normal and breath sounds normal. No respiratory distress. She has no wheezes. She has no rales.  Abdominal: Soft. Bowel sounds are normal. She exhibits no distension and no mass. There is no tenderness.  Musculoskeletal: Normal range of motion. She exhibits no edema and no tenderness.  Lymphadenopathy:    She has no cervical adenopathy.  Neurological: She is alert. Coordination normal.  Skin: Skin is warm and dry. No rash noted. No erythema.  Psychiatric: She has a normal mood and affect. Her behavior is normal.    ED Course  Procedures (including critical care time) Labs Review Labs Reviewed - No data to display  Imaging Review No results found.    MDM   Final diagnoses:  Allergic reaction, initial encounter    The patient has a totally benign exam with a normal blood pressure, no tachycardia, no hypoxia, no fever. She will be given steroids reevaluated and discharged home to followup as outpatient, the patient has been through this many times and at this time does not appear toxic. She does not have anaphylaxis.  Well appearing, improved, no swelling, no rash, no itching,   Meds given in ED:  Medications  predniSONE (DELTASONE) tablet 40 mg (40 mg Oral Given 07/26/14 1436)  dexamethasone (DECADRON) injection 10 mg (10 mg Intramuscular Given 07/26/14 1451)    New Prescriptions   PREDNISONE (DELTASONE) 20 MG TABLET    Take 2 tablets (40 mg total) by mouth daily.        Johnna Acosta, MD 07/26/14 [phone]

## 2014-07-26 NOTE — Discharge Instructions (Signed)
You have been diagnosed with an allergic reaction. Usually allergic reactions like this are caused by exposures to something that you either ate or touched or smelled.  It may be related to a number of different exposures including a new perfume, topical creams, soaps, detergents, linens, clothing, medications. Occasionally we do not find an answer for why there is an allergic reaction. These are treated the same way including Benadryl as needed for itching and rash. (This can be used up to 50 mg every 6 hours as needed).  Pepcid 20 mg every night and prednisone once a day for 5 days. Please do not take the Benadryl and drive or take care of children or other imported duties as the Benadryl can make you sleepy.   ° °If you should develop severe or worsening symptoms including difficulty breathing, difficulty swallowing, wheezing or increased coughing or a rash that developed on the inside of your mouth or a worsening rash on your skin, return to the hospital immediately for a recheck. Please call your Dr. in the morning for a recheck in 2 days if you are still having symptoms. If you do not have a Dr. see the list below.  If we have identified the source of your allergic reaction, please avoid this at all costs. This means stopping the medication if it is a new medication or a voiding topical exposures such as creams lotions body soaps or deodorants if this is the source. ° °Allergic Reaction, Mild to Moderate °Allergies may happen from anything your body is sensitive to. This may be food, medications, pollens, chemicals, and nearly anything around you in everyday life that produces allergens. An allergen is anything that causes an allergy producing substance. Allergens cause your body to release allergic antibodies. Through a chain of events, they cause a release of histamine into the blood stream. Histamines are meant to protect you, but they also cause your discomfort. This is why antihistamines are often used  for allergies. Heredity is often a factor in causing allergic reactions. This means you may have some of the same allergies as your parents. °Allergies happen in all age groups. You may have some idea of what caused your reaction. There are many allergens around us. It may be difficult to know what caused your reaction. If this is a first time event, it may never happen again. Allergies cannot be cured but can be controlled with medications. °SYMPTOMS  °You may get some or all of the following problems from allergies. °· Swelling and itching in and around the mouth.  °· Tearing, itchy eyes.  °· Nasal congestion and runny nose.  °· Sneezing and coughing.  °· An itchy red rash or hives.  °· Vomiting or diarrhea.  °· Difficulty breathing.  °Seasonal allergies occur in all age groups. They are seasonal because they usually occur during the same season every year. They may be a reaction to molds, grass pollens, or tree pollens. Other causes of allergies are house dust mite allergens, pet dander and mold spores. These are just a common few of the thousands of allergens around us. All of the symptoms listed above happen when you come in contact with pollens and other allergens. Seasonal allergies are usually not life threatening. They are generally more of a nuisance that can often be handled using medications. °Hay fever is a combination of all or some of the above listed allergy problems. It may often be treated with simple over-the-counter medications such as diphenhydramine. Take medication as   insert for child dosages. TREATMENT AND HOME CARE INSTRUCTIONS If hives or rash are present: Take medications as directed.  You may use an over-the-counter antihistamine (diphenhydramine) for hives and itching as needed. Do not drive or drink alcohol until medications used to treat the reaction have worn off. Antihistamines tend to make people sleepy.  Apply cold cloths (compresses) to the  skin or take baths in cool water. This will help itching. Avoid hot baths or showers. Heat will make a rash and itching worse.  If your allergies persist and become more severe, and over the counter medications are not effective, there are many new medications your caretaker can prescribe. Immunotherapy or desensitizing injections can be used if all else fails. Follow up with your caregiver if problems continue.  SEEK MEDICAL CARE IF:  Your allergies are becoming progressively more troublesome.  You suspect a food allergy. Symptoms generally happen within 30 minutes of eating a food.  Your symptoms have not gone away within 2 days or are getting worse.  You develop new symptoms.  You want to retest yourself or your child with a food or drink you think causes an allergic reaction. Never test yourself or your child of a suspected allergy without being under the watchful eye of your caregivers. A second exposure to an allergen may be life-threatening.  SEEK IMMEDIATE MEDICAL CARE IF: You develop difficulty breathing or wheezing, or have a tight feeling in your chest or throat.  You develop a swollen mouth, hives, swelling, or itching all over your body.  A severe reaction with any of the above problems should be considered life-threatening. If you suddenly develop difficulty breathing call for local emergency medical help. THIS IS AN EMERGENCY. MAKE SURE YOU:  Understand these instructions.  Will watch your condition.  Will get help right away if you are not doing well or get worse.  Document Released: 08/09/2007 Document Revised: 10/01/2011 Document Reviewed: 08/09/2007 [employer] Patient Information 2012 [employer].      

## 2014-07-26 NOTE — ED Notes (Signed)
Bed: XA12 Expected date:  Expected time:  Means of arrival:  Comments: Allergic reaction

## 2014-07-31 ENCOUNTER — Other Ambulatory Visit (INDEPENDENT_AMBULATORY_CARE_PROVIDER_SITE_OTHER): Payer: Self-pay | Admitting: General Surgery

## 2014-07-31 ENCOUNTER — Ambulatory Visit
Admission: RE | Admit: 2014-07-31 | Discharge: 2014-07-31 | Disposition: A | Discharge status: Home / Self Care | Payer: BC Managed Care – PPO | Source: Ambulatory Visit | Attending: General Surgery | Admitting: General Surgery

## 2014-07-31 DIAGNOSIS — R2232 Localized swelling, mass and lump, left upper limb: Secondary | ICD-10-CM

## 2014-07-31 DIAGNOSIS — N6452 Nipple discharge: Secondary | ICD-10-CM

## 2014-08-08 ENCOUNTER — Other Ambulatory Visit: Payer: Self-pay | Admitting: Family Medicine

## 2014-08-09 NOTE — Telephone Encounter (Signed)
Call in #[phone] with 5 rf 

## 2014-08-14 ENCOUNTER — Other Ambulatory Visit (INDEPENDENT_AMBULATORY_CARE_PROVIDER_SITE_OTHER): Payer: Self-pay | Admitting: General Surgery

## 2014-08-14 DIAGNOSIS — N6320 Unspecified lump in the left breast, unspecified quadrant: Secondary | ICD-10-CM

## 2014-08-16 ENCOUNTER — Other Ambulatory Visit (INDEPENDENT_AMBULATORY_CARE_PROVIDER_SITE_OTHER): Payer: Self-pay | Admitting: General Surgery

## 2014-08-16 DIAGNOSIS — N6320 Unspecified lump in the left breast, unspecified quadrant: Secondary | ICD-10-CM

## 2014-08-17 ENCOUNTER — Encounter (HOSPITAL_BASED_OUTPATIENT_CLINIC_OR_DEPARTMENT_OTHER): Payer: Self-pay | Admitting: *Deleted

## 2014-08-17 NOTE — Pre-Procedure Instructions (Signed)
To come for CMET, CBC, diff, EKG 

## 2014-08-20 ENCOUNTER — Encounter (HOSPITAL_BASED_OUTPATIENT_CLINIC_OR_DEPARTMENT_OTHER)
Admission: RE | Admit: 2014-08-20 | Discharge: 2014-08-20 | Disposition: A | Discharge status: Home / Self Care | Payer: BC Managed Care – PPO | Source: Ambulatory Visit | Attending: General Surgery | Admitting: General Surgery

## 2014-08-20 ENCOUNTER — Other Ambulatory Visit: Payer: Self-pay

## 2014-08-20 DIAGNOSIS — Z01818 Encounter for other preprocedural examination: Secondary | ICD-10-CM | POA: Insufficient documentation

## 2014-08-20 LAB — CBC WITH DIFFERENTIAL/PLATELET
BASOS ABS: 0.1 10*3/uL (ref 0.0–0.1)
BASOS PCT: 1 % (ref 0–1)
EOS ABS: 0.2 10*3/uL (ref 0.0–0.7)
Eosinophils Relative: 2 % (ref 0–5)
HEMATOCRIT: 39.3 % (ref 36.0–46.0)
HEMOGLOBIN: 13.4 g/dL (ref 12.0–15.0)
LYMPHS ABS: 2.8 10*3/uL (ref 0.7–4.0)
Lymphocytes Relative: 29 % (ref 12–46)
MCH: 27.5 pg (ref 26.0–34.0)
MCHC: 34.1 g/dL (ref 30.0–36.0)
MCV: 80.5 fL (ref 78.0–100.0)
MONO ABS: 0.8 10*3/uL (ref 0.1–1.0)
Monocytes Relative: 8 % (ref 3–12)
NEUTROS ABS: 5.7 10*3/uL (ref 1.7–7.7)
Neutrophils Relative %: 60 % (ref 43–77)
Platelets: 430 10*3/uL — ABNORMAL HIGH (ref 150–400)
RBC: 4.88 MIL/uL (ref 3.87–5.11)
RDW: 14.1 % (ref 11.5–15.5)
WBC: 9.6 10*3/uL (ref 4.0–10.5)

## 2014-08-20 LAB — COMPREHENSIVE METABOLIC PANEL
ALBUMIN: 4.4 g/dL (ref 3.5–5.2)
ALT: 12 U/L (ref 0–35)
AST: 18 U/L (ref 0–37)
Alkaline Phosphatase: 102 U/L (ref 39–117)
Anion gap: 14 (ref 5–15)
BUN: 13 mg/dL (ref 6–23)
CALCIUM: 10 mg/dL (ref 8.4–10.5)
CO2: 29 mEq/L (ref 19–32)
CREATININE: 0.85 mg/dL (ref 0.50–1.10)
Chloride: 97 mEq/L (ref 96–112)
GFR calc non Af Amer: 85 mL/min — ABNORMAL LOW (ref >90)
GLUCOSE: 125 mg/dL — AB (ref 70–99)
Potassium: 3.8 mEq/L (ref 3.7–5.3)
Sodium: 140 mEq/L (ref 137–147)
TOTAL PROTEIN: 8.6 g/dL — AB (ref 6.0–8.3)
Total Bilirubin: <0.2 mg/dL — ABNORMAL LOW (ref 0.3–1.2)

## 2014-08-22 NOTE — H&P (Signed)
  Zoe Swanson  Location: [facility] Patient #: [id number] DOB: [birth year] Undefined / Language: Cleophus Molt / Race: Black or African American Female  History of Present Illness   The patient is a 38 year old female who presents with a breast mass. This 38 year old woman returns following radiographic evaluation of her left breast. She has undergone 2 breast biopsies in the past for benign disease. 2 months ago she felt a lump under her left axilla which she states is now gone. She also noticed some left breast nipple discharge. When I saw her on September 18 I could elicit just a little bit of milky fluid. No breast mass. Comorbidities include asthma. Breast family history reveals breast cancer in a cousin. She thinks is one family member with ovarian cancer. Numerous family members with prostate cancer.  07/15/2014 she underwent bilateral mammograms and left breast ultrasound. The axilla was negative. There was a 12 mm masslike density in the left breast at the 9:00 position 5 cm from the nipple. Image guided biopsy performed on October 6 reveals no calcifications. Pathology revealed benign breast tissue with fibrocystic change and adenosis. Six-month followup is offered. She stated she would like this area excised and returns to discuss that. She is concerned because she is a young mother has children and there is a family history of cancer.  I have offered left breast lumpectomy with radioactive seed localization and she agrees. I discussed the indications, details, techniques, and numerous risk of the surgery with her. She's aware of the risk of bleeding, infection, cosmetic deformity, further surgery if cancer, nerve damage with chronic pain, and other unforeseen problems. She understands all these issues all her questions are answered. She agrees with this plan.   Allergies Lisinopril *ANTIHYPERTENSIVES* Pollen Extracts *ALTERNATIVE MEDICINES* Mold Spores  Medication  History  Albuterol Sulfate HFA (108 (90 Base)MCG/ACT Aerosol Soln, Inhalation) Active. Xanax (0.5MG  Tablet, Oral) Active. Norvasc (5MG  Tablet, Oral) Active. Flexeril (10MG  Tablet, Oral) Active. EpiPen (0.3 MG/0.3ML(1:1000) Device, Intramuscular) Active. Ambien (10MG  Tablet, Oral) Active.  Vitals 08/14/2014 1:23 PM Weight: 175.38 lb Height: 66in Body Surface Area: 1.92 m Body Mass Index: 28.31 kg/m Temp.: 97.47F(Temporal)  Pulse: 89 (Regular)  Resp.: 22 (Unlabored)  BP: 148/98 (Sitting, Left Arm, Standard)    Physical Exam  General Note: Pleasant patient. Young and healthy. Alert. Middle status normal. No distress.   Chest and Lung Exam Note: Lungs are clear auscultation bilaterally. No chest wall tenderness   Breast Note: Left breast and axilla are examined. I can see the biopsy site medially. No hematoma. No palpable mass. No axillary adenopathy or mass. Well-healed circumareolar scar medially and a not a second one laterally. I cannot elicit any discharge. No subareolar mass.   Cardiovascular Note: Cardiac exam reveals regular rate and rhythm. No murmur. No ectopy.     Assessment & Plan LEFT BREAST MASS (611.72  N63) Current Plans  Schedule for Surgery Plan left lumpectomy with radioactive seed localization. This was discussed and techniques outlined. Differential diagnosis discussed. Low probability of malignancy We'll schedule it at patient's convenience   Prue. Dalbert Batman, M.D., [facility] General and Minimally invasive Surgery Breast and Colorectal Surgery Office:   [phone] Pager:   [phone]

## 2014-08-23 ENCOUNTER — Ambulatory Visit
Admission: RE | Admit: 2014-08-23 | Discharge: 2014-08-23 | Disposition: A | Discharge status: Home / Self Care | Payer: BC Managed Care – PPO | Source: Ambulatory Visit | Attending: General Surgery | Admitting: General Surgery

## 2014-08-23 DIAGNOSIS — N6320 Unspecified lump in the left breast, unspecified quadrant: Secondary | ICD-10-CM

## 2014-08-24 ENCOUNTER — Encounter (HOSPITAL_BASED_OUTPATIENT_CLINIC_OR_DEPARTMENT_OTHER)
Admission: RE | Disposition: A | Discharge status: Home / Self Care | Payer: Self-pay | Source: Ambulatory Visit | Attending: General Surgery

## 2014-08-24 ENCOUNTER — Encounter (HOSPITAL_BASED_OUTPATIENT_CLINIC_OR_DEPARTMENT_OTHER): Payer: BC Managed Care – PPO | Admitting: Anesthesiology

## 2014-08-24 ENCOUNTER — Ambulatory Visit (HOSPITAL_BASED_OUTPATIENT_CLINIC_OR_DEPARTMENT_OTHER)
Admission: RE | Admit: 2014-08-24 | Discharge: 2014-08-24 | Disposition: A | Discharge status: Home / Self Care | Payer: BC Managed Care – PPO | Source: Ambulatory Visit | Attending: General Surgery | Admitting: General Surgery

## 2014-08-24 ENCOUNTER — Ambulatory Visit (HOSPITAL_BASED_OUTPATIENT_CLINIC_OR_DEPARTMENT_OTHER): Payer: BC Managed Care – PPO | Admitting: Anesthesiology

## 2014-08-24 ENCOUNTER — Ambulatory Visit
Admission: RE | Admit: 2014-08-24 | Discharge: 2014-08-24 | Disposition: A | Discharge status: Home / Self Care | Payer: BC Managed Care – PPO | Source: Ambulatory Visit | Attending: General Surgery | Admitting: General Surgery

## 2014-08-24 ENCOUNTER — Encounter (HOSPITAL_BASED_OUTPATIENT_CLINIC_OR_DEPARTMENT_OTHER): Payer: Self-pay

## 2014-08-24 DIAGNOSIS — N63 Unspecified lump in breast: Secondary | ICD-10-CM | POA: Diagnosis present

## 2014-08-24 DIAGNOSIS — Z888 Allergy status to other drugs, medicaments and biological substances status: Secondary | ICD-10-CM | POA: Insufficient documentation

## 2014-08-24 DIAGNOSIS — D242 Benign neoplasm of left breast: Secondary | ICD-10-CM | POA: Insufficient documentation

## 2014-08-24 DIAGNOSIS — J45909 Unspecified asthma, uncomplicated: Secondary | ICD-10-CM | POA: Diagnosis not present

## 2014-08-24 DIAGNOSIS — I10 Essential (primary) hypertension: Secondary | ICD-10-CM | POA: Diagnosis not present

## 2014-08-24 DIAGNOSIS — Z8041 Family history of malignant neoplasm of ovary: Secondary | ICD-10-CM | POA: Diagnosis not present

## 2014-08-24 DIAGNOSIS — Z803 Family history of malignant neoplasm of breast: Secondary | ICD-10-CM | POA: Diagnosis not present

## 2014-08-24 DIAGNOSIS — N6320 Unspecified lump in the left breast, unspecified quadrant: Secondary | ICD-10-CM

## 2014-08-24 DIAGNOSIS — Z91048 Other nonmedicinal substance allergy status: Secondary | ICD-10-CM | POA: Diagnosis not present

## 2014-08-24 HISTORY — PX: BREAST LUMPECTOMY WITH RADIOACTIVE SEED LOCALIZATION: SHX6424

## 2014-08-24 HISTORY — DX: Complex regional pain syndrome i of unspecified lower limb: G90.529

## 2014-08-24 HISTORY — DX: Allergy status to unspecified drugs, medicaments and biological substances: Z88.9

## 2014-08-24 HISTORY — DX: Localized swelling, mass and lump, unspecified upper limb: R22.30

## 2014-08-24 SURGERY — BREAST LUMPECTOMY WITH RADIOACTIVE SEED LOCALIZATION
Anesthesia: General | Site: Breast | Laterality: Left

## 2014-08-24 MED ORDER — DEXAMETHASONE SODIUM PHOSPHATE 4 MG/ML IJ SOLN
INTRAMUSCULAR | Status: DC | PRN
  Administered 2014-08-24: 10 mg via INTRAVENOUS

## 2014-08-24 MED ORDER — CHLORHEXIDINE GLUCONATE 4 % EX LIQD: 1.0000 "application " | Freq: Once | CUTANEOUS | Status: DC

## 2014-08-24 MED ORDER — FENTANYL CITRATE 0.05 MG/ML IJ SOLN: 50.0000 ug | INTRAMUSCULAR | Status: DC | PRN

## 2014-08-24 MED ORDER — LACTATED RINGERS IV SOLN
INTRAVENOUS | Status: DC
  Administered 2014-08-24: 14:00:00 via INTRAVENOUS

## 2014-08-24 MED ORDER — PROPOFOL 10 MG/ML IV BOLUS
INTRAVENOUS | Status: DC | PRN
  Administered 2014-08-24: 200 mg via INTRAVENOUS

## 2014-08-24 MED ORDER — SODIUM CHLORIDE 0.9 % IV SOLN: INTRAVENOUS | Status: DC

## 2014-08-24 MED ORDER — CEFAZOLIN SODIUM-DEXTROSE 2-3 GM-% IV SOLR
INTRAVENOUS | Status: AC
  Filled 2014-08-24: qty 50

## 2014-08-24 MED ORDER — HYDROMORPHONE HCL 1 MG/ML IJ SOLN
0.2500 mg | INTRAMUSCULAR | Status: DC | PRN
  Administered 2014-08-24 (×4): 0.5 mg via INTRAVENOUS

## 2014-08-24 MED ORDER — CEFAZOLIN SODIUM-DEXTROSE 2-3 GM-% IV SOLR: 2.0000 g | INTRAVENOUS | Status: DC

## 2014-08-24 MED ORDER — BUPIVACAINE-EPINEPHRINE (PF) 0.5% -1:200000 IJ SOLN
INTRAMUSCULAR | Status: DC | PRN
  Administered 2014-08-24: 15 mL via PERINEURAL

## 2014-08-24 MED ORDER — CEFAZOLIN SODIUM-DEXTROSE 2-3 GM-% IV SOLR
INTRAVENOUS | Status: DC | PRN
  Administered 2014-08-24: 2 g via INTRAVENOUS

## 2014-08-24 MED ORDER — SODIUM CHLORIDE 0.9 % IV SOLN: 250.0000 mL | INTRAVENOUS | Status: DC | PRN

## 2014-08-24 MED ORDER — MIDAZOLAM HCL 2 MG/2ML IJ SOLN: 1.0000 mg | INTRAMUSCULAR | Status: DC | PRN

## 2014-08-24 MED ORDER — OXYCODONE HCL 5 MG PO TABS
5.0000 mg | ORAL_TABLET | Freq: Once | ORAL | Status: AC | PRN
  Administered 2014-08-24: 5 mg via ORAL

## 2014-08-24 MED ORDER — OXYCODONE HCL 5 MG PO TABS: 5.0000 mg | ORAL_TABLET | ORAL | Status: DC | PRN

## 2014-08-24 MED ORDER — ACETAMINOPHEN 650 MG RE SUPP: 650.0000 mg | RECTAL | Status: DC | PRN

## 2014-08-24 MED ORDER — LIDOCAINE HCL (CARDIAC) 20 MG/ML IV SOLN
INTRAVENOUS | Status: DC | PRN
  Administered 2014-08-24: 100 mg via INTRAVENOUS

## 2014-08-24 MED ORDER — MIDAZOLAM HCL 2 MG/ML PO SYRP: 12.0000 mg | ORAL_SOLUTION | Freq: Once | ORAL | Status: DC | PRN

## 2014-08-24 MED ORDER — MEPERIDINE HCL 25 MG/ML IJ SOLN: 6.2500 mg | INTRAMUSCULAR | Status: DC | PRN

## 2014-08-24 MED ORDER — HYDROMORPHONE HCL 1 MG/ML IJ SOLN
INTRAMUSCULAR | Status: AC
  Filled 2014-08-24: qty 1

## 2014-08-24 MED ORDER — SODIUM CHLORIDE 0.9 % IJ SOLN: 3.0000 mL | Freq: Two times a day (BID) | INTRAMUSCULAR | Status: DC

## 2014-08-24 MED ORDER — ONDANSETRON HCL 4 MG/2ML IJ SOLN: 4.0000 mg | Freq: Once | INTRAMUSCULAR | Status: DC | PRN

## 2014-08-24 MED ORDER — ACETAMINOPHEN 325 MG PO TABS: 650.0000 mg | ORAL_TABLET | ORAL | Status: DC | PRN

## 2014-08-24 MED ORDER — OXYCODONE HCL 5 MG PO TABS
ORAL_TABLET | ORAL | Status: AC
  Filled 2014-08-24: qty 1

## 2014-08-24 MED ORDER — MIDAZOLAM HCL 5 MG/5ML IJ SOLN
INTRAMUSCULAR | Status: DC | PRN
  Administered 2014-08-24: 2 mg via INTRAVENOUS

## 2014-08-24 MED ORDER — LACTATED RINGERS IV SOLN
INTRAVENOUS | Status: DC | PRN
  Administered 2014-08-24: 12:00:00 via INTRAVENOUS

## 2014-08-24 MED ORDER — FENTANYL CITRATE 0.05 MG/ML IJ SOLN
INTRAMUSCULAR | Status: AC
  Filled 2014-08-24: qty 4

## 2014-08-24 MED ORDER — SODIUM CHLORIDE 0.9 % IJ SOLN: 3.0000 mL | INTRAMUSCULAR | Status: DC | PRN

## 2014-08-24 MED ORDER — FENTANYL CITRATE 0.05 MG/ML IJ SOLN
INTRAMUSCULAR | Status: DC | PRN
  Administered 2014-08-24 (×2): 50 ug via INTRAVENOUS

## 2014-08-24 MED ORDER — BUPIVACAINE-EPINEPHRINE (PF) 0.5% -1:200000 IJ SOLN
INTRAMUSCULAR | Status: AC
Start: 2014-08-24 — End: 2014-08-24
  Filled 2014-08-24: qty 30

## 2014-08-24 MED ORDER — HYDROCODONE-ACETAMINOPHEN 5-325 MG PO TABS: 1.0000 | ORAL_TABLET | Freq: Four times a day (QID) | ORAL | Status: DC | PRN

## 2014-08-24 MED ORDER — FENTANYL CITRATE 0.05 MG/ML IJ SOLN: 25.0000 ug | INTRAMUSCULAR | Status: DC | PRN

## 2014-08-24 MED ORDER — MIDAZOLAM HCL 2 MG/2ML IJ SOLN
INTRAMUSCULAR | Status: AC
  Filled 2014-08-24: qty 2

## 2014-08-24 MED ORDER — OXYCODONE HCL 5 MG/5ML PO SOLN: 5.0000 mg | Freq: Once | ORAL | Status: AC | PRN

## 2014-08-24 SURGICAL SUPPLY — 64 items
APL SKNCLS STERI-STRIP NONHPOA (GAUZE/BANDAGES/DRESSINGS)
APPLIER CLIP 9.375 MED OPEN (MISCELLANEOUS)
APR CLP MED 9.3 20 MLT OPN (MISCELLANEOUS)
BENZOIN TINCTURE PRP APPL 2/3 (GAUZE/BANDAGES/DRESSINGS) IMPLANT
BINDER BREAST LRG (GAUZE/BANDAGES/DRESSINGS) IMPLANT
BINDER BREAST MEDIUM (GAUZE/BANDAGES/DRESSINGS) IMPLANT
BINDER BREAST XLRG (GAUZE/BANDAGES/DRESSINGS) ×1 IMPLANT
BINDER BREAST XXLRG (GAUZE/BANDAGES/DRESSINGS) IMPLANT
BLADE HEX COATED 2.75 (ELECTRODE) ×2 IMPLANT
BLADE SURG 10 STRL SS (BLADE) IMPLANT
BLADE SURG 15 STRL LF DISP TIS (BLADE) ×1 IMPLANT
BLADE SURG 15 STRL SS (BLADE) ×2
CANISTER SUC SOCK COL 7IN (MISCELLANEOUS) ×1 IMPLANT
CANISTER SUCT 1200ML W/VALVE (MISCELLANEOUS) ×2 IMPLANT
CHLORAPREP W/TINT 26ML (MISCELLANEOUS) ×2 IMPLANT
CLIP APPLIE 9.375 MED OPEN (MISCELLANEOUS) IMPLANT
COVER BACK TABLE 60X90IN (DRAPES) ×2 IMPLANT
COVER MAYO STAND STRL (DRAPES) ×2 IMPLANT
COVER PROBE W GEL 5X96 (DRAPES) ×2 IMPLANT
DECANTER SPIKE VIAL GLASS SM (MISCELLANEOUS) IMPLANT
DEVICE DUBIN W/COMP PLATE 8390 (MISCELLANEOUS) ×2 IMPLANT
DRAIN CHANNEL 19F RND (DRAIN) IMPLANT
DRAPE LAPAROSCOPIC ABDOMINAL (DRAPES) ×2 IMPLANT
DRAPE UTILITY XL STRL (DRAPES) ×2 IMPLANT
DRSG PAD ABDOMINAL 8X10 ST (GAUZE/BANDAGES/DRESSINGS) IMPLANT
ELECT REM PT RETURN 9FT ADLT (ELECTROSURGICAL) ×2
ELECTRODE REM PT RTRN 9FT ADLT (ELECTROSURGICAL) ×1 IMPLANT
EVACUATOR SILICONE 100CC (DRAIN) IMPLANT
GLOVE BIOGEL PI IND STRL 7.0 (GLOVE) IMPLANT
GLOVE BIOGEL PI INDICATOR 7.0 (GLOVE) ×1
GLOVE ECLIPSE 6.5 STRL STRAW (GLOVE) ×1 IMPLANT
GLOVE ECLIPSE 7.0 STRL STRAW (GLOVE) ×1 IMPLANT
GLOVE EUDERMIC 7 POWDERFREE (GLOVE) ×2 IMPLANT
GOWN STRL REUS W/ TWL LRG LVL3 (GOWN DISPOSABLE) ×1 IMPLANT
GOWN STRL REUS W/ TWL XL LVL3 (GOWN DISPOSABLE) ×1 IMPLANT
GOWN STRL REUS W/TWL LRG LVL3 (GOWN DISPOSABLE) ×4
GOWN STRL REUS W/TWL XL LVL3 (GOWN DISPOSABLE) ×2
KIT MARKER MARGIN INK (KITS) IMPLANT
LIQUID BAND (GAUZE/BANDAGES/DRESSINGS) ×2 IMPLANT
NDL HYPO 25X1 1.5 SAFETY (NEEDLE) IMPLANT
NEEDLE HYPO 25X1 1.5 SAFETY (NEEDLE) ×2 IMPLANT
NS IRRIG 1000ML POUR BTL (IV SOLUTION) ×2 IMPLANT
PACK BASIN DAY SURGERY FS (CUSTOM PROCEDURE TRAY) ×2 IMPLANT
PENCIL BUTTON HOLSTER BLD 10FT (ELECTRODE) ×2 IMPLANT
PIN SAFETY STERILE (MISCELLANEOUS) ×1 IMPLANT
SHEET MEDIUM DRAPE 40X70 STRL (DRAPES) IMPLANT
SLEEVE SCD COMPRESS KNEE MED (MISCELLANEOUS) ×2 IMPLANT
SPONGE GAUZE 4X4 12PLY STER LF (GAUZE/BANDAGES/DRESSINGS) IMPLANT
SPONGE LAP 18X18 X RAY DECT (DISPOSABLE) IMPLANT
SPONGE LAP 4X18 X RAY DECT (DISPOSABLE) ×2 IMPLANT
STRIP CLOSURE SKIN 1/2X4 (GAUZE/BANDAGES/DRESSINGS) ×1 IMPLANT
SUT ETHILON 3 0 FSL (SUTURE) IMPLANT
SUT MNCRL AB 4-0 PS2 18 (SUTURE) ×2 IMPLANT
SUT SILK 2 0 SH (SUTURE) ×2 IMPLANT
SUT VIC AB 2-0 CT1 27 (SUTURE)
SUT VIC AB 2-0 CT1 TAPERPNT 27 (SUTURE) IMPLANT
SUT VIC AB 3-0 SH 27 (SUTURE)
SUT VIC AB 3-0 SH 27X BRD (SUTURE) IMPLANT
SUT VICRYL 3-0 CR8 SH (SUTURE) ×2 IMPLANT
SYRINGE 10CC LL (SYRINGE) ×1 IMPLANT
TOWEL OR 17X24 6PK STRL BLUE (TOWEL DISPOSABLE) ×2 IMPLANT
TOWEL OR NON WOVEN STRL DISP B (DISPOSABLE) ×1 IMPLANT
TUBE CONNECTING 20X1/4 (TUBING) ×2 IMPLANT
YANKAUER SUCT BULB TIP NO VENT (SUCTIONS) ×2 IMPLANT

## 2014-08-24 NOTE — Anesthesia Postprocedure Evaluation (Signed)
  Anesthesia Post-op Note  Patient: Zoe Swanson  Procedure(s) Performed: Procedure(s): LEFT BREAST LUMPECTOMY WITH RADIOACTIVE SEED LOCALIZATION (Left)  Patient Location: PACU  Anesthesia Type: General   Level of Consciousness: awake, alert  and oriented  Airway and Oxygen Therapy: Patient Spontanous Breathing  Post-op Pain: mild  Post-op Assessment: Post-op Vital signs reviewed  Post-op Vital Signs: Reviewed  Last Vitals:  Filed Vitals:   08/24/14 1545  BP: 136/86  Pulse: 84  Temp: 36.9 C  Resp: 20    Complications: No apparent anesthesia complications

## 2014-08-24 NOTE — Op Note (Signed)
Patient Name:           Zoe Swanson   Date of Surgery:        08/24/2014  Pre op Diagnosis:      Left breast mass and calcifications  Post op Diagnosis:    Same  Procedure:                 Left breast lumpectomy with radioactive seed localization  Surgeon:                     Edsel Petrin. Dalbert Batman, M.D., FACS  Assistant:                      OR staff  Operative Indications:   The patient is a 38 year old female who presents with a breast mass. This 38 year old woman returns following radiographic evaluation of her left breast.  She has undergone 2 breast biopsies in the past for benign disease.  2 months ago she felt a lump under her left axilla which she states is now gone. She also noticed some left breast nipple discharge. When I saw her on September 18 I could elicit just a little bit of milky fluid. No breast mass.  Breast family history reveals breast cancer in a cousin. She thinks is one family member with ovarian cancer. Numerous family members with prostate cancer.  07/15/2014 she underwent bilateral mammograms and left breast ultrasound. The axilla was negative. There was a 12 mm masslike density and calcifications in the left breast at the 9:00 position 5 cm from the nipple. Image guided biopsy performed on October 6 reveals no calcifications. Pathology revealed benign breast tissue with fibrocystic change and adenosis. Six-month followup is offered. She stated she would like this area excised now. We discussed this in the office. I have offered left breast lumpectomy with radioactive seed localization and she agrees.   Operative Findings:       The reactivity was present in the left breast at the 9:00 position. The lobectomy specimen showed the biopsy marker clip, the radioactive seed, and a cluster of calcifications. There was no residual radioactivity in the breast after the lumpectomy was done  Procedure in Detail:          The radioactive seed was placed recently by the  radiologist. The patient was brought to the holding area. Using the neoprobe record I could  Identify the radioactivity as described above. The patient was taken to the operating room and underwent general anesthesia with an LMA device. The left breast was prepped and draped in a sterile fashion, intravenous antibiotic given, and surgical timeout was performed. 0.5% Marcaine with epinephrine was used as local infiltration anesthetic.      A transverse radially oriented incision was made in the left breast at the 9:00 position. Dissection was carried down into the breast tissue. The neoprobe was used frequently to guide the dissection. The specimen was removed and marked with silk sutures in 3 cardinal positions to orient the pathologist. Specimen mammogram looked good as described above. The specimen was passed off.   we irrigated the wound. Hemostasis was excellent. The breast tissues were closed with 3-0 Vicryl sutures and the skin closed with a running subcuticular 4-0 Monocryl and Dermabond. The patient tolerated the procedure well and was taken to PACU in stable condition. EBL 10 mL. Counts correct. Complications none.     Edsel Petrin. Dalbert Batman, M.D., FACS General and Minimally Invasive Surgery  Breast and Colorectal Surgery  08/24/2014 1:01 PM

## 2014-08-24 NOTE — Anesthesia Procedure Notes (Signed)
Procedure Name: LMA Insertion Date/Time: 08/24/2014 12:05 PM Performed by: Lyndee Leo Pre-anesthesia Checklist: Patient identified, Emergency Drugs available, Suction available and Patient being monitored Patient Re-evaluated:Patient Re-evaluated prior to inductionOxygen Delivery Method: Circle System Utilized Preoxygenation: Pre-oxygenation with 100% oxygen Intubation Type: IV induction Ventilation: Mask ventilation without difficulty LMA: LMA inserted LMA Size: 4.0 Number of attempts: 1 Airway Equipment and Method: bite block Placement Confirmation: positive ETCO2 Tube secured with: Tape Dental Injury: Teeth and Oropharynx as per pre-operative assessment

## 2014-08-24 NOTE — Transfer of Care (Signed)
Immediate Anesthesia Transfer of Care Note  Patient: Zoe Swanson  Procedure(s) Performed: Procedure(s): LEFT BREAST LUMPECTOMY WITH RADIOACTIVE SEED LOCALIZATION (Left)  Patient Location: PACU  Anesthesia Type:General  Level of Consciousness: sedated, unresponsive and obtunded  Airway & Oxygen Therapy: Patient Spontanous Breathing and Patient connected to face mask oxygen  Post-op Assessment: Report given to PACU RN and Post -op Vital signs reviewed and stable  Post vital signs: Reviewed and stable  Complications: No apparent anesthesia complications

## 2014-08-24 NOTE — Interval H&P Note (Signed)
History and Physical Interval Note:  08/24/2014 11:35 AM  Zoe Swanson  has presented today for surgery, with the diagnosis of left axillary mass  The various methods of treatment have been discussed with the patient and family. After consideration of risks, benefits and other options for treatment, the patient has consented to  Procedure(s): LEFT BREAST LUMPECTOMY WITH RADIOACTIVE SEED LOCALIZATION (Left) as a surgical intervention .  The patient's history has been reviewed, patient examined today., no change in status, stable for surgery.  I have reviewed the patient's chart and labs.  Questions were answered to the patient's satisfaction.     Adin Hector

## 2014-08-24 NOTE — Discharge Instructions (Signed)
[facility] °Office Phone Number [phone] ° °BREAST BIOPSY/ PARTIAL MASTECTOMY: POST OP INSTRUCTIONS ° °Always review your discharge instruction sheet given to you by the facility where your surgery was performed. ° °IF YOU HAVE DISABILITY OR FAMILY LEAVE FORMS, YOU MUST BRING THEM TO THE OFFICE FOR PROCESSING.  DO NOT GIVE THEM TO YOUR DOCTOR. ° °1. A prescription for pain medication may be given to you upon discharge.  Take your pain medication as prescribed, if needed.  If narcotic pain medicine is not needed, then you may take acetaminophen (Tylenol) or ibuprofen (Advil) as needed. °2. Take your usually prescribed medications unless otherwise directed °3. If you need a refill on your pain medication, please contact your pharmacy.  They will contact our office to request authorization.  Prescriptions will not be filled after 5pm or on week-ends. °4. You should eat very light the first 24 hours after surgery, such as soup, crackers, pudding, etc.  Resume your normal diet the day after surgery. °5. Most patients will experience some swelling and bruising in the breast.  Ice packs and a good support bra will help.  Swelling and bruising can take several days to resolve.  °6. It is common to experience some constipation if taking pain medication after surgery.  Increasing fluid intake and taking a stool softener will usually help or prevent this problem from occurring.  A mild laxative (Milk of Magnesia or Miralax) should be taken according to package directions if there are no bowel movements after 48 hours. °7. Unless discharge instructions indicate otherwise, you may remove your bandages 24-48 hours after surgery, and you may shower at that time.  You may have steri-strips (small skin tapes) in place directly over the incision.  These strips should be left on the skin for 7-10 days.  If your surgeon used skin glue on the incision, you may shower in 24 hours.  The glue will flake off over the  next 2-3 weeks.  Any sutures or staples will be removed at the office during your follow-up visit. °8. ACTIVITIES:  You may resume regular daily activities (gradually increasing) beginning the next day.  Wearing a good support bra or sports bra minimizes pain and swelling.  You may have sexual intercourse when it is comfortable. °a. You may drive when you no longer are taking prescription pain medication, you can comfortably wear a seatbelt, and you can safely maneuver your car and apply brakes. °b. RETURN TO WORK:  ______________________________________________________________________________________ °9. You should see your doctor in the office for a follow-up appointment approximately two weeks after your surgery.  Your doctor’s nurse will typically make your follow-up appointment when she calls you with your pathology report.  Expect your pathology report 2-3 business days after your surgery.  You may call to check if you do not hear from us after three days. °10. OTHER INSTRUCTIONS: _______________________________________________________________________________________________ _____________________________________________________________________________________________________________________________________ °_____________________________________________________________________________________________________________________________________ °_____________________________________________________________________________________________________________________________________ ° °WHEN TO CALL YOUR DOCTOR: °1. Fever over 101.0 °2. Nausea and/or vomiting. °3. Extreme swelling or bruising. °4. Continued bleeding from incision. °5. Increased pain, redness, or drainage from the incision. ° °The clinic staff is available to answer your questions during regular business hours.  Please don’t hesitate to call and ask to speak to one of the nurses for clinical concerns.  If you have a medical emergency, go to the nearest  emergency room or call 911.  A surgeon from [facility] is always on call at the hospital. ° °For further questions, please visit [email or web address]  ° ° °  Post Anesthesia Home Care Instructions ° °Activity: °Get plenty of rest for the remainder of the day. A responsible adult should stay with you for 24 hours following the procedure.  °For the next 24 hours, DO NOT: °-Drive a car °-Operate machinery °-Drink alcoholic beverages °-Take any medication unless instructed by your physician °-Make any legal decisions or sign important papers. ° °Meals: °Start with liquid foods such as gelatin or soup. Progress to regular foods as tolerated. Avoid greasy, spicy, heavy foods. If nausea and/or vomiting occur, drink only clear liquids until the nausea and/or vomiting subsides. Call your physician if vomiting continues. ° °Special Instructions/Symptoms: °Your throat may feel dry or sore from the anesthesia or the breathing tube placed in your throat during surgery. If this causes discomfort, gargle with warm salt water. The discomfort should disappear within 24 hours. ° °

## 2014-08-24 NOTE — Anesthesia Preprocedure Evaluation (Signed)
Anesthesia Evaluation  Patient identified by MRN, date of birth, ID band Patient awake    Reviewed: Allergy & Precautions, H&P , NPO status , Patient's Chart, lab work & pertinent test results  Airway Mallampati: I TM Distance: >3 FB Neck ROM: Full    Dental   Pulmonary          Cardiovascular hypertension, Pt. on medications     Neuro/Psych    GI/Hepatic   Endo/Other    Renal/GU      Musculoskeletal   Abdominal   Peds  Hematology   Anesthesia Other Findings   Reproductive/Obstetrics                           Anesthesia Physical Anesthesia Plan  ASA: II  Anesthesia Plan: General   Post-op Pain Management:    Induction: Intravenous  Airway Management Planned: LMA  Additional Equipment:   Intra-op Plan:   Post-operative Plan: Extubation in OR  Informed Consent: I have reviewed the patients History and Physical, chart, labs and discussed the procedure including the risks, benefits and alternatives for the proposed anesthesia with the patient or authorized representative who has indicated his/her understanding and acceptance.     Plan Discussed with: CRNA and Surgeon  Anesthesia Plan Comments:         Anesthesia Quick Evaluation  

## 2014-08-27 ENCOUNTER — Telehealth (INDEPENDENT_AMBULATORY_CARE_PROVIDER_SITE_OTHER): Payer: Self-pay | Admitting: General Surgery

## 2014-08-27 NOTE — Telephone Encounter (Signed)
Final pathology report shows benign intraductal papilloma. I called and Ms. Zoe Swanson and discussed this with her. She expressed good understanding. She is having no problems with wound healing. I will discuss in detail at the next office visit.  Fanny Skates

## 2014-09-07 ENCOUNTER — Telehealth: Payer: Self-pay | Admitting: Family Medicine

## 2014-09-07 NOTE — Telephone Encounter (Signed)
Christy from [facility] with a FYI to Dr. Sarajane Jews to let him know that Zoe Swanson is participating a nurse case management program.Do not need a return call.

## 2014-09-10 NOTE — Telephone Encounter (Signed)
noted 

## 2014-11-21 ENCOUNTER — Other Ambulatory Visit: Payer: Self-pay | Admitting: Family Medicine

## 2014-11-21 NOTE — Telephone Encounter (Signed)
Call in #60 with 5 rf 

## 2014-11-22 ENCOUNTER — Other Ambulatory Visit: Payer: Self-pay | Admitting: *Deleted

## 2014-11-23 ENCOUNTER — Other Ambulatory Visit: Payer: Self-pay | Admitting: *Deleted

## 2014-11-23 ENCOUNTER — Other Ambulatory Visit: Payer: Self-pay | Admitting: Family Medicine

## 2014-11-23 NOTE — Telephone Encounter (Signed)
[facility] requests a refill on Alprazolam 1mg -1 twice a day prn.

## 2014-12-05 ENCOUNTER — Ambulatory Visit (INDEPENDENT_AMBULATORY_CARE_PROVIDER_SITE_OTHER): Payer: BC Managed Care – PPO | Admitting: Family Medicine

## 2014-12-05 ENCOUNTER — Encounter: Payer: Self-pay | Admitting: Family Medicine

## 2014-12-05 VITALS — BP 132/91 | HR 93 | Temp 98.8°F | Ht 66.0 in | Wt 182.0 lb

## 2014-12-05 DIAGNOSIS — L02439 Carbuncle of limb, unspecified: Secondary | ICD-10-CM

## 2014-12-05 DIAGNOSIS — L02429 Furuncle of limb, unspecified: Secondary | ICD-10-CM

## 2014-12-05 DIAGNOSIS — Z23 Encounter for immunization: Secondary | ICD-10-CM

## 2014-12-05 MED ORDER — DOXYCYCLINE HYCLATE 100 MG PO CAPS: 100.0000 mg | ORAL_CAPSULE | Freq: Every day | ORAL | Status: AC

## 2014-12-05 NOTE — Progress Notes (Signed)
Pre visit review using our clinic review tool, if applicable. No additional management support is needed unless otherwise documented below in the visit note. 

## 2014-12-05 NOTE — Progress Notes (Signed)
   Subjective:    Patient ID: Zoe Swanson, female    DOB: [birth year], 39 y.o.   MRN: [id number]  HPI Here for recurrent boils in the armpits. These come and go and they usually resolve without treatment. She averages getting one every month or two. Currently she has one on the left axilla that showed up about 2 weeks ago. It gets tender at times.    Review of Systems  Constitutional: Negative.        Objective:   Physical Exam  Constitutional: She appears well-developed and well-nourished.  Skin:  Small tender cyst under the skin of the left axilla           Assessment & Plan:  Recurrent boils. Start on Doxycycline 100 mg once a day for prevention.

## 2014-12-05 NOTE — Addendum Note (Signed)
Addended by: Aggie Hacker A on: 12/05/2014 12:52 PM   Modules accepted: Orders

## 2015-01-09 ENCOUNTER — Other Ambulatory Visit: Payer: Self-pay | Admitting: Family Medicine

## 2015-03-13 ENCOUNTER — Other Ambulatory Visit: Payer: Self-pay | Admitting: Family Medicine

## 2015-03-14 NOTE — Telephone Encounter (Signed)
Refill for 6 months. 

## 2015-03-14 NOTE — Telephone Encounter (Signed)
duplicate

## 2015-04-23 ENCOUNTER — Other Ambulatory Visit: Payer: Self-pay | Admitting: Obstetrics & Gynecology

## 2015-04-24 LAB — CYTOLOGY - PAP

## 2015-05-20 ENCOUNTER — Other Ambulatory Visit: Payer: Self-pay | Admitting: *Deleted

## 2015-05-20 ENCOUNTER — Telehealth: Payer: Self-pay | Admitting: Family Medicine

## 2015-05-20 MED ORDER — CYCLOBENZAPRINE HCL 10 MG PO TABS: ORAL_TABLET | ORAL | Status: DC

## 2015-05-20 NOTE — Telephone Encounter (Signed)
Call in #60 with 5 rf 

## 2015-05-20 NOTE — Telephone Encounter (Signed)
Refill request for Alprazolam 1 mg take 1 po bid prn and send to [facility].

## 2015-05-21 MED ORDER — ALPRAZOLAM 1 MG PO TABS: 1.0000 mg | ORAL_TABLET | Freq: Two times a day (BID) | ORAL | Status: DC | PRN

## 2015-05-21 NOTE — Telephone Encounter (Signed)
I called in script 

## 2015-09-10 ENCOUNTER — Telehealth: Payer: Self-pay | Admitting: Family Medicine

## 2015-09-10 DIAGNOSIS — M25562 Pain in left knee: Secondary | ICD-10-CM

## 2015-09-10 NOTE — Telephone Encounter (Signed)
Pt has neuro stimulator in her spine and has plans to put in an upgraded version , (pt will now be allowed to get MRI's) Due to travel issues, pt is swtiching from [place] to this new dr in [place],. She will be a new pt with this new dr and they are requesting a referral from pt's PCP. Pt's representative is Social worker.  [facility] Kandace Blitz, MD  [phone]  [address] [place] [address]

## 2015-09-10 NOTE — Telephone Encounter (Signed)
I could do this but I need more information first. One, what specialty is this new doctor? And two, what is the diagnosis that the stimulator is treating (the complex regional pain syndrome in the leg maybe?)

## 2015-09-11 NOTE — Telephone Encounter (Signed)
I tried to reach pt again, no answer. Can you try again later on today? We just need to answer 2 questions from below?

## 2015-09-11 NOTE — Telephone Encounter (Signed)
I left a voice message for pt with below information.  

## 2015-09-13 NOTE — Telephone Encounter (Signed)
Pt left a message, reason CPRS left leg extreme pain, not sure what the specialty is?

## 2015-09-13 NOTE — Telephone Encounter (Signed)
I spoke with pt  

## 2015-09-13 NOTE — Telephone Encounter (Signed)
Referral was done  

## 2015-10-12 ENCOUNTER — Other Ambulatory Visit: Payer: Self-pay | Admitting: Family Medicine

## 2015-10-14 NOTE — Telephone Encounter (Signed)
Call in #[phone] with 5 rf 

## 2015-12-02 ENCOUNTER — Other Ambulatory Visit: Payer: Self-pay | Admitting: Family Medicine

## 2015-12-03 ENCOUNTER — Other Ambulatory Visit: Payer: Self-pay | Admitting: Family Medicine

## 2015-12-03 NOTE — Telephone Encounter (Signed)
Call in #60 with 5 rf 

## 2016-01-20 ENCOUNTER — Telehealth: Payer: Self-pay | Admitting: Family Medicine

## 2016-01-20 NOTE — Telephone Encounter (Signed)
She needs an OV as soon as possible

## 2016-01-20 NOTE — Telephone Encounter (Signed)
[facility] - Client [facility]  Patient Name: Zoe Swanson  DOB: 12/23/1975    Initial Comment Caller States BP 183/20 dizzy, nausea   Nurse Assessment  Nurse: Wynetta Emery, RN, Baker Janus Date/Time (Eastern Time): 01/20/2016 10:43:16 AM  Confirm and document reason for call. If symptomatic, describe symptoms. You must click the next button to save text entered. ---Blood pressure issues and feeling off alot lately 183/120 head hurting and dizziness.  Has the patient traveled out of the country within the last 30 days? ---No  Does the patient have any new or worsening symptoms? ---Yes  Will a triage be completed? ---Yes  Related visit to physician within the last 2 weeks? ---No  Does the PT have any chronic conditions? (i.e. diabetes, asthma, etc.) ---Yes  List chronic conditions. ---HTN  Is the patient pregnant or possibly pregnant? (Ask all females between the ages of 9-55) ---No  Is this a behavioral health or substance abuse call? ---No     Guidelines    Guideline Title Affirmed Question Affirmed Notes  High Blood Pressure BP ? 180/110    Final Disposition User   See Physician within 24 Hours [place], RN, Baker Janus    Comments  NOTE: she does not want an appt wants to lay down and rest before going to work. States if she still feels bad will go to ED later on today. Could not talk her in to an appt.   Referrals  REFERRED TO PCP OFFICE   Disagree/Comply: Comply

## 2016-01-20 NOTE — Telephone Encounter (Signed)
I spoke with pt and she agreed to go to [facility] ER now. Her latest blood pressure reading was 160/108 and she is still feeling dizzy. I did give Dr. Sarajane Jews this update.

## 2016-01-24 ENCOUNTER — Ambulatory Visit (INDEPENDENT_AMBULATORY_CARE_PROVIDER_SITE_OTHER): Payer: BC Managed Care – PPO | Admitting: Family Medicine

## 2016-01-24 ENCOUNTER — Encounter: Payer: Self-pay | Admitting: Family Medicine

## 2016-01-24 VITALS — BP 135/96 | HR 88 | Temp 98.4°F | Ht 66.0 in | Wt 180.0 lb

## 2016-01-24 DIAGNOSIS — I10 Essential (primary) hypertension: Secondary | ICD-10-CM

## 2016-01-24 MED ORDER — HYDROCHLOROTHIAZIDE 25 MG PO TABS: 25.0000 mg | ORAL_TABLET | Freq: Every day | ORAL | Status: DC

## 2016-01-24 MED ORDER — AMLODIPINE BESYLATE 5 MG PO TABS: 5.0000 mg | ORAL_TABLET | Freq: Every day | ORAL | Status: DC

## 2016-01-24 NOTE — Progress Notes (Signed)
Pre visit review using our clinic review tool, if applicable. No additional management support is needed unless otherwise documented below in the visit note. 

## 2016-01-27 ENCOUNTER — Encounter: Payer: Self-pay | Admitting: Family Medicine

## 2016-01-27 NOTE — Progress Notes (Signed)
   Subjective:    Patient ID: Zoe Swanson, female    DOB: 1975-11-05, 40 y.o.   MRN: [id number]  HPI Here for elevated BP. She had been doing well on HCTZ and Amlodipine so she thought she wouldn't need them any more. She stopped taking them about 3 months ago, however her BP at home has gone back up to the 140s or 150s over 90s. She still feels well.    Review of Systems  Constitutional: Negative.   Respiratory: Negative.   Cardiovascular: Negative.   Neurological: Negative.        Objective:   Physical Exam  Constitutional: She is oriented to person, place, and time. She appears well-developed and well-nourished.  Neck: No thyromegaly present.  Cardiovascular: Normal rate, regular rhythm, normal heart sounds and intact distal pulses.   Pulmonary/Chest: Effort normal and breath sounds normal.  Musculoskeletal: She exhibits no edema.  Lymphadenopathy:    She has no cervical adenopathy.  Neurological: She is alert and oriented to person, place, and time.          Assessment & Plan:  HTN. She will start back on both of these meds. She will exercise and watch her sodium intake. Recheck one month. Laurey Morale, MD

## 2016-02-05 DIAGNOSIS — J4530 Mild persistent asthma, uncomplicated: Secondary | ICD-10-CM | POA: Diagnosis not present

## 2016-02-05 DIAGNOSIS — H1045 Other chronic allergic conjunctivitis: Secondary | ICD-10-CM | POA: Diagnosis not present

## 2016-02-05 DIAGNOSIS — R21 Rash and other nonspecific skin eruption: Secondary | ICD-10-CM | POA: Diagnosis not present

## 2016-02-05 DIAGNOSIS — J301 Allergic rhinitis due to pollen: Secondary | ICD-10-CM | POA: Diagnosis not present

## 2016-02-13 DIAGNOSIS — J301 Allergic rhinitis due to pollen: Secondary | ICD-10-CM | POA: Diagnosis not present

## 2016-02-14 DIAGNOSIS — J3089 Other allergic rhinitis: Secondary | ICD-10-CM | POA: Diagnosis not present

## 2016-02-24 DIAGNOSIS — Z9689 Presence of other specified functional implants: Secondary | ICD-10-CM | POA: Diagnosis not present

## 2016-02-24 DIAGNOSIS — G894 Chronic pain syndrome: Secondary | ICD-10-CM | POA: Diagnosis not present

## 2016-02-24 DIAGNOSIS — M545 Low back pain: Secondary | ICD-10-CM | POA: Diagnosis not present

## 2016-02-24 DIAGNOSIS — G90522 Complex regional pain syndrome I of left lower limb: Secondary | ICD-10-CM | POA: Diagnosis not present

## 2016-02-28 DIAGNOSIS — R222 Localized swelling, mass and lump, trunk: Secondary | ICD-10-CM | POA: Diagnosis not present

## 2016-03-16 DIAGNOSIS — G894 Chronic pain syndrome: Secondary | ICD-10-CM | POA: Diagnosis not present

## 2016-03-16 DIAGNOSIS — M546 Pain in thoracic spine: Secondary | ICD-10-CM | POA: Diagnosis not present

## 2016-03-16 DIAGNOSIS — M79604 Pain in right leg: Secondary | ICD-10-CM | POA: Diagnosis not present

## 2016-03-16 DIAGNOSIS — G90522 Complex regional pain syndrome I of left lower limb: Secondary | ICD-10-CM | POA: Diagnosis not present

## 2016-03-18 DIAGNOSIS — J301 Allergic rhinitis due to pollen: Secondary | ICD-10-CM | POA: Diagnosis not present

## 2016-03-18 DIAGNOSIS — J3089 Other allergic rhinitis: Secondary | ICD-10-CM | POA: Diagnosis not present

## 2016-03-20 DIAGNOSIS — J301 Allergic rhinitis due to pollen: Secondary | ICD-10-CM | POA: Diagnosis not present

## 2016-03-20 DIAGNOSIS — J3089 Other allergic rhinitis: Secondary | ICD-10-CM | POA: Diagnosis not present

## 2016-04-08 DIAGNOSIS — G894 Chronic pain syndrome: Secondary | ICD-10-CM | POA: Diagnosis not present

## 2016-04-08 DIAGNOSIS — G9050 Complex regional pain syndrome I, unspecified: Secondary | ICD-10-CM | POA: Diagnosis not present

## 2016-04-08 DIAGNOSIS — T85840A Pain due to nervous system prosthetic devices, implants and grafts, initial encounter: Secondary | ICD-10-CM | POA: Diagnosis not present

## 2016-04-27 ENCOUNTER — Other Ambulatory Visit: Payer: Self-pay | Admitting: Family Medicine

## 2016-04-29 DIAGNOSIS — Z1231 Encounter for screening mammogram for malignant neoplasm of breast: Secondary | ICD-10-CM | POA: Diagnosis not present

## 2016-04-29 DIAGNOSIS — Z01419 Encounter for gynecological examination (general) (routine) without abnormal findings: Secondary | ICD-10-CM | POA: Diagnosis not present

## 2016-04-29 DIAGNOSIS — R5383 Other fatigue: Secondary | ICD-10-CM | POA: Diagnosis not present

## 2016-04-29 DIAGNOSIS — R635 Abnormal weight gain: Secondary | ICD-10-CM | POA: Diagnosis not present

## 2016-04-29 DIAGNOSIS — Z6830 Body mass index (BMI) 30.0-30.9, adult: Secondary | ICD-10-CM | POA: Diagnosis not present

## 2016-04-29 NOTE — Telephone Encounter (Signed)
Call in #[phone] with 5 rf 

## 2016-06-05 ENCOUNTER — Other Ambulatory Visit: Payer: Self-pay | Admitting: *Deleted

## 2016-06-05 NOTE — Telephone Encounter (Signed)
Pt need new Rx for Xanax   Pharm:  [facility] [address]  Pt will be flying out tonight and would like to have it today if possible.

## 2016-06-05 NOTE — Telephone Encounter (Signed)
Please fill

## 2016-06-06 IMAGING — MG MM DIAGNOSTIC BILATERAL
7 series · 7 of 7 positions shown · non-contrast
Comparison: 11/19/2006

CLINICAL DATA: Patient is a 37-year-old female with a personal
history of chronic left nipple discharge. Patient had a surgical
excision in 9996 with pathology of papilloma and reports continued
unilateral discharge. She describes this as spontaneous and
primarily white or yellow, though occasionally dark red. She also
reports a palpable concern in the region of the left axilla that has
resolved in the interval.

EXAM:
DIGITAL DIAGNOSTIC  BILATERAL MAMMOGRAM WITH CAD
ULTRASOUND LEFT BREAST

[R CC]
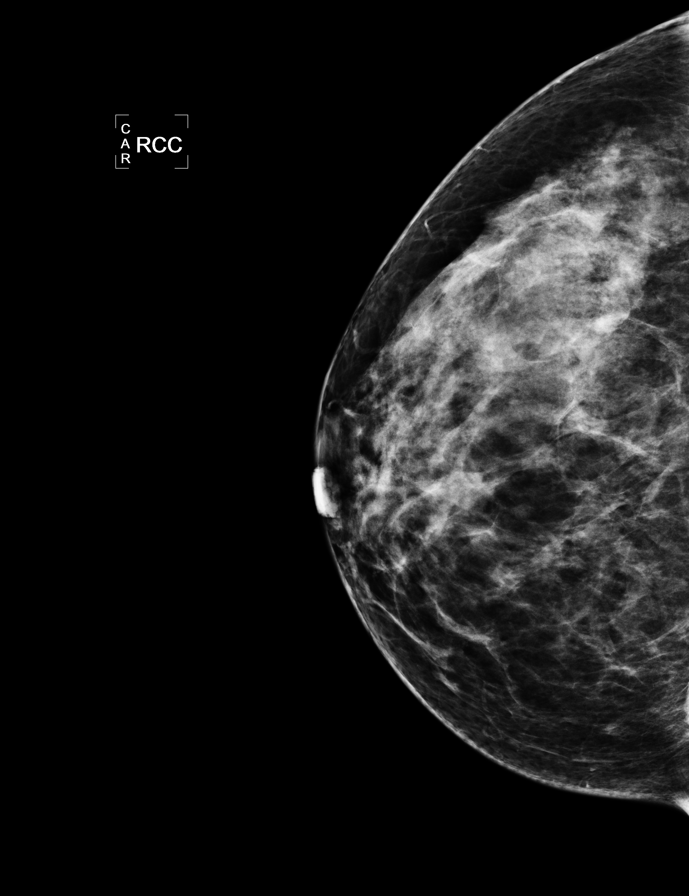

[L CC (1 of 2)]
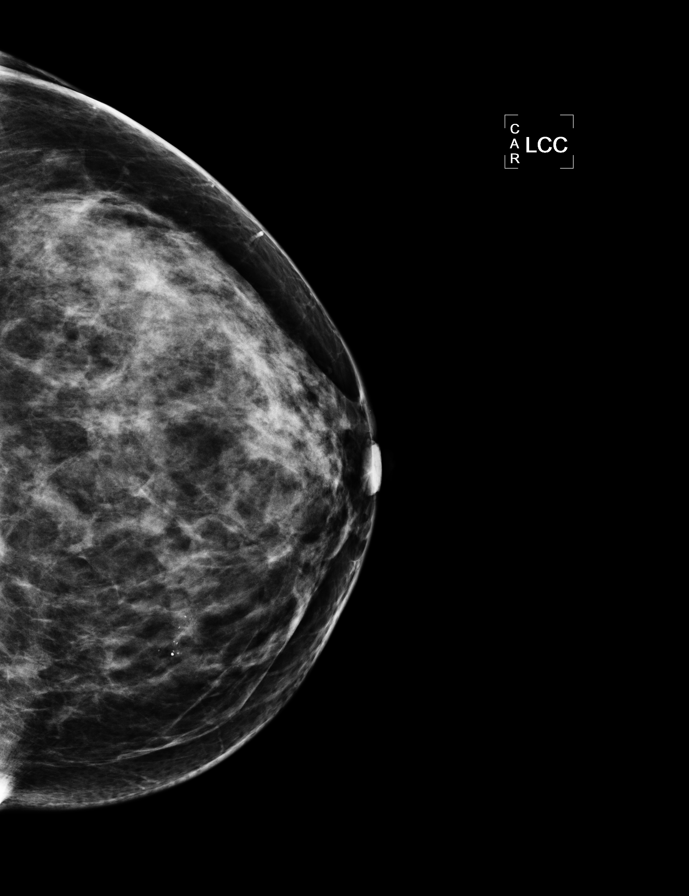

[L MLO]
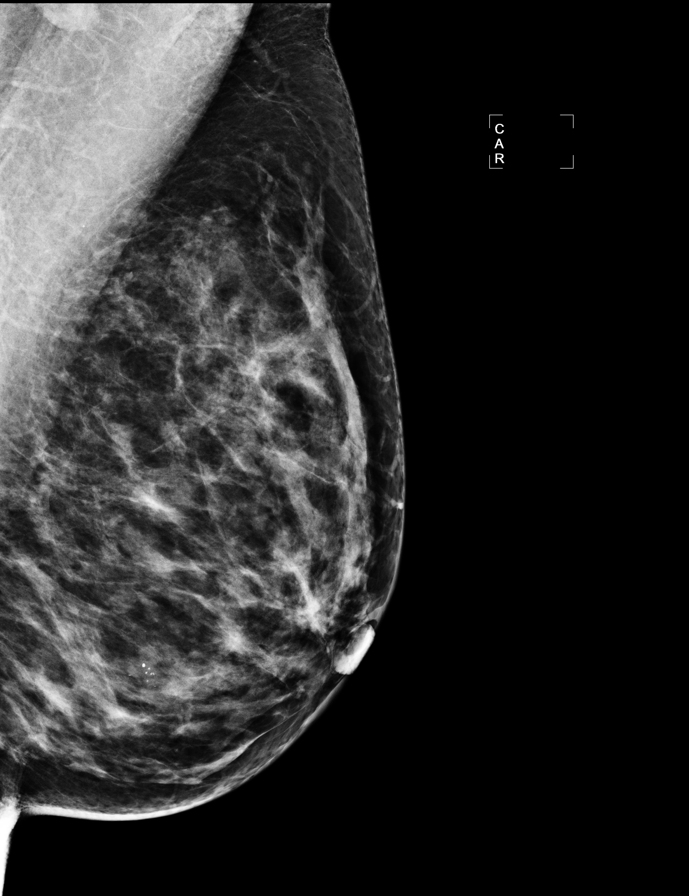

[R MLO]
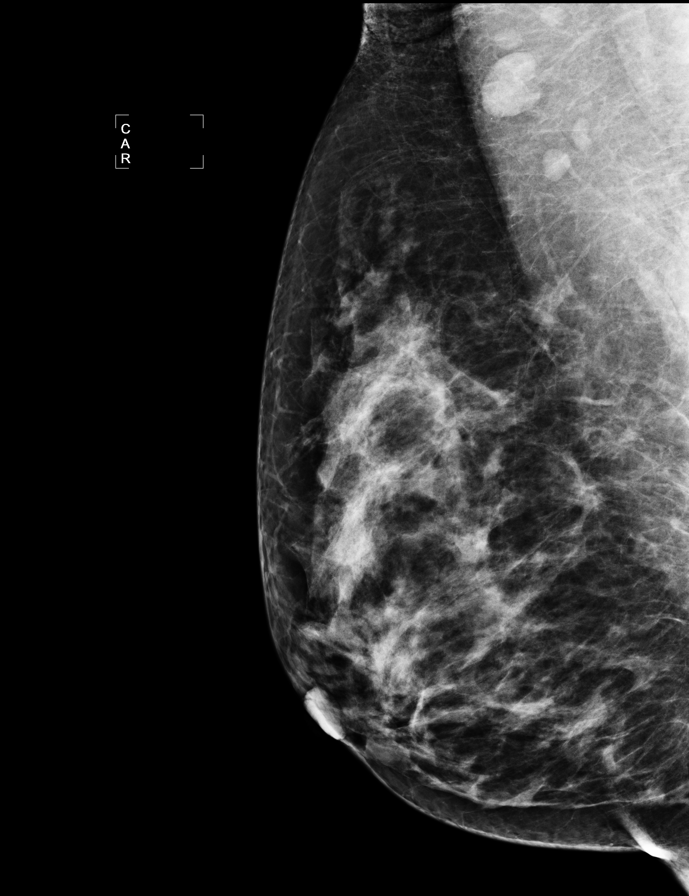

[L TAN]
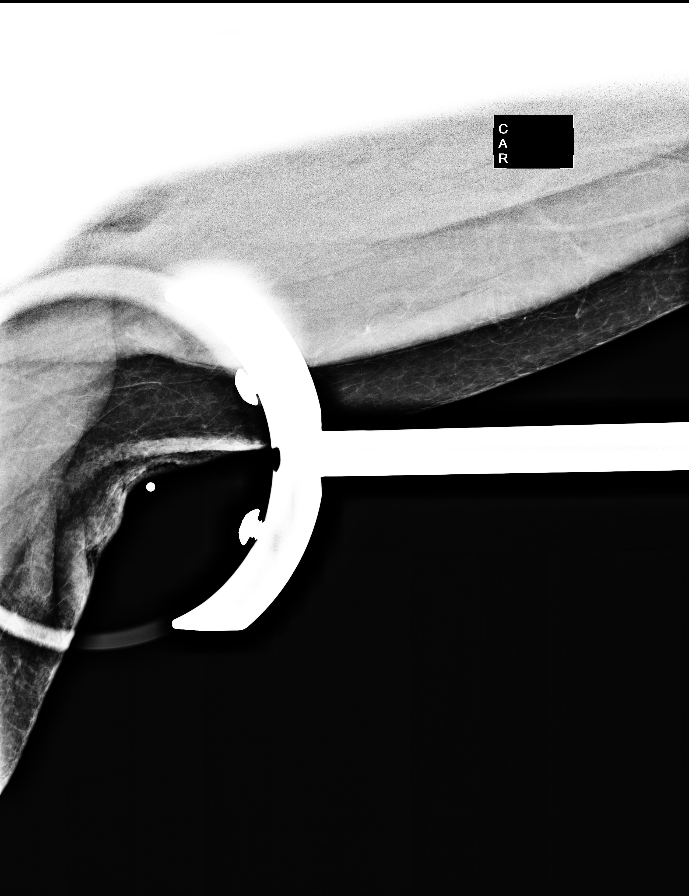

[L ML]
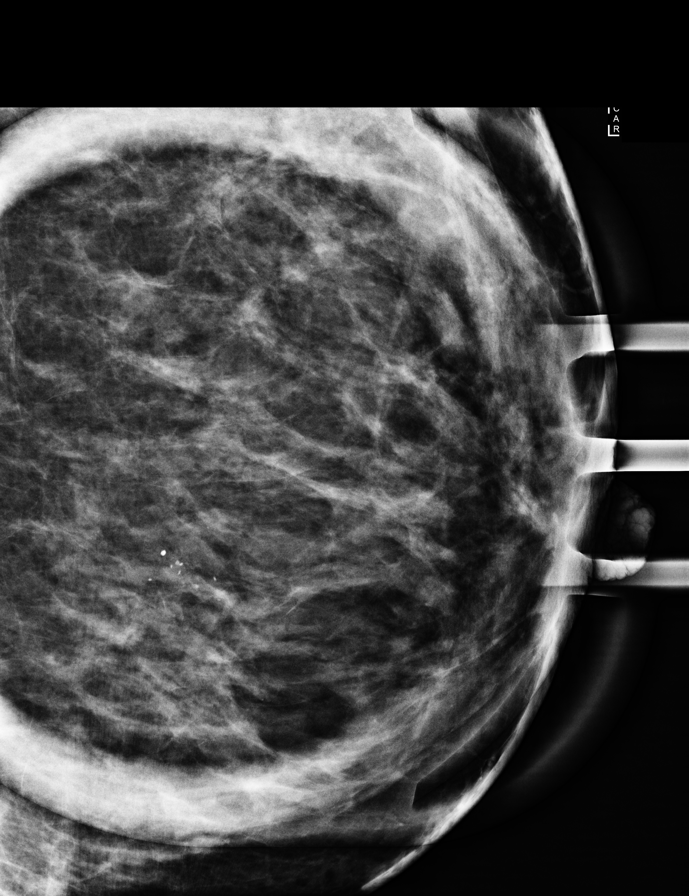

[L CC (2 of 2)]
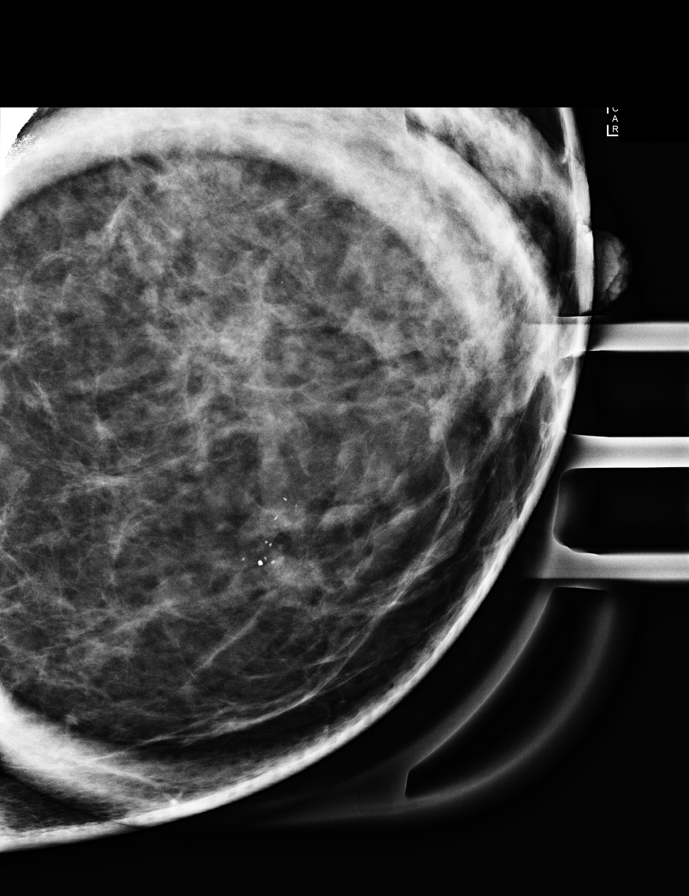

[7 of 7 positions shown; findings below may reference images not displayed]

ACR Breast Density Category c: The breast tissue is heterogeneously
dense, which may obscure small masses.
FINDINGS: Digital bilateral diagnostic mammography demonstrates a
heterogeneously dense parenchymal pattern which may obscure
detection of small masses. Spot-compression view in the region of
the left axilla demonstrates effacement of normal fibroglandular
tissue. Spot-compression magnification views of the medial breast
reveal loosely grouped relatively high-density calcifications with
round morphology. No new mass or suspicious microcalcifications.

Mammographic images were processed with CAD.

On physical exam, no palpable masses are noted.

Targeted ultrasound is performed. This demonstrates a 12 x 4 x 7 mm
diameter mass in the medial breast at 9 o'clock 5 cm from the
nipple. This demonstrates sonographic features most consistent with
ectatic ducts with intraductal debris versus intraductal mass. This
represents a probable sonographic correlate to the previously noted
microcalcifications on mammography. Ultrasound evaluation of the
prior palpable area of concern in the left axilla demonstrates no
conspicuous cystic or solid mass.
IMPRESSION: 1. A 12 mm diameter mass in the medial breast at 9 o'clock 5 cm from
the nipple is suspicious with primary differential diagnosis of
intraductal debris versus intraductal mass in this patient with a
history of unilateral left nipple discharge.
2. Microcalcifications in the medial breast demonstrate probably
benign features and may correlate to the aforementioned mass noted
on ultrasound.

RECOMMENDATION:
1. Recommend ultrasound-guided core needle biopsy of the left breast
mass.
2. If the microcalcifications in the medial left breast do not
correlate to the mass biopsied under ultrasound guidance, recommend
short-term follow-up imaging with left diagnostic mammography in 6
months.

I have discussed the findings and recommendations with the patient.
Results were also provided in writing at the conclusion of the
visit. If applicable, a reminder letter will be sent to the patient
regarding the next appointment.

BI-RADS CATEGORY  4: Suspicious.

## 2016-06-08 MED ORDER — ALPRAZOLAM 1 MG PO TABS: 1.0000 mg | ORAL_TABLET | Freq: Two times a day (BID) | ORAL | 5 refills | Status: DC | PRN

## 2016-06-08 NOTE — Telephone Encounter (Signed)
Rx called in 

## 2016-08-04 ENCOUNTER — Encounter: Payer: Self-pay | Admitting: Family Medicine

## 2016-08-04 ENCOUNTER — Ambulatory Visit (INDEPENDENT_AMBULATORY_CARE_PROVIDER_SITE_OTHER): Payer: BLUE CROSS/BLUE SHIELD | Admitting: Family Medicine

## 2016-08-04 VITALS — BP 144/103 | HR 105 | Temp 98.5°F | Ht 66.0 in | Wt 186.0 lb

## 2016-08-04 DIAGNOSIS — R200 Anesthesia of skin: Secondary | ICD-10-CM | POA: Diagnosis not present

## 2016-08-04 DIAGNOSIS — R739 Hyperglycemia, unspecified: Secondary | ICD-10-CM | POA: Diagnosis not present

## 2016-08-04 DIAGNOSIS — R202 Paresthesia of skin: Secondary | ICD-10-CM | POA: Diagnosis not present

## 2016-08-04 DIAGNOSIS — R5383 Other fatigue: Secondary | ICD-10-CM

## 2016-08-04 DIAGNOSIS — I10 Essential (primary) hypertension: Secondary | ICD-10-CM

## 2016-08-04 LAB — HEPATIC FUNCTION PANEL
ALBUMIN: 4.5 g/dL (ref 3.5–5.2)
ALT: 16 U/L (ref 0–35)
AST: 17 U/L (ref 0–37)
Alkaline Phosphatase: 91 U/L (ref 39–117)
BILIRUBIN DIRECT: 0.1 mg/dL (ref 0.0–0.3)
TOTAL PROTEIN: 8 g/dL (ref 6.0–8.3)
Total Bilirubin: 0.2 mg/dL (ref 0.2–1.2)

## 2016-08-04 LAB — BASIC METABOLIC PANEL
BUN: 15 mg/dL (ref 6–23)
CO2: 28 mEq/L (ref 19–32)
Calcium: 10.2 mg/dL (ref 8.4–10.5)
Chloride: 100 mEq/L (ref 96–112)
Creatinine, Ser: 0.86 mg/dL (ref 0.40–1.20)
GFR: 93.12 mL/min (ref >60.00)
Glucose, Bld: 84 mg/dL (ref 70–99)
POTASSIUM: 3.9 meq/L (ref 3.5–5.1)
Sodium: 138 mEq/L (ref 135–145)

## 2016-08-04 LAB — CBC WITH DIFFERENTIAL/PLATELET
BASOS ABS: 0.1 10*3/uL (ref 0.0–0.1)
BASOS PCT: 0.9 % (ref 0.0–3.0)
EOS ABS: 0.2 10*3/uL (ref 0.0–0.7)
Eosinophils Relative: 1.8 % (ref 0.0–5.0)
HEMATOCRIT: 39.4 % (ref 36.0–46.0)
HEMOGLOBIN: 12.9 g/dL (ref 12.0–15.0)
LYMPHS PCT: 26.5 % (ref 12.0–46.0)
Lymphs Abs: 2.8 10*3/uL (ref 0.7–4.0)
MCHC: 32.8 g/dL (ref 30.0–36.0)
MCV: 80.2 fl (ref 78.0–100.0)
MONOS PCT: 6.9 % (ref 3.0–12.0)
Monocytes Absolute: 0.7 10*3/uL (ref 0.1–1.0)
Neutro Abs: 6.7 10*3/uL (ref 1.4–7.7)
Neutrophils Relative %: 63.9 % (ref 43.0–77.0)
Platelets: 423 10*3/uL — ABNORMAL HIGH (ref 150.0–400.0)
RBC: 4.92 Mil/uL (ref 3.87–5.11)
RDW: 16.8 % — ABNORMAL HIGH (ref 11.5–15.5)
WBC: 10.4 10*3/uL (ref 4.0–10.5)

## 2016-08-04 LAB — VITAMIN B12: Vitamin B-12: 1476 pg/mL — ABNORMAL HIGH (ref 211–911)

## 2016-08-04 LAB — TSH: TSH: 2.04 u[IU]/mL (ref 0.35–4.50)

## 2016-08-04 LAB — HEMOGLOBIN A1C: HEMOGLOBIN A1C: 5.7 % (ref 4.6–6.5)

## 2016-08-04 MED ORDER — AMLODIPINE BESYLATE 10 MG PO TABS: 10.0000 mg | ORAL_TABLET | Freq: Every day | ORAL | 3 refills | Status: DC

## 2016-08-04 NOTE — Progress Notes (Signed)
Pre visit review using our clinic review tool, if applicable. No additional management support is needed unless otherwise documented below in the visit note. 

## 2016-08-04 NOTE — Progress Notes (Signed)
   Subjective:    Patient ID: Zoe Swanson, female    DOB: 05-04-1976, 40 y.o.   MRN: [id number]  HPI Here for elevated BP and for other issues. She has felt very fatigued for several months, and she has intermittent numbness in the left hand. She has a strong family hx of diabetes.    Review of Systems  Constitutional: Positive for fatigue. Negative for unexpected weight change.  Respiratory: Negative.   Cardiovascular: Negative.   Gastrointestinal: Negative.   Neurological: Positive for numbness. Negative for weakness.       Objective:   Physical Exam  Constitutional: She is oriented to person, place, and time. She appears well-developed and well-nourished.  Neck: No thyromegaly present.  Cardiovascular: Normal rate, regular rhythm, normal heart sounds and intact distal pulses.   Pulmonary/Chest: Effort normal and breath sounds normal.  Musculoskeletal: She exhibits no edema.  Lymphadenopathy:    She has no cervical adenopathy.  Neurological: She is alert and oriented to person, place, and time.          Assessment & Plan:  Her HTN is not well controlled so we will increase the Amlodipine to 10 mg daily. This may be part of the reason for her fatigue. We will also get labs today to check renal status, an A1c, a B12 level etc. This may help explain the numbness also.

## 2016-08-07 ENCOUNTER — Telehealth: Payer: Self-pay | Admitting: Family Medicine

## 2016-08-07 NOTE — Telephone Encounter (Signed)
Pt would like blood work results °

## 2016-08-11 NOTE — Telephone Encounter (Signed)
I spoke with pt and went over results. 

## 2016-08-13 ENCOUNTER — Other Ambulatory Visit: Payer: Self-pay | Admitting: Family Medicine

## 2016-10-30 ENCOUNTER — Other Ambulatory Visit: Payer: Self-pay | Admitting: Family Medicine

## 2016-11-01 NOTE — Telephone Encounter (Signed)
Call in #[phone] with 5 rf 

## 2016-11-09 DIAGNOSIS — R87610 Atypical squamous cells of undetermined significance on cytologic smear of cervix (ASC-US): Secondary | ICD-10-CM | POA: Diagnosis not present

## 2016-11-09 DIAGNOSIS — N920 Excessive and frequent menstruation with regular cycle: Secondary | ICD-10-CM | POA: Diagnosis not present

## 2016-11-19 DIAGNOSIS — N924 Excessive bleeding in the premenopausal period: Secondary | ICD-10-CM | POA: Diagnosis not present

## 2016-11-29 ENCOUNTER — Other Ambulatory Visit: Payer: Self-pay | Admitting: Family Medicine

## 2016-11-30 NOTE — Telephone Encounter (Signed)
Call in #60 with 5 rf 

## 2016-12-23 DIAGNOSIS — H5213 Myopia, bilateral: Secondary | ICD-10-CM | POA: Diagnosis not present

## 2017-02-24 ENCOUNTER — Other Ambulatory Visit: Payer: Self-pay | Admitting: Family Medicine

## 2017-03-05 DIAGNOSIS — F331 Major depressive disorder, recurrent, moderate: Secondary | ICD-10-CM | POA: Diagnosis not present

## 2017-03-05 DIAGNOSIS — F411 Generalized anxiety disorder: Secondary | ICD-10-CM | POA: Diagnosis not present

## 2017-03-29 ENCOUNTER — Other Ambulatory Visit: Payer: Self-pay | Admitting: Family Medicine

## 2017-04-05 ENCOUNTER — Ambulatory Visit (INDEPENDENT_AMBULATORY_CARE_PROVIDER_SITE_OTHER): Payer: BLUE CROSS/BLUE SHIELD | Admitting: Family Medicine

## 2017-04-05 ENCOUNTER — Encounter: Payer: Self-pay | Admitting: Family Medicine

## 2017-04-05 VITALS — BP 131/95 | HR 106 | Temp 98.6°F | Ht 66.0 in | Wt 180.0 lb

## 2017-04-05 DIAGNOSIS — J302 Other seasonal allergic rhinitis: Secondary | ICD-10-CM | POA: Diagnosis not present

## 2017-04-05 DIAGNOSIS — I10 Essential (primary) hypertension: Secondary | ICD-10-CM | POA: Diagnosis not present

## 2017-04-05 MED ORDER — AMLODIPINE BESYLATE 10 MG PO TABS: 5.0000 mg | ORAL_TABLET | Freq: Every day | ORAL | 3 refills | Status: DC

## 2017-04-05 MED ORDER — METHYLPREDNISOLONE ACETATE 80 MG/ML IJ SUSP
120.0000 mg | Freq: Once | INTRAMUSCULAR | Status: AC
  Administered 2017-04-05: 120 mg via INTRAMUSCULAR

## 2017-04-05 MED ORDER — METOPROLOL SUCCINATE ER 50 MG PO TB24: 50.0000 mg | ORAL_TABLET | Freq: Every day | ORAL | 3 refills | Status: DC

## 2017-04-05 NOTE — Progress Notes (Signed)
   Subjective:    Patient ID: Zoe Swanson, female    DOB: 16-Oct-1976, 41 y.o.   MRN: [id number]  HPI Here for 2 weeks of elevated BP to 140s over 90s, mild headaches, and lightheadedness. No chest pain or SOB. She has also had a lot of swelling in the ankles at times. Also she has had allergy issues with itchy eyes and sneezing. Benadryl and Claritin have not helped.    Review of Systems  Constitutional: Negative.   HENT: Positive for postnasal drip, rhinorrhea, sinus pain, sinus pressure and sneezing. Negative for congestion, ear pain and sore throat.   Eyes: Positive for itching. Negative for discharge and redness.  Respiratory: Negative.   Cardiovascular: Positive for leg swelling. Negative for chest pain and palpitations.  Neurological: Positive for light-headedness and headaches.       Objective:   Physical Exam  Constitutional: She is oriented to person, place, and time. She appears well-developed and well-nourished.  HENT:  Right Ear: External ear normal.  Left Ear: External ear normal.  Nose: Nose normal.  Mouth/Throat: Oropharynx is clear and moist.  Eyes: Conjunctivae are normal.  Neck: Neck supple. No thyromegaly present.  Cardiovascular: Normal rate, regular rhythm, normal heart sounds and intact distal pulses.   Pulmonary/Chest: Effort normal and breath sounds normal. No respiratory distress. She has no wheezes. She has no rales.  Musculoskeletal: She exhibits no edema.  Lymphadenopathy:    She has no cervical adenopathy.  Neurological: She is alert and oriented to person, place, and time.          Assessment & Plan:  For the HTN we will decrease the Amlodipine to 5 mg (or 1/2 a tablet) daily to reduce the ankle swelling. Stay on HCTZ. Add Metoprolol succinate 50 mg daily. Recheck in 3 weeks. For the allergies we will give her a steroid shot. Alysia Penna, MD

## 2017-04-05 NOTE — Patient Instructions (Signed)
WE NOW OFFER    [facility] FAST TRACK!!!  SAME DAY Appointments for ACUTE CARE  Such as: Sprains, Injuries, cuts, abrasions, rashes, muscle pain, joint pain, back pain Colds, flu, sore throats, headache, allergies, cough, fever  Ear pain, sinus and eye infections Abdominal pain, nausea, vomiting, diarrhea, upset stomach Animal/insect bites  3 Easy Ways to Schedule: Walk-In Scheduling Call in scheduling Mychart Sign-up: [email or web address]         

## 2017-04-05 NOTE — Addendum Note (Signed)
Addended by: Aggie Hacker A on: 04/05/2017 02:41 PM   Modules accepted: Orders

## 2017-04-13 ENCOUNTER — Ambulatory Visit (INDEPENDENT_AMBULATORY_CARE_PROVIDER_SITE_OTHER): Payer: BLUE CROSS/BLUE SHIELD | Admitting: Family Medicine

## 2017-04-13 ENCOUNTER — Ambulatory Visit: Payer: BLUE CROSS/BLUE SHIELD | Admitting: Family Medicine

## 2017-04-13 ENCOUNTER — Encounter: Payer: Self-pay | Admitting: Family Medicine

## 2017-04-13 VITALS — BP 112/82 | HR 89 | Temp 98.3°F | Ht 66.0 in | Wt 183.0 lb

## 2017-04-13 DIAGNOSIS — I10 Essential (primary) hypertension: Secondary | ICD-10-CM | POA: Diagnosis not present

## 2017-04-13 DIAGNOSIS — R739 Hyperglycemia, unspecified: Secondary | ICD-10-CM | POA: Diagnosis not present

## 2017-04-13 NOTE — Progress Notes (Signed)
   Subjective:    Patient ID: DEVINE DANT, female    DOB: 1976-03-03, 41 y.o.   MRN: [id number]  HPI Here to follow up on HTN. She was here 8 days ago when her BP was elevated and she had some ankle swelling. We decreased her Amlodipine to 5 mg a day and added Metoprolol succinate 50 mg daily. Since then she has felt worse with headaches, dizziness, and weakness. The ankle swelling resolved. No chest pain or SOB.    Review of Systems  Constitutional: Positive for fatigue.  Eyes: Negative.   Respiratory: Negative.   Cardiovascular: Negative.   Gastrointestinal: Negative.   Neurological: Positive for dizziness and weakness.       Objective:   Physical Exam  Constitutional: She is oriented to person, place, and time.  Alert but she appears ill   Neck: Neck supple. No thyromegaly present.  Cardiovascular: Normal rate, regular rhythm, normal heart sounds and intact distal pulses.   Pulmonary/Chest: Effort normal and breath sounds normal. No respiratory distress. She has no wheezes. She has no rales.  Musculoskeletal: She exhibits no edema.  Lymphadenopathy:    She has no cervical adenopathy.  Neurological: She is alert and oriented to person, place, and time.          Assessment & Plan:  Her HTN is now controlled but she seems to have an intolerance to Metoprolol. We will stop this and she will stay on HCTZ and 1/2 tab Amlodipine daily. Get labs today. Due to the difficulty getting her on a regimen she is comfortable with and a strong family hx of heart disease , we will also refer her to Cardiology. Alysia Penna, MD

## 2017-04-13 NOTE — Patient Instructions (Signed)
WE NOW OFFER   [facility] FAST TRACK!!!  SAME DAY Appointments for ACUTE CARE  Such as: Sprains, Injuries, cuts, abrasions, rashes, muscle pain, joint pain, back pain Colds, flu, sore throats, headache, allergies, cough, fever  Ear pain, sinus and eye infections Abdominal pain, nausea, vomiting, diarrhea, upset stomach Animal/insect bites  3 Easy Ways to Schedule: Walk-In Scheduling Call in scheduling Mychart Sign-up: [email or web address]         

## 2017-04-14 LAB — CBC WITH DIFFERENTIAL/PLATELET
BASOS PCT: 1.4 % (ref 0.0–3.0)
Basophils Absolute: 0.1 10*3/uL (ref 0.0–0.1)
EOS PCT: 1.7 % (ref 0.0–5.0)
Eosinophils Absolute: 0.2 10*3/uL (ref 0.0–0.7)
HEMATOCRIT: 40 % (ref 36.0–46.0)
HEMOGLOBIN: 13.1 g/dL (ref 12.0–15.0)
LYMPHS PCT: 33.4 % (ref 12.0–46.0)
Lymphs Abs: 3 10*3/uL (ref 0.7–4.0)
MCHC: 32.7 g/dL (ref 30.0–36.0)
MCV: 81.4 fl (ref 78.0–100.0)
MONOS PCT: 6.2 % (ref 3.0–12.0)
Monocytes Absolute: 0.6 10*3/uL (ref 0.1–1.0)
NEUTROS ABS: 5.2 10*3/uL (ref 1.4–7.7)
Neutrophils Relative %: 57.3 % (ref 43.0–77.0)
PLATELETS: 400 10*3/uL (ref 150.0–400.0)
RBC: 4.92 Mil/uL (ref 3.87–5.11)
RDW: 15.8 % — ABNORMAL HIGH (ref 11.5–15.5)
WBC: 9.1 10*3/uL (ref 4.0–10.5)

## 2017-04-14 LAB — BASIC METABOLIC PANEL
BUN: 16 mg/dL (ref 6–23)
CHLORIDE: 99 meq/L (ref 96–112)
CO2: 29 meq/L (ref 19–32)
Calcium: 9.8 mg/dL (ref 8.4–10.5)
Creatinine, Ser: 0.93 mg/dL (ref 0.40–1.20)
GFR: 84.8 mL/min (ref >60.00)
GLUCOSE: 126 mg/dL — AB (ref 70–99)
POTASSIUM: 3.6 meq/L (ref 3.5–5.1)
SODIUM: 137 meq/L (ref 135–145)

## 2017-04-14 LAB — HEMOGLOBIN A1C: Hgb A1c MFr Bld: 5.9 % (ref 4.6–6.5)

## 2017-04-14 LAB — TSH: TSH: 1.82 u[IU]/mL (ref 0.35–4.50)

## 2017-04-23 ENCOUNTER — Encounter: Payer: Self-pay | Admitting: Family Medicine

## 2017-04-23 ENCOUNTER — Ambulatory Visit (INDEPENDENT_AMBULATORY_CARE_PROVIDER_SITE_OTHER): Payer: BLUE CROSS/BLUE SHIELD | Admitting: Family Medicine

## 2017-04-23 VITALS — BP 120/88 | HR 100 | Temp 98.6°F | Wt 183.4 lb

## 2017-04-23 DIAGNOSIS — J309 Allergic rhinitis, unspecified: Secondary | ICD-10-CM | POA: Diagnosis not present

## 2017-04-23 MED ORDER — METHYLPREDNISOLONE ACETATE 80 MG/ML IJ SUSP
80.0000 mg | Freq: Once | INTRAMUSCULAR | Status: AC
  Administered 2017-04-23: 80 mg via INTRAMUSCULAR

## 2017-04-23 MED ORDER — TRIAMCINOLONE ACETONIDE 0.1 % EX CREA: 1.0000 "application " | TOPICAL_CREAM | Freq: Two times a day (BID) | CUTANEOUS | 3 refills | Status: DC | PRN

## 2017-04-23 NOTE — Patient Instructions (Signed)
Allergic Rhinitis Allergic rhinitis is when the mucous membranes in the nose respond to allergens. Allergens are particles in the air that cause your body to have an allergic reaction. This causes you to release allergic antibodies. Through a chain of events, these eventually cause you to release histamine into the blood stream. Although meant to protect the body, it is this release of histamine that causes your discomfort, such as frequent sneezing, congestion, and an itchy, runny nose. What are the causes? Seasonal allergic rhinitis (hay fever) is caused by pollen allergens that may come from grasses, trees, and weeds. Year-round allergic rhinitis (perennial allergic rhinitis) is caused by allergens such as house dust mites, pet dander, and mold spores. What are the signs or symptoms?  Nasal stuffiness (congestion).  Itchy, runny nose with sneezing and tearing of the eyes. How is this diagnosed? Your health care provider can help you determine the allergen or allergens that trigger your symptoms. If you and your health care provider are unable to determine the allergen, skin or blood testing may be used. Your health care provider will diagnose your condition after taking your health history and performing a physical exam. Your health care provider may assess you for other related conditions, such as asthma, pink eye, or an ear infection. How is this treated? Allergic rhinitis does not have a cure, but it can be controlled by:  Medicines that block allergy symptoms. These may include allergy shots, nasal sprays, and oral antihistamines.  Avoiding the allergen. Hay fever may often be treated with antihistamines in pill or nasal spray forms. Antihistamines block the effects of histamine. There are over-the-counter medicines that may help with nasal congestion and swelling around the eyes. Check with your health care provider before taking or giving this medicine. If avoiding the allergen or the  medicine prescribed do not work, there are many new medicines your health care provider can prescribe. Stronger medicine may be used if initial measures are ineffective. Desensitizing injections can be used if medicine and avoidance does not work. Desensitization is when a patient is given ongoing shots until the body becomes less sensitive to the allergen. Make sure you follow up with your health care provider if problems continue. Follow these instructions at home: It is not possible to completely avoid allergens, but you can reduce your symptoms by taking steps to limit your exposure to them. It helps to know exactly what you are allergic to so that you can avoid your specific triggers. Contact a health care provider if:  You have a fever.  You develop a cough that does not stop easily (persistent).  You have shortness of breath.  You start wheezing.  Symptoms interfere with normal daily activities. This information is not intended to replace advice given to you by your health care provider. Make sure you discuss any questions you have with your health care provider. Document Released: 07/07/2001 Document Revised: 06/12/2016 Document Reviewed: 06/19/2013 Elsevier Interactive Patient Education  2017 [employer]  

## 2017-04-23 NOTE — Progress Notes (Signed)
Subjective:     Patient ID: Zoe Swanson, female   DOB: November 29, 1975, 41 y.o.   MRN: [id number]  HPI  Patient seen as a work candidate for "allergies ". She has a long history of allergic rhinitis as well as extrinsic asthma and was sounds like some atopic dermatitis as well. She is aware of especially allergies in the spring and summer with grass pollen and trees. At baseline, she takes Singulair, Flonase, and Benadryl.  She relates for the past few days some increased cough, pruritic rash especially on her upper extremities and neck and had as well as some nasal congestion. She has been tested for allergies and has allergies to mold, tree pollens, and grass. She has required systemic steroids for severe exacerbations in the past. Also requesting refill of topical steroid.  No recent fever. No dyspnea. No angioedema symptoms  Past Medical History:  Diagnosis Date  . Asthma    prn inhaler and neb.  Marland Kitchen Axillary mass 07/2014   left  . CRPS (complex regional pain syndrome), lower limb    left ankle  . Hypertension    under better control since adding HCTZ  . Migraine   . Multiple allergies    Past Surgical History:  Procedure Laterality Date  . ANKLE ARTHROTOMY Left 06/02/2011   removal of loose body; anterolateral ligament repair  . BREAST BIOPSY  2008  . BREAST LUMPECTOMY W/ NEEDLE LOCALIZATION Left 12/20/2006  . BREAST LUMPECTOMY WITH RADIOACTIVE SEED LOCALIZATION Left 08/24/2014   Procedure: LEFT BREAST LUMPECTOMY WITH RADIOACTIVE SEED LOCALIZATION;  Surgeon: Fanny Skates, MD;  Location: [facility];  Service: General;  Laterality: Left;  . CESAREAN SECTION  06/30/2009  . KNEE ARTHROSCOPY Left 1995  . SPINAL CORD STIMULATOR IMPLANT Right 07/20/2013   buttock  . TUBAL LIGATION      reports that she has never smoked. She has never used smokeless tobacco. She reports that she drinks alcohol. She reports that she does not use drugs. family history is not on  file. Allergies  Allergen Reactions  . Pollen Extract Hives and Itching  . Metoprolol     Headaches and dizziness  . Mold Extract [Trichophyton]   . Percocet [Oxycodone-Acetaminophen]     itching  . Lisinopril Cough    Review of Systems  Constitutional: Negative for chills and fever.  HENT: Positive for congestion.   Respiratory: Negative for shortness of breath.   Cardiovascular: Negative for chest pain.  Skin: Positive for rash.       Objective:   Physical Exam  Constitutional: She appears well-developed and well-nourished.  HENT:  Right Ear: External ear normal.  Left Ear: External ear normal.  Mouth/Throat: Oropharynx is clear and moist.  Cardiovascular: Normal rate and regular rhythm.   Pulmonary/Chest: Effort normal and breath sounds normal. No respiratory distress. She has no wheezes. She has no rales.  Skin:  She has some nonspecific mild excoriations along her lateral elbow region bilaterally       Assessment:     Allergic rhinitis-seasonal and perennial    Plan:     -Depo-Medrol 80 mg IM given -Refill triamcinolone 0.1% cream twice daily as needed -Continue her usual daily allergy medications  Eulas Post MD [facility]

## 2017-04-30 ENCOUNTER — Other Ambulatory Visit: Payer: Self-pay | Admitting: Family Medicine

## 2017-04-30 NOTE — Telephone Encounter (Signed)
Rx has been called in as directed.  

## 2017-04-30 NOTE — Telephone Encounter (Signed)
Call in #[phone] with 5 rf 

## 2017-05-04 ENCOUNTER — Ambulatory Visit (INDEPENDENT_AMBULATORY_CARE_PROVIDER_SITE_OTHER): Payer: BLUE CROSS/BLUE SHIELD | Admitting: Physician Assistant

## 2017-05-04 ENCOUNTER — Encounter: Payer: Self-pay | Admitting: Physician Assistant

## 2017-05-04 VITALS — BP 124/86 | HR 87 | Ht 65.5 in | Wt 179.0 lb

## 2017-05-04 DIAGNOSIS — G90523 Complex regional pain syndrome I of lower limb, bilateral: Secondary | ICD-10-CM | POA: Diagnosis not present

## 2017-05-04 DIAGNOSIS — I517 Cardiomegaly: Secondary | ICD-10-CM

## 2017-05-04 DIAGNOSIS — R079 Chest pain, unspecified: Secondary | ICD-10-CM | POA: Diagnosis not present

## 2017-05-04 DIAGNOSIS — R42 Dizziness and giddiness: Secondary | ICD-10-CM

## 2017-05-04 DIAGNOSIS — R002 Palpitations: Secondary | ICD-10-CM

## 2017-05-04 DIAGNOSIS — I10 Essential (primary) hypertension: Secondary | ICD-10-CM

## 2017-05-04 MED ORDER — MECLIZINE HCL 25 MG PO TABS: 25.0000 mg | ORAL_TABLET | Freq: Every day | ORAL | 3 refills | Status: DC | PRN

## 2017-05-04 MED ORDER — DILTIAZEM HCL 30 MG PO TABS: 30.0000 mg | ORAL_TABLET | Freq: Three times a day (TID) | ORAL | 3 refills | Status: DC

## 2017-05-04 NOTE — Patient Instructions (Signed)
Medication Instructions:   STOP amlodipine START Diltiazem 30mg  every 8 hours  START Meclizine 25mg  once daily as needed for dizziness.  Labwork:   none  Testing/Procedures:  Your physician has requested that you have an echocardiogram. Echocardiography is a painless test that uses sound waves to create images of your heart. It provides your doctor with information about the size and shape of your heart and how well your heart's chambers and valves are working. This procedure takes approximately one hour. There are no restrictions for this procedure.  Your physician has requested that you have an exercise tolerance test. For further information please visit [email or web address]. Please also follow instruction sheet, as given.    Follow-Up:  3-4 months w/ Dr. Claiborne Billings   If you need a refill on your cardiac medications before your next appointment, please call your pharmacy.

## 2017-05-04 NOTE — Progress Notes (Signed)
Cardiology Office Note    Date:  05/04/2017   ID:  Zoe Swanson, DOB 1975/12/25, MRN [id number]  PCP:  Laurey Morale, MD  Cardiologist:  Case discussed with Dr. Claiborne Billings DOD  Chief Complaint  Patient presents with  . New Patient (Initial Visit)    referred for evaluation of cardiovascular risk factors. Case discussed with Dr. Claiborne Billings  . Dizziness  . Chest Pain  . Leg Pain  . Palpitations   Zoe Swanson was referred to cardiology service at the request of Dr. Sarajane Jews for evaluation of cardiovascular risk factor  History of Present Illness:  Zoe Swanson is a 41 y.o. female with PMH of HTN and complex regional pain syndrome in bilateral lower extremities. She was referred by her primary care physician to cardiology service for risk factor evaluation and also management of blood pressure. She has multiple complaints today including chronic dizziness. She feels like her room is spinning and sometimes feels she is going to pass out. The symptom sounds like vertigo, I will do a trial of meclizine in this case. She also have chronic bilateral lower extremity pain, given her age, very unlikely to have significant peripheral arterial disease. She does have diminished pulses in the bilateral lower extremity, I wish to observe her symptom for now. EKG today showed LVH. I recommended an echocardiogram. She also complaining of chronic bilateral lower extremity edema, I did not see any significant swelling on my physical exam. She complained about chest pain, feeling of coldness especially in the legs and also palpitation as well. Her recent TSH and hemoglobin is normal. I do not see significant swelling in the lower extremity, however I recommend discontinuing amlodipine and switch to short-acting Cardizem to see if that will help with her palpitations. I think her palpitation is likely driven by pain. She does have snoring problem, however denies any signs of apnea. I will hold off on sleep study at this  time. As far as her chest discomfort, it is unclear to me if this is related to cardiac issue, it does not have a clear exertional component. I will order a plain old treadmill test at this time for further evaluation. The case has been discussed with Dr. Claiborne Billings will also was agreeable. This is a complicated patient with multiple complaints.   Past Medical History:  Diagnosis Date  . Asthma    prn inhaler and neb.  Marland Kitchen Axillary mass 07/2014   left  . CRPS (complex regional pain syndrome), lower limb    left ankle  . Hypertension    under better control since adding HCTZ  . Migraine   . Multiple allergies     Past Surgical History:  Procedure Laterality Date  . ANKLE ARTHROTOMY Left 06/02/2011   removal of loose body; anterolateral ligament repair  . BREAST BIOPSY  2008  . BREAST LUMPECTOMY W/ NEEDLE LOCALIZATION Left 12/20/2006  . BREAST LUMPECTOMY WITH RADIOACTIVE SEED LOCALIZATION Left 08/24/2014   Procedure: LEFT BREAST LUMPECTOMY WITH RADIOACTIVE SEED LOCALIZATION;  Surgeon: Fanny Skates, MD;  Location: [facility];  Service: General;  Laterality: Left;  . CESAREAN SECTION  06/30/2009  . KNEE ARTHROSCOPY Left 1995  . SPINAL CORD STIMULATOR IMPLANT Right 07/20/2013   buttock  . TUBAL LIGATION      Current Medications: Outpatient Medications Prior to Visit  Medication Sig Dispense Refill  . albuterol (PROVENTIL) (2.5 MG/3ML) 0.083% nebulizer solution Take 2.5 mg by nebulization every 4 (four) hours as needed for wheezing  or shortness of breath (asthma).     . albuterol (VENTOLIN HFA) 108 (90 BASE) MCG/ACT inhaler Inhale 2 puffs into the lungs every 6 (six) hours as needed for wheezing or shortness of breath (ashtma).     . ALPRAZolam (XANAX) 1 MG tablet TAKE 1 TABLET BY MOUTH TWICE DAILY AS NEEDED 60 tablet 5  . cyclobenzaprine (FLEXERIL) 10 MG tablet TAKE 1 TABLET BY MOUTH THREE TIMES DAILY AS NEEDED FOR MUSCLE SPASMS. 60 tablet 1  . diphenhydrAMINE (BENADRYL) 25 MG  tablet Take 25 mg by mouth every 6 (six) hours as needed for allergies (allergic reaction).    Marland Kitchen EPINEPHrine 0.3 mg/0.3 mL IJ SOAJ injection Inject 0.3 mg into the muscle once. Reported on 01/24/2016    . hydrochlorothiazide (HYDRODIURIL) 25 MG tablet TAKE 1 TABLET BY MOUTH DAILY 30 tablet 6  . triamcinolone cream (KENALOG) 0.1 % Apply 1 application topically 2 (two) times daily as needed. 30 g 3  . zolpidem (AMBIEN) 10 MG tablet TAKE ONE TABLET BY MOUTH EVERY NIGHT AT BEDTIME AS NEEDED 30 tablet 5  . amLODipine (NORVASC) 10 MG tablet Take 0.5 tablets (5 mg total) by mouth daily. 90 tablet 3   No facility-administered medications prior to visit.      Allergies:   Pollen extract; Metoprolol; Mold extract [trichophyton]; Percocet [oxycodone-acetaminophen]; and Lisinopril   Social History   Social History  . Marital status: Married    Spouse name: N/A  . Number of children: N/A  . Years of education: N/A   Social History Main Topics  . Smoking status: Never Smoker  . Smokeless tobacco: Never Used  . Alcohol use 0.0 oz/week     Comment: occasionally  . Drug use: No  . Sexual activity: Not Asked   Other Topics Concern  . None   Social History Narrative  . None     Family History:  The patient's family history includes Diabetes Mellitus II in her mother; Hypertension in her father and mother.   ROS:   Please see the history of present illness.    ROS All other systems reviewed and are negative.   PHYSICAL EXAM:   VS:  BP 124/86   Pulse 87   Ht 5' 5.5" (1.664 m)   Wt 179 lb (81.2 kg)   LMP 04/21/2017 (Exact Date)   BMI 29.33 kg/m    GEN: Well nourished, well developed, in no acute distress  HEENT: normal  Neck: no JVD, carotid bruits, or masses Cardiac: RRR; no murmurs, rubs, or gallops,no edema  Respiratory:  clear to auscultation bilaterally, normal work of breathing GI: soft, nontender, nondistended, + BS MS: no deformity or atrophy  Skin: warm and dry, no  rash Neuro:  Alert and Oriented x 3, Strength and sensation are intact Psych: euthymic mood, full affect  Wt Readings from Last 3 Encounters:  05/04/17 179 lb (81.2 kg)  04/23/17 183 lb 6.4 oz (83.2 kg)  04/13/17 183 lb (83 kg)      Studies/Labs Reviewed:   EKG:  EKG is ordered today.  The ekg ordered today demonstrates Normal sinus rhythm with LVH  Recent Labs: 08/04/2016: ALT 16 04/13/2017: BUN 16; Creatinine, Ser 0.93; Hemoglobin 13.1; Platelets 400.0; Potassium 3.6; Sodium 137; TSH 1.82   Lipid Panel    Component Value Date/Time   CHOL 231 (H) 11/01/2013 0959   TRIG 76.0 11/01/2013 0959   HDL 62.10 11/01/2013 0959   CHOLHDL 4 11/01/2013 0959   VLDL 15.2 11/01/2013 0959  [place] 112 (H) 03/25/2012 1550   LDLDIRECT 150.4 11/01/2013 0959    Additional studies/ records that were reviewed today include:   Recent primary care office visit   ASSESSMENT:    1. Chest pain, unspecified type   2. Left ventricular hypertrophy by electrocardiogram   3. Dizziness   4. Complex regional pain syndrome type 1 of both lower extremities   5. Palpitation   6. Essential hypertension      PLAN:  In order of problems listed above:  1. Chest pain: Plan for GXT, somewhat atypical, does not necessarily associated with exertion.  2. Dizziness: Described the sensation of spinning of the room, sounds like vertigo, will do a trial of meclizine.  3. Palpitation: May or may not occur with chest pain, I will do a trial of short-acting diltiazem. Palpitation sounds like it is occurring more often when her pain is not controlled. Therefore it may be driven by pain, will hold off the heart monitor at this time.  4. Complex regional pain syndrome: Describes bilateral lower extremity pain, she has a neurostimulator implanted in her back.  5. LVH by EKG: Plan to obtain echocardiogram.   6. Hypertension: Currently blood pressure very well controlled on amlodipine 5 mg daily, I recommended  holding the amlodipine and started on short-acting diltiazem 30 mg every 8 hours.    Medication Adjustments/Labs and Tests Ordered: Current medicines are reviewed at length with the patient today.  Concerns regarding medicines are outlined above.  Medication changes, Labs and Tests ordered today are listed in the Patient Instructions below. Patient Instructions  Medication Instructions:   STOP amlodipine START Diltiazem 30mg  every 8 hours  START Meclizine 25mg  once daily as needed for dizziness.  Labwork:   none  Testing/Procedures:  Your physician has requested that you have an echocardiogram. Echocardiography is a painless test that uses sound waves to create images of your heart. It provides your doctor with information about the size and shape of your heart and how well your heart's chambers and valves are working. This procedure takes approximately one hour. There are no restrictions for this procedure.  Your physician has requested that you have an exercise tolerance test. For further information please visit [email or web address]. Please also follow instruction sheet, as given.    Follow-Up:  3-4 months w/ Dr. Claiborne Billings   If you need a refill on your cardiac medications before your next appointment, please call your pharmacy.      Hilbert Corrigan, [place]  05/04/2017 5:36 PM    [facility] [address], [place], [place]  [address] Phone: [phone]; Fax: [phone]

## 2017-05-05 ENCOUNTER — Telehealth: Payer: Self-pay | Admitting: Physician Assistant

## 2017-05-05 NOTE — Telephone Encounter (Signed)
Note created and placed at front desk.  Patient made aware.

## 2017-05-05 NOTE — Telephone Encounter (Signed)
[place]. But if ETT normal, she can resume work

## 2017-05-05 NOTE — Telephone Encounter (Signed)
New message     Pt would like a doctors note for her job , writing her out of work from 05/10/17 to 05/21/17

## 2017-05-11 DIAGNOSIS — Z1231 Encounter for screening mammogram for malignant neoplasm of breast: Secondary | ICD-10-CM | POA: Diagnosis not present

## 2017-05-11 DIAGNOSIS — Z01419 Encounter for gynecological examination (general) (routine) without abnormal findings: Secondary | ICD-10-CM | POA: Diagnosis not present

## 2017-05-11 DIAGNOSIS — Z6829 Body mass index (BMI) 29.0-29.9, adult: Secondary | ICD-10-CM | POA: Diagnosis not present

## 2017-05-13 ENCOUNTER — Ambulatory Visit (HOSPITAL_COMMUNITY): Payer: BLUE CROSS/BLUE SHIELD | Attending: Cardiology

## 2017-05-13 ENCOUNTER — Telehealth: Payer: Self-pay | Admitting: Physician Assistant

## 2017-05-13 ENCOUNTER — Other Ambulatory Visit: Payer: Self-pay

## 2017-05-13 DIAGNOSIS — I517 Cardiomegaly: Secondary | ICD-10-CM | POA: Insufficient documentation

## 2017-05-13 DIAGNOSIS — I10 Essential (primary) hypertension: Secondary | ICD-10-CM | POA: Insufficient documentation

## 2017-05-13 DIAGNOSIS — R42 Dizziness and giddiness: Secondary | ICD-10-CM | POA: Diagnosis not present

## 2017-05-13 DIAGNOSIS — R079 Chest pain, unspecified: Secondary | ICD-10-CM | POA: Insufficient documentation

## 2017-05-13 DIAGNOSIS — Z0389 Encounter for observation for other suspected diseases and conditions ruled out: Secondary | ICD-10-CM

## 2017-05-13 NOTE — Telephone Encounter (Signed)
New Message ° ° pt verbalized that she is returning call for rn °

## 2017-05-13 NOTE — Telephone Encounter (Signed)
I have called the patient and discussed echo result with patient and family, further recommendation once I discuss with Dr. Ellyn Hack.

## 2017-05-14 ENCOUNTER — Other Ambulatory Visit: Payer: Self-pay | Admitting: Physician Assistant

## 2017-05-14 DIAGNOSIS — Z0389 Encounter for observation for other suspected diseases and conditions ruled out: Secondary | ICD-10-CM | POA: Diagnosis not present

## 2017-05-14 NOTE — Telephone Encounter (Signed)
-----   Message from [place], [place] sent at 05/13/2017  5:55 PM EDT ----- I have called the patient, discussed the case with Dr. Ellyn Hack and reviewed the image with Dr. Oval Linsey, our cardiology imager today. Our plan is:  1. She will need to come to lab for 2 sets of blood cultures, preferably this Friday 2. I will need to see her back next week, 7/25 at 3:30PM (double book) to discuss potential TEE

## 2017-05-14 NOTE — Telephone Encounter (Signed)
Blood cultures ordered for [facility] collect. Per discussion w Rickard Patience, these would need to be collected at a regular [facility] site. I called patient and instructed her to go to the [facility] on [address] ([place]). She will get these done today. I've asked her to return for appt on Wednesday 7/25 at 3:30pm, per Hao's encouragement - patient has agreed to this.

## 2017-05-15 ENCOUNTER — Emergency Department (HOSPITAL_COMMUNITY)
Admission: EM | Admit: 2017-05-15 | Discharge: 2017-05-15 | Disposition: A | Discharge status: Home / Self Care | Payer: BLUE CROSS/BLUE SHIELD | Attending: Emergency Medicine | Admitting: Emergency Medicine

## 2017-05-15 ENCOUNTER — Emergency Department (HOSPITAL_COMMUNITY): Payer: BLUE CROSS/BLUE SHIELD

## 2017-05-15 ENCOUNTER — Encounter (HOSPITAL_COMMUNITY): Payer: Self-pay

## 2017-05-15 DIAGNOSIS — R0789 Other chest pain: Secondary | ICD-10-CM | POA: Diagnosis not present

## 2017-05-15 DIAGNOSIS — R079 Chest pain, unspecified: Secondary | ICD-10-CM

## 2017-05-15 DIAGNOSIS — Z79899 Other long term (current) drug therapy: Secondary | ICD-10-CM | POA: Insufficient documentation

## 2017-05-15 DIAGNOSIS — R5383 Other fatigue: Secondary | ICD-10-CM | POA: Diagnosis not present

## 2017-05-15 DIAGNOSIS — R0602 Shortness of breath: Secondary | ICD-10-CM | POA: Diagnosis not present

## 2017-05-15 DIAGNOSIS — J45909 Unspecified asthma, uncomplicated: Secondary | ICD-10-CM | POA: Diagnosis not present

## 2017-05-15 DIAGNOSIS — I10 Essential (primary) hypertension: Secondary | ICD-10-CM | POA: Insufficient documentation

## 2017-05-15 LAB — CBC
HEMATOCRIT: 40.7 % (ref 36.0–46.0)
HEMOGLOBIN: 13.3 g/dL (ref 12.0–15.0)
MCH: 26.5 pg (ref 26.0–34.0)
MCHC: 32.7 g/dL (ref 30.0–36.0)
MCV: 81.2 fL (ref 78.0–100.0)
Platelets: 425 10*3/uL — ABNORMAL HIGH (ref 150–400)
RBC: 5.01 MIL/uL (ref 3.87–5.11)
RDW: 15.3 % (ref 11.5–15.5)
WBC: 8.7 10*3/uL (ref 4.0–10.5)

## 2017-05-15 LAB — BASIC METABOLIC PANEL
ANION GAP: 8 (ref 5–15)
BUN: 15 mg/dL (ref 6–20)
CHLORIDE: 101 mmol/L (ref 101–111)
CO2: 26 mmol/L (ref 22–32)
Calcium: 9.5 mg/dL (ref 8.9–10.3)
Creatinine, Ser: 0.86 mg/dL (ref 0.44–1.00)
GFR calc Af Amer: >60 mL/min (ref >60)
GLUCOSE: 82 mg/dL (ref 65–99)
POTASSIUM: 3.5 mmol/L (ref 3.5–5.1)
Sodium: 135 mmol/L (ref 135–145)

## 2017-05-15 LAB — I-STAT TROPONIN, ED: Troponin i, poc: 0.02 ng/mL (ref 0.00–0.08)

## 2017-05-15 LAB — POC URINE PREG, ED: Preg Test, Ur: NEGATIVE

## 2017-05-15 MED ORDER — IOPAMIDOL (ISOVUE-370) INJECTION 76%
INTRAVENOUS | Status: AC
  Administered 2017-05-15: 100 mL via INTRAVENOUS
  Filled 2017-05-15: qty 100

## 2017-05-15 NOTE — ED Notes (Signed)
ED Provider at bedside. 

## 2017-05-15 NOTE — ED Provider Notes (Signed)
[facility] DEPT Provider Note   CSN: [id number] Arrival date & time: 05/15/17  1300     History   Chief Complaint No chief complaint on file.   HPI Zoe Swanson is a 41 y.o. female.  Patient presents with complaint of chest pain and fatigue for several days. This is worsening. Patient was initially seen in mid June by her PCP with problems related to her blood pressure. She was having some weakness and dizziness at that time. She was referred to cardiology due to a family history of heart disease. She was scheduled for an echocardiogram which she had performed which showed abnormalities including possible septal mass and tricuspid/mitral valve vegetation. She had a exercise stress test ordered which was placed on hold after these findings. She is scheduled a follow-up on 7/25 with her cardiology PA. Patient had blood cultures drawn yesterday which are pending. She presents today with continued symptoms and a sharp stabbing mid chest pain that is intermittent and non-radiating. Also with profound fatigue. She reports that she is a Engineer, mining and has severely decreased exercise tolerance over the past several weeks and is currently unable to exercise.  Patient denies IV drug use or history of boils. She has an implantable spinal stimulator which was relocated in the past year. She has also had a lumpectomy for fibrous breast mass. Previous spine injections but none recently. She denies history of UTI. No recent fevers or weight loss.      Past Medical History:  Diagnosis Date  . Asthma    prn inhaler and neb.  Marland Kitchen Axillary mass 07/2014   left  . CRPS (complex regional pain syndrome), lower limb    left ankle  . Hypertension    under better control since adding HCTZ  . Migraine   . Multiple allergies     Patient Active Problem List   Diagnosis Date Noted  . Left breast mass 08/24/2014  . HTN (hypertension) 06/18/2014  . GENERALIZED ANXIETY DISORDER 03/23/2008  . Allergic  rhinitis 03/15/2008  . ASTHMA, EXTRINSIC W/ACUTE EXACERBATION 03/14/2008  . VIRAL URI 01/03/2008  . ANKLE SPRAIN 11/17/2007  . ASTHMA 08/23/2007    Past Surgical History:  Procedure Laterality Date  . ANKLE ARTHROTOMY Left 06/02/2011   removal of loose body; anterolateral ligament repair  . BREAST BIOPSY  2008  . BREAST LUMPECTOMY W/ NEEDLE LOCALIZATION Left 12/20/2006  . BREAST LUMPECTOMY WITH RADIOACTIVE SEED LOCALIZATION Left 08/24/2014   Procedure: LEFT BREAST LUMPECTOMY WITH RADIOACTIVE SEED LOCALIZATION;  Surgeon: Fanny Skates, MD;  Location: [facility];  Service: General;  Laterality: Left;  . CESAREAN SECTION  06/30/2009  . KNEE ARTHROSCOPY Left 1995  . SPINAL CORD STIMULATOR IMPLANT Right 07/20/2013   buttock  . TUBAL LIGATION      OB History    No data available       Home Medications    Prior to Admission medications   Medication Sig Start Date End Date Taking? Authorizing Provider  albuterol (PROVENTIL) (2.5 MG/3ML) 0.083% nebulizer solution Take 2.5 mg by nebulization every 4 (four) hours as needed for wheezing or shortness of breath (asthma).     [provider]  albuterol (VENTOLIN HFA) 108 (90 BASE) MCG/ACT inhaler Inhale 2 puffs into the lungs every 6 (six) hours as needed for wheezing or shortness of breath (ashtma).     [provider]  ALPRAZolam Duanne Moron) 1 MG tablet TAKE 1 TABLET BY MOUTH TWICE DAILY AS NEEDED 12/01/16   Alysia Penna  A, MD  cyclobenzaprine (FLEXERIL) 10 MG tablet TAKE 1 TABLET BY MOUTH THREE TIMES DAILY AS NEEDED FOR MUSCLE SPASMS. 02/24/17   Laurey Morale, MD  diltiazem (CARDIZEM) 30 MG tablet Take 1 tablet (30 mg total) by mouth 3 (three) times daily. 05/04/17   Almyra Deforest, PA  diphenhydrAMINE (BENADRYL) 25 MG tablet Take 25 mg by mouth every 6 (six) hours as needed for allergies (allergic reaction).    [provider]  EPINEPHrine 0.3 mg/0.3 mL IJ SOAJ injection Inject 0.3 mg into the muscle once. Reported  on 01/24/2016 02/03/14   Elnora Morrison, MD  hydrochlorothiazide (HYDRODIURIL) 25 MG tablet TAKE 1 TABLET BY MOUTH DAILY 03/29/17   Laurey Morale, MD  meclizine (ANTIVERT) 25 MG tablet Take 1 tablet (25 mg total) by mouth daily as needed for dizziness. 05/04/17   Almyra Deforest, PA  triamcinolone cream (KENALOG) 0.1 % Apply 1 application topically 2 (two) times daily as needed. 04/23/17   Burchette, Alinda Sierras, MD  zolpidem (AMBIEN) 10 MG tablet TAKE ONE TABLET BY MOUTH EVERY NIGHT AT BEDTIME AS NEEDED 04/30/17   Laurey Morale, MD    Family History Family History  Problem Relation Age of Onset  . Hypertension Mother   . Diabetes Mellitus II Mother   . Hypertension Father     Social History Social History  Substance Use Topics  . Smoking status: Never Smoker  . Smokeless tobacco: Never Used  . Alcohol use 0.0 oz/week     Comment: occasionally     Allergies   Pollen extract; Metoprolol; Mold extract [trichophyton]; Percocet [oxycodone-acetaminophen]; and Lisinopril   Review of Systems Review of Systems  Constitutional: Negative for fever.  HENT: Negative for rhinorrhea and sore throat.   Eyes: Negative for redness.  Respiratory: Positive for cough (Worsening over the past day), chest tightness and shortness of breath.   Cardiovascular: Negative for chest pain.  Gastrointestinal: Negative for abdominal pain, diarrhea, nausea and vomiting.  Genitourinary: Negative for dysuria.  Musculoskeletal: Negative for myalgias.  Skin: Negative for rash.  Neurological: Negative for headaches.     Physical Exam Updated Vital Signs BP (!) 142/90 (BP Location: Right Arm)   Pulse 85   Temp 98.3 F (36.8 C) (Oral)   Resp 20   LMP 04/21/2017 (Exact Date)   SpO2 100%   Physical Exam  Constitutional: She appears well-developed and well-nourished.  HENT:  Head: Normocephalic and atraumatic.  Mouth/Throat: Oropharynx is clear and moist and mucous membranes are normal. Mucous membranes are not dry.    Eyes: Conjunctivae are normal.  Neck: Trachea normal and normal range of motion. Neck supple. Normal carotid pulses and no JVD present. No muscular tenderness present. Carotid bruit is not present. No tracheal deviation present.  Cardiovascular: Normal rate, regular rhythm, S1 normal, S2 normal, normal heart sounds and intact distal pulses.  Exam reveals no decreased pulses.   No murmur heard. No murmur at time of exam.   Pulmonary/Chest: Effort normal. No respiratory distress. She has no wheezes. She exhibits no tenderness.  Abdominal: Soft. Normal aorta and bowel sounds are normal. There is no tenderness. There is no rebound and no guarding.  Musculoskeletal: Normal range of motion.  Neurological: She is alert.  Skin: Skin is warm and dry. She is not diaphoretic. No cyanosis. No pallor.  No splinter hemorrhages, Osler nodes, Janeway lesions noted.   Psychiatric: She has a normal mood and affect.  Nursing note and vitals reviewed.    ED Treatments /  Results  Labs (all labs ordered are listed, but only abnormal results are displayed) Labs Reviewed  CBC - Abnormal; Notable for the following:       Result Value   Platelets 425 (*)    All other components within normal limits  BASIC METABOLIC PANEL  I-STAT TROPONIN, ED  POC URINE PREG, ED    ED ECG REPORT   Date: 05/15/2017  Rate: 91  Rhythm: normal sinus rhythm  QRS Axis: normal  Intervals: normal  ST/T Wave abnormalities: normal  Conduction Disutrbances:none  Narrative Interpretation: inferolateral q-waves noted  Old EKG Reviewed: unchanged  I have personally reviewed the EKG tracing and agree with the computerized printout as noted.  Radiology Ct Angio Chest Pe W And/or Wo Contrast  Result Date: 05/15/2017 CLINICAL DATA:  Left chest pain, shortness of breath CT evaluate for PE EXAM: CT ANGIOGRAPHY CHEST WITH CONTRAST TECHNIQUE: Multidetector CT imaging of the chest was performed using the standard protocol during bolus  administration of intravenous contrast. Multiplanar CT image reconstructions and MIPs were obtained to evaluate the vascular anatomy. CONTRAST:  100 mL Isovue 370 IV COMPARISON:  Chest radiographs dated 02/26/2014 FINDINGS: Cardiovascular: Satisfactory opacification of the bilateral pulmonary artery to the segmental level. No evidence of pulmonary embolism. No evidence of thoracic aortic aneurysm or dissection. The heart is normal in size.  No pericardial effusion. Mediastinum/Nodes: No suspicious mediastinal lymphadenopathy. Visualized thyroid is unremarkable. Lungs/Pleura: Lungs are clear. No suspicious pulmonary nodules. Minimal dependent atelectasis in the bilateral lower lobes. No focal consolidation. No pleural effusion or pneumothorax. Upper Abdomen: Visualized upper abdomen is unremarkable. Musculoskeletal: Visualized osseous structures are within normal limits. Thoracic spine stimulator. Review of the MIP images confirms the above findings. IMPRESSION: No evidence of pulmonary embolism. Normal CT chest. Electronically Signed   By: Julian Hy M.D.   On: 05/15/2017 15:59    Procedures Procedures (including critical care time)  Medications Ordered in ED Medications  iopamidol (ISOVUE-370) 76 % injection (100 mLs Intravenous Contrast Given 05/15/17 1529)     Initial Impression / Assessment and Plan / ED Course  I have reviewed the triage vital signs and the nursing notes.  Pertinent labs & imaging results that were available during my care of the patient were reviewed by me and considered in my medical decision making (see chart for details).     Patient seen and examined. Discussed case with Dr. Tamera Punt. Work-up initiated. Will eval for PE/septic emboli given worsening SOB, possible vegetation.   Vital signs reviewed and are as follows: BP (!) 142/90 (BP Location: Right Arm)   Pulse 85   Temp 98.3 F (36.8 C) (Oral)   Resp 20   LMP 04/21/2017 (Exact Date)   SpO2 100%    4:25  PM patient updated on results. Symptoms currently are controlled. She has been up to the restroom without distress.  At this point, no indication for admission. I agree with current plan for follow-up with her cardiologist in 4 days. Discussed with patient that they may recommend transesophageal echocardiogram for further evaluation at that time.  She voices concern that some of her symptoms may be due to her calcium channel blocker. I discussed that I do not want to change her medications at this time and would have her discuss this with her cardiologist. She has had no bradycardia or hypotension today.  Will discharge home at this time. Patient was counseled to return with severe chest pain, especially if the pain is crushing or pressure-like and spreads  to the arms, back, neck, or jaw, or if they have sweating, nausea, or shortness of breath with the pain.    Final Clinical Impressions(s) / ED Diagnoses   Final diagnoses:  Chest pain, unspecified type  Fatigue, unspecified type   Patient with hypertension, intermittent chest pains, recent abnormal echocardiogram as documented above. She has had intermittent sharp stabbing chest pains with occasional shortness of breath that is exertional and also profound fatigue. Evaluation undertaken today to rule out any septic emboli or other pulmonary pathology. Fortunately this was negative. Her lab work was reassuring. EKG non-ischemic and troponin neg. She has had recent blood cultures which are pending. She has follow-up in 4 days with her cardiologist. I do not see any indication for admission at this time. She has normal vital signs with a hypoxia. No indications for emergent cardiology consultation. I reviewed current plan and agree. Return instructions discussed in detail with patient at bedside as above.  No dangerous or life-threatening conditions suspected or identified by history, physical exam, and by work-up. No indications for hospitalization  identified.    New Prescriptions New Prescriptions   No medications on file     Carlisle Cater, Hershal Coria 05/15/17 1629    Malvin Johns, MD 05/17/17 1044

## 2017-05-15 NOTE — Discharge Instructions (Signed)
Please read and follow all provided instructions.  Your diagnoses today include:  1. Chest pain, unspecified type   2. Fatigue, unspecified type     Tests performed today include:  An EKG of your heart  A chest CT - does not show any clots, heart failure, pneumonia or other problems  Cardiac enzymes - a blood test for heart muscle damage  Blood counts and electrolytes - no anemia or other problems  Vital signs. See below for your results today.   Medications prescribed:   None  Take any prescribed medications only as directed.  Follow-up instructions: Please follow-up with your Cardiologist as planned for further evaluation.   Return instructions:  SEEK IMMEDIATE MEDICAL ATTENTION IF:  You have severe chest pain, especially if the pain is crushing or pressure-like and spreads to the arms, back, neck, or jaw, or if you have sweating, nausea (feeling sick to your stomach), or shortness of breath. THIS IS AN EMERGENCY. Don't wait to see if the pain will go away. Get medical help at once. Call 911 or 0 (operator). DO NOT drive yourself to the hospital.   Your chest pain gets worse and does not go away with rest.   You have an attack of chest pain lasting longer than usual, despite rest and treatment with the medications your caregiver has prescribed.   You wake from sleep with chest pain or shortness of breath.  You feel dizzy or faint.  You have chest pain not typical of your usual pain for which you originally saw your caregiver.   You have any other emergent concerns regarding your health.  Additional Information: Chest pain comes from many different causes. Your caregiver has diagnosed you as having chest pain that is not specific for one problem, but does not require admission.  You are at low risk for an acute heart condition or other serious illness.   Your vital signs today were: BP 130/84    Pulse 83    Temp 98.3 F (36.8 C) (Oral)    Resp 12    LMP 04/21/2017  (Exact Date)    SpO2 100%  If your blood pressure (BP) was elevated above 135/85 this visit, please have this repeated by your doctor within one month. --------------

## 2017-05-15 NOTE — ED Notes (Signed)
Pt ambulated too restroom and back with no difficulties.

## 2017-05-15 NOTE — ED Triage Notes (Signed)
Patient complains of several days of chest tightness and weakness. Has had recent change in BP medicine and also was prescribed meclizine for dizziness. On arrival alert and oriented, NAD

## 2017-05-15 NOTE — ED Notes (Signed)
Patient transported to CT 

## 2017-05-19 ENCOUNTER — Telehealth (HOSPITAL_COMMUNITY): Payer: Self-pay

## 2017-05-19 ENCOUNTER — Ambulatory Visit (INDEPENDENT_AMBULATORY_CARE_PROVIDER_SITE_OTHER): Payer: BLUE CROSS/BLUE SHIELD | Admitting: Physician Assistant

## 2017-05-19 ENCOUNTER — Encounter: Payer: Self-pay | Admitting: Physician Assistant

## 2017-05-19 VITALS — BP 130/93 | HR 99 | Ht 65.25 in | Wt 178.8 lb

## 2017-05-19 DIAGNOSIS — R002 Palpitations: Secondary | ICD-10-CM

## 2017-05-19 DIAGNOSIS — R5383 Other fatigue: Secondary | ICD-10-CM | POA: Diagnosis not present

## 2017-05-19 DIAGNOSIS — R931 Abnormal findings on diagnostic imaging of heart and coronary circulation: Secondary | ICD-10-CM | POA: Diagnosis not present

## 2017-05-19 DIAGNOSIS — R079 Chest pain, unspecified: Secondary | ICD-10-CM

## 2017-05-19 NOTE — Telephone Encounter (Signed)
Encounter complete. 

## 2017-05-19 NOTE — Progress Notes (Signed)
Cardiology Office Note    Date:  05/20/2017   ID:  Zoe Swanson, DOB 05-May-1976, MRN [id number]  PCP:  Laurey Morale, MD  Cardiologist:  Dr. Claiborne Billings  Chief Complaint  Patient presents with  . Follow-up    pt states feeling tired all the tie     History of Present Illness:  Zoe Swanson is a 41 y.o. female with PMH of HTN and complex regional pain syndrome in bilateral lower extremities. She was referred by her primary care physician to cardiology service for risk factor evaluation and also management of blood pressure. She has multiple complaints today including chronic dizziness. She feels like her room is spinning and sometimes feels she is going to pass out. The symptom sounds like vertigo, I planned to do a trial of meclizine. She also have chronic bilateral lower extremity pain, given her age, very unlikely to have significant peripheral arterial disease. She does have diminished pulses in the bilateral lower extremity, I wish to observe her symptom for now. She also complaining of chronic bilateral lower extremity edema, I did not see any significant swelling on my physical exam. She complained about chest pain, feeling of coldness especially in the legs and also palpitation as well. Her recent TSH and hemoglobin is normal. I do not see significant swelling in the lower extremity, however I recommended discontinuing amlodipine and switch to short-acting Cardizem to see if that will help with her palpitations. I think her palpitation is likely driven by pain. She does have snoring problem, however denies any signs of apnea. I will hold off on sleep study at this time. As far as her chest discomfort, it is unclear to me if this is related to cardiac issue, it does not have a clear exertional component. I will order a plain old treadmill test at this time for further evaluation. This is a complicated patient with multiple complaints without clear explanation for her symptom  Her  echocardiogram was obtained on 05/13/2017, this showed EF 63-01%, grade 1 diastolic dysfunction. There was a highly mobile echodensity attached to the septal wall 3 left atrium below the mitral valve, there was also a mobile density attached to the septal wall of the right atrium just below the tricuspid valve. I have reviewed the image with DOD Dr. Ellyn Hack and reader Dr. Oval Linsey, who recommended TEE to further assess. I did order 2 blood cultures, so far blood cultures has been negative after 24-36 hours. Since our last visit, she did go back to the ED with complaint of chest pain. CT angiogram of the chest was negative. She says she continued to be very fatigued all the time for unknown reason.   Past Medical History:  Diagnosis Date  . Asthma    prn inhaler and neb.  Marland Kitchen Axillary mass 07/2014   left  . CRPS (complex regional pain syndrome), lower limb    left ankle  . Hypertension    under better control since adding HCTZ  . Migraine   . Multiple allergies     Past Surgical History:  Procedure Laterality Date  . ANKLE ARTHROTOMY Left 06/02/2011   removal of loose body; anterolateral ligament repair  . BREAST BIOPSY  2008  . BREAST LUMPECTOMY W/ NEEDLE LOCALIZATION Left 12/20/2006  . BREAST LUMPECTOMY WITH RADIOACTIVE SEED LOCALIZATION Left 08/24/2014   Procedure: LEFT BREAST LUMPECTOMY WITH RADIOACTIVE SEED LOCALIZATION;  Surgeon: Fanny Skates, MD;  Location: [facility];  Service: General;  Laterality: Left;  .  CESAREAN SECTION  06/30/2009  . KNEE ARTHROSCOPY Left 1995  . SPINAL CORD STIMULATOR IMPLANT Right 07/20/2013   buttock  . TUBAL LIGATION      Current Medications: Outpatient Medications Prior to Visit  Medication Sig Dispense Refill  . albuterol (PROVENTIL) (2.5 MG/3ML) 0.083% nebulizer solution Take 2.5 mg by nebulization every 4 (four) hours as needed for wheezing or shortness of breath (asthma).     . albuterol (VENTOLIN HFA) 108 (90 BASE) MCG/ACT inhaler  Inhale 2 puffs into the lungs every 6 (six) hours as needed for wheezing or shortness of breath (ashtma).     . ALPRAZolam (XANAX) 1 MG tablet TAKE 1 TABLET BY MOUTH TWICE DAILY AS NEEDED 60 tablet 5  . cyclobenzaprine (FLEXERIL) 10 MG tablet TAKE 1 TABLET BY MOUTH THREE TIMES DAILY AS NEEDED FOR MUSCLE SPASMS. 60 tablet 1  . diphenhydrAMINE (BENADRYL) 25 MG tablet Take 25 mg by mouth every 6 (six) hours as needed for allergies (allergic reaction).    Marland Kitchen EPINEPHrine 0.3 mg/0.3 mL IJ SOAJ injection Inject 0.3 mg into the muscle once. Reported on 01/24/2016    . hydrochlorothiazide (HYDRODIURIL) 25 MG tablet TAKE 1 TABLET BY MOUTH DAILY 30 tablet 6  . meclizine (ANTIVERT) 25 MG tablet Take 1 tablet (25 mg total) by mouth daily as needed for dizziness. 30 tablet 3  . triamcinolone cream (KENALOG) 0.1 % Apply 1 application topically 2 (two) times daily as needed. 30 g 3  . zolpidem (AMBIEN) 10 MG tablet TAKE ONE TABLET BY MOUTH EVERY NIGHT AT BEDTIME AS NEEDED 30 tablet 5  . diltiazem (CARDIZEM) 30 MG tablet Take 1 tablet (30 mg total) by mouth 3 (three) times daily. 90 tablet 3   No facility-administered medications prior to visit.      Allergies:   Pollen extract; Metoprolol; Mold extract [trichophyton]; Percocet [oxycodone-acetaminophen]; and Lisinopril   Social History   Social History  . Marital status: Married    Spouse name: N/A  . Number of children: N/A  . Years of education: N/A   Social History Main Topics  . Smoking status: Never Smoker  . Smokeless tobacco: Never Used  . Alcohol use 0.0 oz/week     Comment: occasionally  . Drug use: No  . Sexual activity: Not Asked   Other Topics Concern  . None   Social History Narrative  . None     Family History:  The patient's family history includes Diabetes Mellitus II in her mother; Hypertension in her father and mother.   ROS:   Please see the history of present illness.    ROS All other systems reviewed and are  negative.   PHYSICAL EXAM:   VS:  BP (!) 130/93   Pulse 99   Ht 5' 5.25" (1.657 m)   Wt 178 lb 12.8 oz (81.1 kg)   LMP 04/21/2017 (Exact Date)   SpO2 99%   BMI 29.53 kg/m    GEN: Well nourished, well developed, in no acute distress  HEENT: normal  Neck: no JVD, carotid bruits, or masses Cardiac: RRR; no murmurs, rubs, or gallops,no edema  Respiratory:  clear to auscultation bilaterally, normal work of breathing GI: soft, nontender, nondistended, + BS MS: no deformity or atrophy  Skin: warm and dry, no rash Neuro:  Alert and Oriented x 3, Strength and sensation are intact Psych: euthymic mood, full affect  Wt Readings from Last 3 Encounters:  05/19/17 178 lb 12.8 oz (81.1 kg)  05/04/17 179 lb (81.2 kg)  04/23/17 183 lb 6.4 oz (83.2 kg)      Studies/Labs Reviewed:   EKG:  EKG is not ordered today.   Recent Labs: 08/04/2016: ALT 16 04/13/2017: TSH 1.82 05/15/2017: BUN 15; Creatinine, Ser 0.86; Hemoglobin 13.3; Platelets 425; Potassium 3.5; Sodium 135   Lipid Panel    Component Value Date/Time   CHOL 231 (H) 11/01/2013 0959   TRIG 76.0 11/01/2013 0959   HDL 62.10 11/01/2013 0959   CHOLHDL 4 11/01/2013 0959   VLDL 15.2 11/01/2013 0959   LDLCALC 112 (H) 03/25/2012 1550   LDLDIRECT 150.4 11/01/2013 0959    Additional studies/ records that were reviewed today include:   Echo 05/13/2017 LV EF: 60% -   65%  Study Conclusions  - Left ventricle: The cavity size was normal. Systolic function was   normal. The estimated ejection fraction was in the range of 60%   to 65%. Wall motion was normal; there were no regional wall   motion abnormalities. Doppler parameters are consistent with   abnormal left ventricular relaxation (grade 1 diastolic   dysfunction). - Mitral valve: Valve area by pressure half-time: 2.47 cm^2. - Left atrium: There is a highly mobile echo density as well   attached to the septal wall of the left atrium below the mitral   valve. Recommend TEE  for further clarification. - Tricuspid valve: There is a mobile echodensity attached to the   septal wall of the right atrium just below the tricuspid valve.   Consider vegetation in the correct clinical setting. Recommend   TEE for further clarification.   CTA of chest 05/15/2017 IMPRESSION: No evidence of pulmonary embolism.  Normal CT chest.   ASSESSMENT:    1. Abnormal echocardiogram   2. Chest pain, unspecified type   3. Fatigue, unspecified type   4. Palpitation      PLAN:  In order of problems listed above:  1. Abnormal echocardiogram: Recent echocardiogram showed mass in the left atrium and also tricuspid valve. Unclear cause, I did discuss the case with DOB Dr. Ellyn Hack and also repeat her Dr. Oval Linsey who recommended TEE to further assess. I obtained 2 blood cultures which so far has been negative.  - We discussed the benefit and risk of the transesophageal echocardiogram, including but not limited to 1:10,000 chance of aspiration event, bradycardia, hypotension, esophageal bleeding, esophageal hematoma or even esophageal rupture. She was agreeable to proceed.   2. Atypical chest pain: Significant family history of cardiac issue. We'll delay GXT until 1 week after TEE  3. Fatigue: Unclear cause, she has multiple complaints on initial workup.  4. Palpitation: Ativan I started her on low-dose short-acting diltiazem, she says she has significant dizziness associated with them. I think the likely cause of her palpitation is pain.  5. Complex regional pain syndrome: She has bilateral lower extremity pain, also has a neurostimulator implanted in her back.    Medication Adjustments/Labs and Tests Ordered: Current medicines are reviewed at length with the patient today.  Concerns regarding medicines are outlined above.  Medication changes, Labs and Tests ordered today are listed in the Patient Instructions below. Patient Instructions  Medication Instructions:   STOP  Diltiazem as instructed  Labwork:   No labwork ordered today.   Testing/Procedures:  Your physician has requested that you have a TEE. During a TEE, sound waves are used to create images of your heart. It provides your doctor with information about the size and shape of your heart and how well your heart's chambers  and valves are working. In this test, a transducer is attached to the end of a flexible tube that's guided down your throat and into your esophagus (the tube leading from you mouth to your stomach) to get a more detailed image of your heart. You are not awake for the procedure. Please see the instruction sheet given to you today. For further information please visit [email or web address].  Your physician has requested that you have an exercise tolerance test. For further information please visit [email or web address]. Please also follow instruction sheet, as given.   Delay exercise tolerance test until 1 week after TEE.   Follow-Up:  With Dr. Claiborne Billings as scheduled.   If you need a refill on your cardiac medications before your next appointment, please call your pharmacy.      Hilbert Corrigan, [place]  05/20/2017 12:09 AM    [address], [place], [place]  [address] Phone: [phone]; Fax: [phone]

## 2017-05-19 NOTE — Patient Instructions (Addendum)
Medication Instructions:   STOP Diltiazem as instructed  Labwork:   No labwork ordered today.   Testing/Procedures:  Your physician has requested that you have a TEE. During a TEE, sound waves are used to create images of your heart. It provides your doctor with information about the size and shape of your heart and how well your heart's chambers and valves are working. In this test, a transducer is attached to the end of a flexible tube that's guided down your throat and into your esophagus (the tube leading from you mouth to your stomach) to get a more detailed image of your heart. You are not awake for the procedure. Please see the instruction sheet given to you today. For further information please visit [email or web address].  Your physician has requested that you have an exercise tolerance test. For further information please visit [email or web address]. Please also follow instruction sheet, as given.   Delay exercise tolerance test until 1 week after TEE.   Follow-Up:  With Dr. Claiborne Billings as scheduled.   If you need a refill on your cardiac medications before your next appointment, please call your pharmacy.

## 2017-05-20 ENCOUNTER — Ambulatory Visit (HOSPITAL_COMMUNITY): Payer: BLUE CROSS/BLUE SHIELD | Admitting: Anesthesiology

## 2017-05-20 ENCOUNTER — Encounter (HOSPITAL_COMMUNITY): Payer: Self-pay | Admitting: *Deleted

## 2017-05-20 ENCOUNTER — Ambulatory Visit (HOSPITAL_BASED_OUTPATIENT_CLINIC_OR_DEPARTMENT_OTHER)
Admission: RE | Admit: 2017-05-20 | Discharge: 2017-05-20 | Disposition: A | Discharge status: Home / Self Care | Payer: BLUE CROSS/BLUE SHIELD | Source: Ambulatory Visit | Attending: Physician Assistant | Admitting: Physician Assistant

## 2017-05-20 ENCOUNTER — Other Ambulatory Visit: Payer: Self-pay | Admitting: Physician Assistant

## 2017-05-20 ENCOUNTER — Encounter (HOSPITAL_COMMUNITY)
Admission: RE | Disposition: A | Discharge status: Home / Self Care | Payer: Self-pay | Source: Ambulatory Visit | Attending: Cardiology

## 2017-05-20 ENCOUNTER — Ambulatory Visit (HOSPITAL_COMMUNITY)
Admission: RE | Admit: 2017-05-20 | Discharge: 2017-05-20 | Disposition: A | Discharge status: Home / Self Care | Payer: BLUE CROSS/BLUE SHIELD | Source: Ambulatory Visit | Attending: Cardiology | Admitting: Cardiology

## 2017-05-20 ENCOUNTER — Encounter: Payer: Self-pay | Admitting: Physician Assistant

## 2017-05-20 DIAGNOSIS — Z8249 Family history of ischemic heart disease and other diseases of the circulatory system: Secondary | ICD-10-CM | POA: Insufficient documentation

## 2017-05-20 DIAGNOSIS — R5383 Other fatigue: Secondary | ICD-10-CM | POA: Diagnosis not present

## 2017-05-20 DIAGNOSIS — M79662 Pain in left lower leg: Secondary | ICD-10-CM | POA: Diagnosis not present

## 2017-05-20 DIAGNOSIS — M79661 Pain in right lower leg: Secondary | ICD-10-CM | POA: Insufficient documentation

## 2017-05-20 DIAGNOSIS — Z885 Allergy status to narcotic agent status: Secondary | ICD-10-CM | POA: Diagnosis not present

## 2017-05-20 DIAGNOSIS — G8929 Other chronic pain: Secondary | ICD-10-CM | POA: Diagnosis not present

## 2017-05-20 DIAGNOSIS — R7881 Bacteremia: Secondary | ICD-10-CM

## 2017-05-20 DIAGNOSIS — G43909 Migraine, unspecified, not intractable, without status migrainosus: Secondary | ICD-10-CM | POA: Diagnosis not present

## 2017-05-20 DIAGNOSIS — F419 Anxiety disorder, unspecified: Secondary | ICD-10-CM | POA: Insufficient documentation

## 2017-05-20 DIAGNOSIS — I5189 Other ill-defined heart diseases: Secondary | ICD-10-CM

## 2017-05-20 DIAGNOSIS — R002 Palpitations: Secondary | ICD-10-CM | POA: Diagnosis not present

## 2017-05-20 DIAGNOSIS — J45909 Unspecified asthma, uncomplicated: Secondary | ICD-10-CM | POA: Insufficient documentation

## 2017-05-20 DIAGNOSIS — I083 Combined rheumatic disorders of mitral, aortic and tricuspid valves: Secondary | ICD-10-CM | POA: Diagnosis not present

## 2017-05-20 DIAGNOSIS — N6320 Unspecified lump in the left breast, unspecified quadrant: Secondary | ICD-10-CM | POA: Diagnosis not present

## 2017-05-20 DIAGNOSIS — I10 Essential (primary) hypertension: Secondary | ICD-10-CM | POA: Diagnosis not present

## 2017-05-20 DIAGNOSIS — R931 Abnormal findings on diagnostic imaging of heart and coronary circulation: Secondary | ICD-10-CM | POA: Insufficient documentation

## 2017-05-20 DIAGNOSIS — R0789 Other chest pain: Secondary | ICD-10-CM | POA: Diagnosis not present

## 2017-05-20 HISTORY — PX: TEE WITHOUT CARDIOVERSION: SHX5443

## 2017-05-20 LAB — CULTURE, BLOOD (SINGLE)

## 2017-05-20 SURGERY — ECHOCARDIOGRAM, TRANSESOPHAGEAL
Anesthesia: Monitor Anesthesia Care

## 2017-05-20 MED ORDER — LACTATED RINGERS IV SOLN
INTRAVENOUS | Status: DC | PRN
Start: 2017-05-20 — End: 2017-05-20
  Administered 2017-05-20: 14:00:00 via INTRAVENOUS

## 2017-05-20 MED ORDER — ONDANSETRON HCL 4 MG/2ML IJ SOLN
INTRAMUSCULAR | Status: DC | PRN
  Administered 2017-05-20: 4 mg via INTRAVENOUS

## 2017-05-20 MED ORDER — DEXAMETHASONE SODIUM PHOSPHATE 10 MG/ML IJ SOLN
INTRAMUSCULAR | Status: DC | PRN
  Administered 2017-05-20: 10 mg via INTRAVENOUS

## 2017-05-20 MED ORDER — SODIUM CHLORIDE 0.9 % IV SOLN: INTRAVENOUS | Status: DC

## 2017-05-20 MED ORDER — PROPOFOL 10 MG/ML IV BOLUS
INTRAVENOUS | Status: DC | PRN
  Administered 2017-05-20: 30 mg via INTRAVENOUS
  Administered 2017-05-20: 20 mg via INTRAVENOUS
  Administered 2017-05-20: 40 mg via INTRAVENOUS
  Administered 2017-05-20: 20 mg via INTRAVENOUS

## 2017-05-20 MED ORDER — PROPOFOL 500 MG/50ML IV EMUL
INTRAVENOUS | Status: DC | PRN
  Administered 2017-05-20: 100 ug/kg/min via INTRAVENOUS

## 2017-05-20 MED ORDER — BUTAMBEN-TETRACAINE-BENZOCAINE 2-2-14 % EX AERO
INHALATION_SPRAY | CUTANEOUS | Status: DC | PRN
  Administered 2017-05-20: 2 via TOPICAL

## 2017-05-20 NOTE — Anesthesia Procedure Notes (Signed)
Procedure Name: MAC Date/Time: 05/20/2017 2:15 PM Performed by: Mervyn Gay Pre-anesthesia Checklist: Patient identified, Patient being monitored, Timeout performed, Emergency Drugs available and Suction available Patient Re-evaluated:Patient Re-evaluated prior to induction Oxygen Delivery Method: Nasal cannula Number of attempts: 1 Placement Confirmation: positive ETCO2 Dental Injury: Teeth and Oropharynx as per pre-operative assessment

## 2017-05-20 NOTE — Interval H&P Note (Signed)
History and Physical Interval Note:  05/20/2017 1:54 PM  Zoe Swanson  has presented today for surgery, with the diagnosis of R/O BACTEREMIA  The various methods of treatment have been discussed with the patient and family. After consideration of risks, benefits and other options for treatment, the patient has consented to  Procedure(s): TRANSESOPHAGEAL ECHOCARDIOGRAM (TEE) (N/A) as a surgical intervention .  The patient's history has been reviewed, patient examined, no change in status, stable for surgery.  I have reviewed the patient's chart and labs.  Questions were answered to the patient's satisfaction.     Fransico Him

## 2017-05-20 NOTE — Transfer of Care (Signed)
Immediate Anesthesia Transfer of Care Note  Patient: Zoe Swanson  Procedure(s) Performed: Procedure(s): TRANSESOPHAGEAL ECHOCARDIOGRAM (TEE) (N/A)  Patient Location: Endoscopy Unit  Anesthesia Type:MAC  Level of Consciousness: drowsy and patient cooperative  Airway & Oxygen Therapy: Patient Spontanous Breathing and Patient connected to nasal cannula oxygen  Post-op Assessment: Report given to RN, Post -op Vital signs reviewed and stable and Patient moving all extremities X 4  Post vital signs: Reviewed and stable  Last Vitals:  Vitals:   05/20/17 1322  BP: 131/89  Pulse: 84  Resp: 16    Last Pain:  Vitals:   05/20/17 1322  TempSrc: Oral         Complications: No apparent anesthesia complications

## 2017-05-20 NOTE — Anesthesia Preprocedure Evaluation (Addendum)
Anesthesia Evaluation  Patient identified by MRN, date of birth, ID band Patient awake    Reviewed: Allergy & Precautions, H&P , NPO status , Patient's Chart, lab work & pertinent test results  Airway Mallampati: I  TM Distance: >3 FB Neck ROM: Full    Dental   Pulmonary asthma ,    Pulmonary exam normal        Cardiovascular hypertension, Pt. on medications Normal cardiovascular exam Rhythm:Regular Rate:Normal     Neuro/Psych PSYCHIATRIC DISORDERS Anxiety  Neuromuscular disease    GI/Hepatic   Endo/Other    Renal/GU      Musculoskeletal   Abdominal   Peds  Hematology   Anesthesia Other Findings   Reproductive/Obstetrics                            Anesthesia Physical  Anesthesia Plan  ASA: II  Anesthesia Plan: MAC   Post-op Pain Management:    Induction: Intravenous  PONV Risk Score and Plan: 2 and Ondansetron, Dexamethasone, Treatment may vary due to age or medical condition and Midazolam  Airway Management Planned: Nasal Cannula, Natural Airway and Simple Face Mask  Additional Equipment:   Intra-op Plan:   Post-operative Plan: Extubation in OR  Informed Consent: I have reviewed the patients History and Physical, chart, labs and discussed the procedure including the risks, benefits and alternatives for the proposed anesthesia with the patient or authorized representative who has indicated his/her understanding and acceptance.     Plan Discussed with: CRNA and Surgeon  Anesthesia Plan Comments:         Anesthesia Quick Evaluation

## 2017-05-20 NOTE — Progress Notes (Signed)
  Echocardiogram Echocardiogram Transesophageal has been performed.  Zoe Swanson 05/20/2017, 3:10 PM

## 2017-05-20 NOTE — H&P (View-Only) (Signed)
Cardiology Office Note    Date:  05/20/2017   ID:  Zoe Swanson, DOB 12/15/1975, MRN [id number]  PCP:  Laurey Morale, MD  Cardiologist:  Dr. Claiborne Billings  Chief Complaint  Patient presents with  . Follow-up    pt states feeling tired all the tie     History of Present Illness:  Zoe Swanson is a 41 y.o. female with PMH of HTN and complex regional pain syndrome in bilateral lower extremities. She was referred by her primary care physician to cardiology service for risk factor evaluation and also management of blood pressure. She has multiple complaints today including chronic dizziness. She feels like her room is spinning and sometimes feels she is going to pass out. The symptom sounds like vertigo, I planned to do a trial of meclizine. She also have chronic bilateral lower extremity pain, given her age, very unlikely to have significant peripheral arterial disease. She does have diminished pulses in the bilateral lower extremity, I wish to observe her symptom for now. She also complaining of chronic bilateral lower extremity edema, I did not see any significant swelling on my physical exam. She complained about chest pain, feeling of coldness especially in the legs and also palpitation as well. Her recent TSH and hemoglobin is normal. I do not see significant swelling in the lower extremity, however I recommended discontinuing amlodipine and switch to short-acting Cardizem to see if that will help with her palpitations. I think her palpitation is likely driven by pain. She does have snoring problem, however denies any signs of apnea. I will hold off on sleep study at this time. As far as her chest discomfort, it is unclear to me if this is related to cardiac issue, it does not have a clear exertional component. I will order a plain old treadmill test at this time for further evaluation. This is a complicated patient with multiple complaints without clear explanation for her symptom  Her  echocardiogram was obtained on 05/13/2017, this showed EF 37-85%, grade 1 diastolic dysfunction. There was a highly mobile echodensity attached to the septal wall 3 left atrium below the mitral valve, there was also a mobile density attached to the septal wall of the right atrium just below the tricuspid valve. I have reviewed the image with DOD Dr. Ellyn Hack and reader Dr. Oval Linsey, who recommended TEE to further assess. I did order 2 blood cultures, so far blood cultures has been negative after 24-36 hours. Since our last visit, she did go back to the ED with complaint of chest pain. CT angiogram of the chest was negative. She says she continued to be very fatigued all the time for unknown reason.   Past Medical History:  Diagnosis Date  . Asthma    prn inhaler and neb.  Marland Kitchen Axillary mass 07/2014   left  . CRPS (complex regional pain syndrome), lower limb    left ankle  . Hypertension    under better control since adding HCTZ  . Migraine   . Multiple allergies     Past Surgical History:  Procedure Laterality Date  . ANKLE ARTHROTOMY Left 06/02/2011   removal of loose body; anterolateral ligament repair  . BREAST BIOPSY  2008  . BREAST LUMPECTOMY W/ NEEDLE LOCALIZATION Left 12/20/2006  . BREAST LUMPECTOMY WITH RADIOACTIVE SEED LOCALIZATION Left 08/24/2014   Procedure: LEFT BREAST LUMPECTOMY WITH RADIOACTIVE SEED LOCALIZATION;  Surgeon: Fanny Skates, MD;  Location: [facility];  Service: General;  Laterality: Left;  .  CESAREAN SECTION  06/30/2009  . KNEE ARTHROSCOPY Left 1995  . SPINAL CORD STIMULATOR IMPLANT Right 07/20/2013   buttock  . TUBAL LIGATION      Current Medications: Outpatient Medications Prior to Visit  Medication Sig Dispense Refill  . albuterol (PROVENTIL) (2.5 MG/3ML) 0.083% nebulizer solution Take 2.5 mg by nebulization every 4 (four) hours as needed for wheezing or shortness of breath (asthma).     . albuterol (VENTOLIN HFA) 108 (90 BASE) MCG/ACT inhaler  Inhale 2 puffs into the lungs every 6 (six) hours as needed for wheezing or shortness of breath (ashtma).     . ALPRAZolam (XANAX) 1 MG tablet TAKE 1 TABLET BY MOUTH TWICE DAILY AS NEEDED 60 tablet 5  . cyclobenzaprine (FLEXERIL) 10 MG tablet TAKE 1 TABLET BY MOUTH THREE TIMES DAILY AS NEEDED FOR MUSCLE SPASMS. 60 tablet 1  . diphenhydrAMINE (BENADRYL) 25 MG tablet Take 25 mg by mouth every 6 (six) hours as needed for allergies (allergic reaction).    Marland Kitchen EPINEPHrine 0.3 mg/0.3 mL IJ SOAJ injection Inject 0.3 mg into the muscle once. Reported on 01/24/2016    . hydrochlorothiazide (HYDRODIURIL) 25 MG tablet TAKE 1 TABLET BY MOUTH DAILY 30 tablet 6  . meclizine (ANTIVERT) 25 MG tablet Take 1 tablet (25 mg total) by mouth daily as needed for dizziness. 30 tablet 3  . triamcinolone cream (KENALOG) 0.1 % Apply 1 application topically 2 (two) times daily as needed. 30 g 3  . zolpidem (AMBIEN) 10 MG tablet TAKE ONE TABLET BY MOUTH EVERY NIGHT AT BEDTIME AS NEEDED 30 tablet 5  . diltiazem (CARDIZEM) 30 MG tablet Take 1 tablet (30 mg total) by mouth 3 (three) times daily. 90 tablet 3   No facility-administered medications prior to visit.      Allergies:   Pollen extract; Metoprolol; Mold extract [trichophyton]; Percocet [oxycodone-acetaminophen]; and Lisinopril   Social History   Social History  . Marital status: Married    Spouse name: N/A  . Number of children: N/A  . Years of education: N/A   Social History Main Topics  . Smoking status: Never Smoker  . Smokeless tobacco: Never Used  . Alcohol use 0.0 oz/week     Comment: occasionally  . Drug use: No  . Sexual activity: Not Asked   Other Topics Concern  . None   Social History Narrative  . None     Family History:  The patient's family history includes Diabetes Mellitus II in her mother; Hypertension in her father and mother.   ROS:   Please see the history of present illness.    ROS All other systems reviewed and are  negative.   PHYSICAL EXAM:   VS:  BP (!) 130/93   Pulse 99   Ht 5' 5.25" (1.657 m)   Wt 178 lb 12.8 oz (81.1 kg)   LMP 04/21/2017 (Exact Date)   SpO2 99%   BMI 29.53 kg/m    GEN: Well nourished, well developed, in no acute distress  HEENT: normal  Neck: no JVD, carotid bruits, or masses Cardiac: RRR; no murmurs, rubs, or gallops,no edema  Respiratory:  clear to auscultation bilaterally, normal work of breathing GI: soft, nontender, nondistended, + BS MS: no deformity or atrophy  Skin: warm and dry, no rash Neuro:  Alert and Oriented x 3, Strength and sensation are intact Psych: euthymic mood, full affect  Wt Readings from Last 3 Encounters:  05/19/17 178 lb 12.8 oz (81.1 kg)  05/04/17 179 lb (81.2 kg)  04/23/17 183 lb 6.4 oz (83.2 kg)      Studies/Labs Reviewed:   EKG:  EKG is not ordered today.   Recent Labs: 08/04/2016: ALT 16 04/13/2017: TSH 1.82 05/15/2017: BUN 15; Creatinine, Ser 0.86; Hemoglobin 13.3; Platelets 425; Potassium 3.5; Sodium 135   Lipid Panel    Component Value Date/Time   CHOL 231 (H) 11/01/2013 0959   TRIG 76.0 11/01/2013 0959   HDL 62.10 11/01/2013 0959   CHOLHDL 4 11/01/2013 0959   VLDL 15.2 11/01/2013 0959   LDLCALC 112 (H) 03/25/2012 1550   LDLDIRECT 150.4 11/01/2013 0959    Additional studies/ records that were reviewed today include:   Echo 05/13/2017 LV EF: 60% -   65%  Study Conclusions  - Left ventricle: The cavity size was normal. Systolic function was   normal. The estimated ejection fraction was in the range of 60%   to 65%. Wall motion was normal; there were no regional wall   motion abnormalities. Doppler parameters are consistent with   abnormal left ventricular relaxation (grade 1 diastolic   dysfunction). - Mitral valve: Valve area by pressure half-time: 2.47 cm^2. - Left atrium: There is a highly mobile echo density as well   attached to the septal wall of the left atrium below the mitral   valve. Recommend TEE  for further clarification. - Tricuspid valve: There is a mobile echodensity attached to the   septal wall of the right atrium just below the tricuspid valve.   Consider vegetation in the correct clinical setting. Recommend   TEE for further clarification.   CTA of chest 05/15/2017 IMPRESSION: No evidence of pulmonary embolism.  Normal CT chest.   ASSESSMENT:    1. Abnormal echocardiogram   2. Chest pain, unspecified type   3. Fatigue, unspecified type   4. Palpitation      PLAN:  In order of problems listed above:  1. Abnormal echocardiogram: Recent echocardiogram showed mass in the left atrium and also tricuspid valve. Unclear cause, I did discuss the case with DOB Dr. Ellyn Hack and also repeat her Dr. Oval Linsey who recommended TEE to further assess. I obtained 2 blood cultures which so far has been negative.  - We discussed the benefit and risk of the transesophageal echocardiogram, including but not limited to 1:10,000 chance of aspiration event, bradycardia, hypotension, esophageal bleeding, esophageal hematoma or even esophageal rupture. She was agreeable to proceed.   2. Atypical chest pain: Significant family history of cardiac issue. We'll delay GXT until 1 week after TEE  3. Fatigue: Unclear cause, she has multiple complaints on initial workup.  4. Palpitation: Ativan I started her on low-dose short-acting diltiazem, she says she has significant dizziness associated with them. I think the likely cause of her palpitation is pain.  5. Complex regional pain syndrome: She has bilateral lower extremity pain, also has a neurostimulator implanted in her back.    Medication Adjustments/Labs and Tests Ordered: Current medicines are reviewed at length with the patient today.  Concerns regarding medicines are outlined above.  Medication changes, Labs and Tests ordered today are listed in the Patient Instructions below. Patient Instructions  Medication Instructions:   STOP  Diltiazem as instructed  Labwork:   No labwork ordered today.   Testing/Procedures:  Your physician has requested that you have a TEE. During a TEE, sound waves are used to create images of your heart. It provides your doctor with information about the size and shape of your heart and how well your heart's chambers  and valves are working. In this test, a transducer is attached to the end of a flexible tube that's guided down your throat and into your esophagus (the tube leading from you mouth to your stomach) to get a more detailed image of your heart. You are not awake for the procedure. Please see the instruction sheet given to you today. For further information please visit [email or web address].  Your physician has requested that you have an exercise tolerance test. For further information please visit [email or web address]. Please also follow instruction sheet, as given.   Delay exercise tolerance test until 1 week after TEE.   Follow-Up:  With Dr. Claiborne Billings as scheduled.   If you need a refill on your cardiac medications before your next appointment, please call your pharmacy.      Hilbert Corrigan, [place]  05/20/2017 12:09 AM    [address], [place], [place]  [address] Phone: [phone]; Fax: [phone]

## 2017-05-20 NOTE — Discharge Instructions (Signed)
TEE  YOU HAD AN CARDIAC PROCEDURE TODAY: Refer to the procedure report and other information in the discharge instructions given to you for any specific questions about what was found during the examination. If this information does not answer your questions, please call [facility] office at [phone] to clarify.   DIET: Your first meal following the procedure should be a light meal and then it is ok to progress to your normal diet. A half-sandwich or bowl of soup is an example of a good first meal. Heavy or fried foods are harder to digest and may make you feel nauseous or bloated. Drink plenty of fluids but you should avoid alcoholic beverages for 24 hours. If you had a esophageal dilation, please see attached instructions for diet.   ACTIVITY: Your care partner should take you home directly after the procedure. You should plan to take it easy, moving slowly for the rest of the day. You can resume normal activity the day after the procedure however YOU SHOULD NOT DRIVE, use power tools, machinery or perform tasks that involve climbing or major physical exertion for 24 hours (because of the sedation medicines used during the test).   SYMPTOMS TO REPORT IMMEDIATELY: A cardiologist can be reached at any hour. Please call [phone] for any of the following symptoms:  Vomiting of blood or coffee ground material  New, significant abdominal pain  New, significant chest pain or pain under the shoulder blades  Painful or persistently difficult swallowing  New shortness of breath  Black, tarry-looking or red, bloody stools  FOLLOW UP:  Please also call with any specific questions about appointments or follow up tests.  TEE  YOU HAD AN CARDIAC PROCEDURE TODAY: Refer to the procedure report and other information in the discharge instructions given to you for any specific questions about what was found during the examination. If this information does not answer your questions, please call [facility] office at [phone] to clarify.   DIET: Your first meal following the procedure should be a light meal and then it is ok to progress to your normal diet. A half-sandwich or bowl of soup is an example of a good first meal. Heavy or fried foods are harder to digest and may make you feel nauseous or bloated. Drink plenty of fluids but you should avoid alcoholic beverages for 24 hours. If you had a esophageal dilation, please see attached instructions for diet.   ACTIVITY: Your care partner should take you home directly after the procedure. You should plan to take it easy, moving slowly for the rest of the day. You can resume normal activity the day after the procedure however YOU SHOULD NOT DRIVE, use power tools, machinery or perform tasks that involve climbing or major physical exertion for 24 hours (because of the sedation medicines used during the test).   SYMPTOMS TO REPORT IMMEDIATELY: A cardiologist can be reached at any hour. Please call [phone] for any of the following symptoms:  Vomiting of blood or coffee ground material  New, significant abdominal pain  New, significant chest pain or pain under the shoulder blades  Painful or persistently difficult swallowing  New shortness of breath  Black, tarry-looking or red, bloody stools  FOLLOW UP:  Please also call with any specific questions about appointments or follow up tests.  TEE  YOU HAD AN CARDIAC PROCEDURE TODAY: Refer to the procedure report and other information in the discharge instructions given to you for any specific questions about what was found  during the examination. If this information does not answer your questions, please call [facility] office at [phone] to clarify.   DIET: Your first meal following the procedure should be a light meal and then it is ok to progress to your normal diet. A half-sandwich or bowl of soup is an example of a good first meal. Heavy or fried foods are harder to digest  and may make you feel nauseous or bloated. Drink plenty of fluids but you should avoid alcoholic beverages for 24 hours. If you had a esophageal dilation, please see attached instructions for diet.   ACTIVITY: Your care partner should take you home directly after the procedure. You should plan to take it easy, moving slowly for the rest of the day. You can resume normal activity the day after the procedure however YOU SHOULD NOT DRIVE, use power tools, machinery or perform tasks that involve climbing or major physical exertion for 24 hours (because of the sedation medicines used during the test).   SYMPTOMS TO REPORT IMMEDIATELY: A cardiologist can be reached at any hour. Please call [phone] for any of the following symptoms:  Vomiting of blood or coffee ground material  New, significant abdominal pain  New, significant chest pain or pain under the shoulder blades  Painful or persistently difficult swallowing  New shortness of breath  Black, tarry-looking or red, bloody stools  FOLLOW UP:  Please also call with any specific questions about appointments or follow up tests.

## 2017-05-20 NOTE — CV Procedure (Signed)
    PROCEDURE NOTE:  Procedure:  Transesophageal echocardiogram Operator:  Fransico Him, MD Indications:  Biatrial masses Complications: None  During this procedure the patient is administered a total of Propofol 660 mg to achieve and maintain moderate conscious sedation.  The patient's heart rate, blood pressure, and oxygen saturation are monitored continuously during the procedure by Anesthesia attended by Dr. Ambrose Pancoast.   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 No masses noted. Normal LA and LA appendage with no evidence of thrombus Normal TV with mild TR Normal PV with trivial PR Normal MV with trivial MR Normal trileaflet AV Aneurysmal and hypermobile interatrial septum with no evidence of shunt by colorflow dopper or agitated saline contrast. Normal thoracic and ascending aorta.  The patient tolerated the procedure well and was transferred back to their room in stable condition.  Signed: Fransico Him, MD [facility]

## 2017-05-21 ENCOUNTER — Inpatient Hospital Stay (HOSPITAL_COMMUNITY)
Admission: RE | Admit: 2017-05-21 | Payer: BLUE CROSS/BLUE SHIELD | Source: Ambulatory Visit | Attending: Physician Assistant | Admitting: Physician Assistant

## 2017-05-21 ENCOUNTER — Encounter (HOSPITAL_COMMUNITY): Payer: Self-pay | Admitting: Cardiology

## 2017-05-21 NOTE — Anesthesia Postprocedure Evaluation (Signed)
Anesthesia Post Note  Patient: Zoe Swanson  Procedure(s) Performed: Procedure(s) (LRB): TRANSESOPHAGEAL ECHOCARDIOGRAM (TEE) (N/A)     Patient location during evaluation: PACU Anesthesia Type: MAC Level of consciousness: awake and alert Pain management: pain level controlled Vital Signs Assessment: post-procedure vital signs reviewed and stable Respiratory status: spontaneous breathing, nonlabored ventilation, respiratory function stable and patient connected to nasal cannula oxygen Cardiovascular status: stable and blood pressure returned to baseline Anesthetic complications: no    Last Vitals:  Vitals:   05/20/17 1520 05/20/17 1530  BP: (!) 142/97   Pulse: 79 (!) 105  Resp: 20 16  Temp:      Last Pain:  Vitals:   05/20/17 1508  TempSrc: Oral                 Elyce Zollinger     

## 2017-05-21 NOTE — Anesthesia Postprocedure Evaluation (Signed)
Anesthesia Post Note  Patient: Zoe Swanson  Procedure(s) Performed: Procedure(s) (LRB): TRANSESOPHAGEAL ECHOCARDIOGRAM (TEE) (N/A)     Patient location during evaluation: PACU Anesthesia Type: MAC Level of consciousness: awake and alert Pain management: pain level controlled Vital Signs Assessment: post-procedure vital signs reviewed and stable Respiratory status: spontaneous breathing, nonlabored ventilation, respiratory function stable and patient connected to nasal cannula oxygen Cardiovascular status: stable and blood pressure returned to baseline Anesthetic complications: no    Last Vitals:  Vitals:   05/20/17 1520 05/20/17 1530  BP: (!) 142/97   Pulse: 79 (!) 105  Resp: 20 16  Temp:      Last Pain:  Vitals:   05/20/17 1508  TempSrc: Oral                 [name]

## 2017-05-24 ENCOUNTER — Encounter (HOSPITAL_COMMUNITY): Payer: Self-pay

## 2017-05-24 ENCOUNTER — Ambulatory Visit (HOSPITAL_COMMUNITY): Admit: 2017-05-24 | Payer: BLUE CROSS/BLUE SHIELD | Admitting: Internal Medicine

## 2017-05-24 SURGERY — ECHOCARDIOGRAM, TRANSESOPHAGEAL
Anesthesia: Moderate Sedation

## 2017-05-31 ENCOUNTER — Other Ambulatory Visit: Payer: Self-pay | Admitting: Family Medicine

## 2017-06-02 ENCOUNTER — Telehealth (HOSPITAL_COMMUNITY): Payer: Self-pay

## 2017-06-02 NOTE — Telephone Encounter (Signed)
Encounter complete. 

## 2017-06-02 NOTE — Telephone Encounter (Signed)
Call in #60 with 5 rf 

## 2017-06-03 NOTE — Telephone Encounter (Signed)
Pt Rx for Alprazolam 1 mg was phoned in to patient pharmacy# [phone] with 5 refills.

## 2017-06-04 ENCOUNTER — Ambulatory Visit (HOSPITAL_COMMUNITY)
Admission: RE | Admit: 2017-06-04 | Discharge: 2017-06-04 | Disposition: A | Discharge status: Home / Self Care | Payer: BLUE CROSS/BLUE SHIELD | Source: Ambulatory Visit | Attending: Cardiovascular Disease | Admitting: Cardiovascular Disease

## 2017-06-04 DIAGNOSIS — R079 Chest pain, unspecified: Secondary | ICD-10-CM | POA: Insufficient documentation

## 2017-06-04 DIAGNOSIS — I517 Cardiomegaly: Secondary | ICD-10-CM | POA: Insufficient documentation

## 2017-06-04 LAB — EXERCISE TOLERANCE TEST
CSEPED: 7 min
CSEPEDS: 23 s
CSEPHR: 84 %
Estimated workload: 9.1 METS
MPHR: 178 {beats}/min
Peak HR: 150 {beats}/min
RPE: 18
Rest HR: 85 {beats}/min

## 2017-06-04 NOTE — Progress Notes (Signed)
Stress test normal.

## 2017-06-22 ENCOUNTER — Telehealth: Payer: Self-pay | Admitting: Cardiovascular Disease

## 2017-06-22 NOTE — Telephone Encounter (Signed)
Agreed.  Thank you

## 2017-06-22 NOTE — Telephone Encounter (Signed)
New message   Pt is calling asking for a call back. She needs some information for her insurance company. She said her insurance is putting her in a program they have and they some information. Please call.

## 2017-06-22 NOTE — Telephone Encounter (Signed)
Spoke to patient. She voices c/o several concerns, chiefly, does not feel like she can take her medications correctly due to combination of forgetfulness related to work stress & medication SE's   States c/o "sometimes I'm short of breath" "ankles swelling again" "both feet real cold" diarrhea, stomach issues "gas" Fatigue waking up at night w palpitations urinating frequently dizziness (which she takes antivert for)  If she wakes in the middle of the night, states she'll check her BP and it will be elevated (~160/100) then lower in AM after waking.  Pt expresses that she has other concerns as well. Advised that probably she will need to come in for APP visit for medication adjustment, etc. Pt agreeable. Aware to bring all bottles of medications to visit. I have scheduled her for Friday w Isaac Laud.   Please advise if anything further needed.

## 2017-06-22 NOTE — Telephone Encounter (Signed)
Follow up    Pt also states that she is having problems with her heart rate at night time when she is sleeping. She has some questions. Please call.

## 2017-06-25 ENCOUNTER — Ambulatory Visit: Payer: BLUE CROSS/BLUE SHIELD | Admitting: Physician Assistant

## 2017-07-02 ENCOUNTER — Other Ambulatory Visit: Payer: Self-pay | Admitting: Family Medicine

## 2017-07-06 MED ORDER — ALPRAZOLAM 1 MG PO TABS: 1.0000 mg | ORAL_TABLET | Freq: Two times a day (BID) | ORAL | 5 refills | Status: DC | PRN

## 2017-07-06 NOTE — Telephone Encounter (Signed)
Call in #60 with 5 rf 

## 2017-07-07 ENCOUNTER — Encounter: Payer: Self-pay | Admitting: Physician Assistant

## 2017-07-15 ENCOUNTER — Encounter: Payer: Self-pay | Admitting: Family Medicine

## 2017-07-29 ENCOUNTER — Telehealth: Payer: Self-pay | Admitting: Physician Assistant

## 2017-07-29 MED ORDER — NITROGLYCERIN 0.4 MG SL SUBL: 0.4000 mg | SUBLINGUAL_TABLET | SUBLINGUAL | 3 refills | Status: DC | PRN

## 2017-07-29 NOTE — Telephone Encounter (Signed)
Make sure she takes her medication. Give sublingual nitro. Her chest pain was somewhat atypical, but if worsens, she may need more urgent evaluation

## 2017-07-29 NOTE — Telephone Encounter (Signed)
Returned call to patient's w/Hao, PA's recommendations  She had an incident on 9/22 when she went to the [employer] in [place]. She started feeling faint, heart was beating fast while walking up a hill and she started getting headaches. She couldn't see with or without her glasses. She wanted this noted, b/c she states she will forget

## 2017-07-29 NOTE — Telephone Encounter (Signed)
Returned call to patient. She reports for a few days she has had sharp pains in chest. She did not take her medications yesterday d/t work - forgets. Yesterday evening she had sharp stinging in her chest and into her shoulders - she states she got "upset" yesterday and her chest started hurting really bad. She reports swelling in her ankles and always feels bloated. She reports she eats well - avoids salt. She reports she gets fatigued. Reiterated that her stress test from August 2018 was normal. She has a chiari network noted on TEE. She has an MD OV on 10/10. She states she cannot come in for an appt until tomorrow or after d/t financial reasons. Informed her would route to PA for recommendations and stressed importance of medication compliance w/patient.

## 2017-07-29 NOTE — Telephone Encounter (Signed)
Pt would like to be seen today by somebody please.Pt is still having issues with her irregular heart beats and swelling around her stomach.Had an episode with her stomach last night,tightness in it and pain going around her shoulder.Zoe Swanson

## 2017-08-04 ENCOUNTER — Ambulatory Visit (INDEPENDENT_AMBULATORY_CARE_PROVIDER_SITE_OTHER): Payer: BLUE CROSS/BLUE SHIELD | Admitting: Cardiovascular Disease

## 2017-08-04 ENCOUNTER — Encounter: Payer: Self-pay | Admitting: Cardiovascular Disease

## 2017-08-04 VITALS — BP 124/76 | HR 88 | Ht 65.5 in | Wt 185.6 lb

## 2017-08-04 DIAGNOSIS — R5383 Other fatigue: Secondary | ICD-10-CM | POA: Diagnosis not present

## 2017-08-04 DIAGNOSIS — I10 Essential (primary) hypertension: Secondary | ICD-10-CM | POA: Diagnosis not present

## 2017-08-04 DIAGNOSIS — R0683 Snoring: Secondary | ICD-10-CM | POA: Diagnosis not present

## 2017-08-04 DIAGNOSIS — R002 Palpitations: Secondary | ICD-10-CM

## 2017-08-04 DIAGNOSIS — R0789 Other chest pain: Secondary | ICD-10-CM

## 2017-08-04 DIAGNOSIS — R42 Dizziness and giddiness: Secondary | ICD-10-CM

## 2017-08-04 MED ORDER — DILTIAZEM HCL ER COATED BEADS 180 MG PO CP24: 180.0000 mg | ORAL_CAPSULE | Freq: Every day | ORAL | 3 refills | Status: DC

## 2017-08-04 NOTE — Patient Instructions (Signed)
Medication Instructions:  START diltiazem (Cardizem CD) 180 mg at bedtime  Testing/Procedures: Your physician has recommended that you wear a 14 day event monitor. Event monitors are medical devices that record the heart's electrical activity. Doctors most often [place] these monitors to diagnose arrhythmias. Arrhythmias are problems with the speed or rhythm of the heartbeat. The monitor is a small, portable device. You can wear one while you do your normal daily activities. This is usually used to diagnose what is causing palpitations/syncope (passing out).  Your physician has recommended that you have a sleep study. This test records several body functions during sleep, including: brain activity, eye movement, oxygen and carbon dioxide blood levels, heart rate and rhythm, breathing rate and rhythm, the flow of air through your mouth and nose, snoring, body muscle movements, and chest and belly movement.  Follow-Up: Your physician recommends that you schedule a follow-up appointment in: 2 MONTHS with Dr. Claiborne Billings.    Any Other Special Instructions Will Be Listed Below (If Applicable).     If you need a refill on your cardiac medications before your next appointment, please call your pharmacy.

## 2017-08-04 NOTE — Progress Notes (Signed)
Cardiology Office Note    Date:  08/09/2017   ID:  Zoe Swanson, DOB 1976-05-13, MRN [id number]  PCP:  Laurey Morale, MD  Cardiologist:  Shelva Majestic, MD   No chief complaint on file.   History of Present Illness:  Zoe Swanson is a 41 y.o. female  Ho presents to the office for initial visit with me after having been seen in July, 2018 by Almyra Deforest, [facility].  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  She has a history of hypertension and has had complex regional pain syndrome bilaterally in her lower extremities.  She uses a nerve stimulator.  She was seen in July.  On 05/04/2017 with symptoms that her room was spinning suggesting possibly vertigo.  She was also complaining of bilateral lower extremity discomfort as well as some chest pain and palpitations.  It was felt that her chest pain was atypical` and she was referred for an exercise tolerance test which was done on 06/04/2017, which reportedly was normal with good exercise capacity.  She exercised to a 9.1 metastases workload and peak heart rate of 150 without chest pain or ECG changes.  She was referred for an echo Doppler study which showed normal systolic function with an EF of 60-65%.  There was a questionable highly mobile echodensity attached to the septal wall of the left atrium below the mitral valve and a TEE was recommended.  There also was a suggestion of mobile echodensity attached to the septal wall in the right atrium.  On transesophageal general echo, she was felt to havea very prominent Chiari network.  She is had issues in that she had noticed more palpitations particularly when she went to the mountains in [place] where her heart rate spent up, developed a headache and recently has experienced some sharp pains.she had recently been started on short-acting diltiazem at 30 mg 3 times a day with continued symptoms.  Because of recurrent symptomatology, she was added onto my schedule for evaluation.  She states  that she typically goes to bed around 9 PM and wakes up at 7:30.  Her sleep is nonrestorative.  She snores. She is tired.  Oftentimes she awakens from sleep at 1 AM or 3 AM with palpitations.  She also notes some stinging chest pain.  She presents for evaluation.   Past Medical History:  Diagnosis Date  . Asthma    prn inhaler and neb.  Marland Kitchen Axillary mass 07/2014   left  . CRPS (complex regional pain syndrome), lower limb    left ankle  . Hypertension    under better control since adding HCTZ  . Migraine   . Multiple allergies     Past Surgical History:  Procedure Laterality Date  . ANKLE ARTHROTOMY Left 06/02/2011   removal of loose body; anterolateral ligament repair  . BREAST BIOPSY  2008  . BREAST LUMPECTOMY W/ NEEDLE LOCALIZATION Left 12/20/2006  . BREAST LUMPECTOMY WITH RADIOACTIVE SEED LOCALIZATION Left 08/24/2014   Procedure: LEFT BREAST LUMPECTOMY WITH RADIOACTIVE SEED LOCALIZATION;  Surgeon: Fanny Skates, MD;  Location: [facility];  Service: General;  Laterality: Left;  . CESAREAN SECTION  06/30/2009  . KNEE ARTHROSCOPY Left 1995  . SPINAL CORD STIMULATOR IMPLANT Right 07/20/2013   buttock  . TEE WITHOUT CARDIOVERSION N/A 05/20/2017   Procedure: TRANSESOPHAGEAL ECHOCARDIOGRAM (TEE);  Surgeon: Sueanne Margarita, MD;  Location: [facility];  Service: Cardiovascular;  Laterality: N/A;  . TUBAL LIGATION  Current Medications: Outpatient Medications Prior to Visit  Medication Sig Dispense Refill  . albuterol (PROVENTIL) (2.5 MG/3ML) 0.083% nebulizer solution Take 2.5 mg by nebulization every 4 (four) hours as needed for wheezing or shortness of breath (asthma).     . albuterol (VENTOLIN HFA) 108 (90 BASE) MCG/ACT inhaler Inhale 2 puffs into the lungs every 6 (six) hours as needed for wheezing or shortness of breath (ashtma).     . ALPRAZolam (XANAX) 1 MG tablet Take 1 tablet (1 mg total) by mouth 2 (two) times daily as needed. 60 tablet 5  . cyclobenzaprine  (FLEXERIL) 10 MG tablet TAKE 1 TABLET BY MOUTH THREE TIMES DAILY AS NEEDED FOR MUSCLE SPASMS. 60 tablet 1  . diphenhydrAMINE (BENADRYL) 25 MG tablet Take 25 mg by mouth every 6 (six) hours as needed for allergies (allergic reaction).    Marland Kitchen EPINEPHrine 0.3 mg/0.3 mL IJ SOAJ injection Inject 0.3 mg into the muscle once. Reported on 01/24/2016    . hydrochlorothiazide (HYDRODIURIL) 25 MG tablet TAKE 1 TABLET BY MOUTH DAILY 30 tablet 6  . meclizine (ANTIVERT) 25 MG tablet Take 1 tablet (25 mg total) by mouth daily as needed for dizziness. 30 tablet 3  . nitroGLYCERIN (NITROSTAT) 0.4 MG SL tablet Place 1 tablet (0.4 mg total) under the tongue every 5 (five) minutes as needed for chest pain. MAX 3 doses 25 tablet 3  . triamcinolone cream (KENALOG) 0.1 % Apply 1 application topically 2 (two) times daily as needed. 30 g 3  . zolpidem (AMBIEN) 10 MG tablet TAKE ONE TABLET BY MOUTH EVERY NIGHT AT BEDTIME AS NEEDED 30 tablet 5   No facility-administered medications prior to visit.      Allergies:   Pollen extract; Metoprolol; Mold extract [trichophyton]; Percocet [oxycodone-acetaminophen]; and Lisinopril   Social History   Social History  . Marital status: Married    Spouse name: N/A  . Number of children: N/A  . Years of education: N/A   Social History Main Topics  . Smoking status: Never Smoker  . Smokeless tobacco: Never Used  . Alcohol use 0.0 oz/week     Comment: occasionally  . Drug use: No  . Sexual activity: Not Asked   Other Topics Concern  . None   Social History Narrative  . None    She ran track at [employer] and performed in the triple jump in college.  Family History:  The patient's family history includes Diabetes Mellitus II in her mother; Hypertension in her father and mother.. There was heart disease in her grandparents.  ROS General: Negative; No fevers, chills, or night sweats;  HEENT: Negative; No changes in vision or hearing, sinus congestion, difficulty  swallowing Pulmonary: Negative; No cough, wheezing, shortness of breath, hemoptysis Cardiovascular: see history of present illness GI: Negative; No nausea, vomiting, diarrhea, or abdominal pain GU: Negative; No dysuria, hematuria, or difficulty voiding Musculoskeletal: plantar fasciitis Hematologic/Oncology: Negative; no easy bruising, bleeding Endocrine: Negative; no heat/cold intolerance; no diabetes Neuro: occasional dizziness Skin: Negative; No rashes or skin lesions Psychiatric: Negative; No behavioral problems, depression Sleep: positive for frequent awakenings, non restorative sleep, snoring daytime sleepiness, hypersomnolence, no bruxism, restless legs, hypnogognic hallucinations, no cataplexy Other comprehensive 14 point system review is negative.   PHYSICAL EXAM:   VS:  BP 124/76   Pulse 88   Ht 5' 5.5" (1.664 m)   Wt 185 lb 9.6 oz (84.2 kg)   BMI 30.42 kg/m    Repeat blood pressure by me was 134/84  Wt  Readings from Last 3 Encounters:  08/04/17 185 lb 9.6 oz (84.2 kg)  05/19/17 178 lb 12.8 oz (81.1 kg)  05/04/17 179 lb (81.2 kg)    General: Alert, oriented, no distress.  Skin: normal turgor, no rashes, warm and dry HEENT: Normocephalic, atraumatic. Pupils equal round and reactive to light; sclera anicteric; extraocular muscles intact; Fundi no arteriolar narrowing or hemorrhages or exudates.  Disks flat Nose without nasal septal hypertrophy Mouth/Parynx benign; Mallinpatti scale 3 Neck: No JVD, no carotid bruits; normal carotid upstroke Lungs: clear to ausculatation and percussion; no wheezing or rales Chest wall: without tenderness to palpitation Heart: PMI not displaced, RRR, s1 s2 normal, 1/6 systolic murmur, no diastolic murmur, no rubs, gallops, thrills, or heaves Abdomen: soft, nontender; no hepatosplenomehaly, BS+; abdominal aorta nontender and not dilated by palpation. Back: no CVA tenderness Pulses 2+ Musculoskeletal: full range of motion, normal strength,  no joint deformities Extremities: no clubbing cyanosis or edema, Homan's sign negative  Neurologic: grossly nonfocal; Cranial nerves grossly wnl Psychologic: Normal mood and affect   Studies/Labs Reviewed:   EKG:  EKG is ordered today.  ECG (independently read by me): Normal sinus rhythm at 88 bpm.  PR interval 146 ms.  QTc interval 452 ms.  No ST segment changes.  Recent Labs: BMP Latest Ref Rng & Units 05/15/2017 04/13/2017 08/04/2016  Glucose 65 - 99 mg/dL 82 126(H) 84  BUN 6 - 20 mg/dL 15 16 15   Creatinine 0.44 - 1.00 mg/dL 0.86 0.93 0.86  Sodium 135 - 145 mmol/L 135 137 138  Potassium 3.5 - 5.1 mmol/L 3.5 3.6 3.9  Chloride 101 - 111 mmol/L 101 99 100  CO2 22 - 32 mmol/L 26 29 28   Calcium 8.9 - 10.3 mg/dL 9.5 9.8 10.2     Hepatic Function Latest Ref Rng & Units 08/04/2016 08/20/2014 11/01/2013  Total Protein 6.0 - 8.3 g/dL 8.0 8.6(H) 7.8  Albumin 3.5 - 5.2 g/dL 4.5 4.4 4.2  AST 0 - 37 U/L 17 18 17   ALT 0 - 35 U/L 16 12 13   Alk Phosphatase 39 - 117 U/L 91 102 76  Total Bilirubin 0.2 - 1.2 mg/dL 0.2 <0.2(L) 0.5  Bilirubin, Direct 0.0 - 0.3 mg/dL 0.1 - 0.0    CBC Latest Ref Rng & Units 05/15/2017 04/13/2017 08/04/2016  WBC 4.0 - 10.5 K/uL 8.7 9.1 10.4  Hemoglobin 12.0 - 15.0 g/dL 13.3 13.1 12.9  Hematocrit 36.0 - 46.0 % 40.7 40.0 39.4  Platelets 150 - 400 K/uL 425(H) 400.0 423.0(H)   Lab Results  Component Value Date   MCV 81.2 05/15/2017   MCV 81.4 04/13/2017   MCV 80.2 08/04/2016   Lab Results  Component Value Date   TSH 1.82 04/13/2017   Lab Results  Component Value Date   HGBA1C 5.9 04/13/2017     BNP No results found for: BNP  ProBNP No results found for: PROBNP   Lipid Panel     Component Value Date/Time   CHOL 231 (H) 11/01/2013 0959   TRIG 76.0 11/01/2013 0959   HDL 62.10 11/01/2013 0959   CHOLHDL 4 11/01/2013 0959   VLDL 15.2 11/01/2013 0959   LDLCALC 112 (H) 03/25/2012 1550   LDLDIRECT 150.4 11/01/2013 0959     RADIOLOGY: No results  found.   Additional studies/ records that were reviewed today include:  I reviewed the echo Doppler study, TEE, GXT, and office notes    ASSESSMENT:    1. Palpitations   2. Fatigue, unspecified type   3.  Essential hypertension   4. Snoring   5. Atypical chest pain   6. Vertigo      PLAN:  Ms. Mendel Ryder is a 41 year old African-American female who ran  Track in college and did the triple jump.  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 Recently, she has had experiences with increasing palpitations as well as some sharp twinges of chest pain.  She also has had issues with possible vertigo which was not significantly improvewith a trial of meclizine.  A routine graded exercise treadmill test was negative for ischemia and did not show any ECG changes.  She was able to achieve a peak heart rate, representing 84% of predicted maximum and a workload of 9.1 METs without ECG changes.  She has noticed more palpitations recently and had been started on short acting diltiazem 30 mg 3 times per day.  She still experiences that her heart races and particularly this seems to race more in the early morning.  I have recommended that she wear a 2 week event monitor for further evaluation of her potential arrhythmia.  She admits to a history of hypertension for 8-10 years and currently has been taking HCTZ 25 mg daily.  With her increased palpitations and blood pressure history.  I am changing diltiazem to long-acting CD 180 mg but she can take the 30 mg on a when necessary basis if breakthrough palpitations developed.  I reviewed her echo Doppler as well as her TEE and she has comment a network in her right atrium that was likely the source of abnormality seen on transthoracic echo.  There was no evidence for an LA or RA mass.  I discussed this with her. She is also having symptoms concerning for the possibility of sleep apnea which may be exacerbating her palpitations if she is developing  nocturnal oxygen desaturation.  I have recommended that she undergo a sleep study for further evaluation.  I have reviewed her recent laboratory from several months ago.  I will see her back in the office in 2 months for reevaluation and further recommendations will be made at that time.   Medication Adjustments/Labs and Tests Ordered: Current medicines are reviewed at length with the patient today.  Concerns regarding medicines are outlined above.  Medication changes, Labs and Tests ordered today are listed in the Patient Instructions below. Patient Instructions  Medication Instructions:  START diltiazem (Cardizem CD) 180 mg at bedtime  Testing/Procedures: Your physician has recommended that you wear a 14 day event monitor. Event monitors are medical devices that record the heart's electrical activity. Doctors most often [place] these monitors to diagnose arrhythmias. Arrhythmias are problems with the speed or rhythm of the heartbeat. The monitor is a small, portable device. You can wear one while you do your normal daily activities. This is usually used to diagnose what is causing palpitations/syncope (passing out).  Your physician has recommended that you have a sleep study. This test records several body functions during sleep, including: brain activity, eye movement, oxygen and carbon dioxide blood levels, heart rate and rhythm, breathing rate and rhythm, the flow of air through your mouth and nose, snoring, body muscle movements, and chest and belly movement.  Follow-Up: Your physician recommends that you schedule a follow-up appointment in: 2 MONTHS with Dr. Claiborne Billings.    Any Other Special Instructions Will Be Listed Below (If Applicable).     If you need a refill on your cardiac medications before your next appointment, please call your  pharmacy.       Time spent: 45 minutes Signed, Shelva Majestic, MD  08/09/2017 6:44 PM    [place] [address], [place], [place], [place]  [address] Phone: [phone]

## 2017-08-19 ENCOUNTER — Ambulatory Visit (INDEPENDENT_AMBULATORY_CARE_PROVIDER_SITE_OTHER): Payer: BLUE CROSS/BLUE SHIELD

## 2017-08-19 DIAGNOSIS — R002 Palpitations: Secondary | ICD-10-CM | POA: Diagnosis not present

## 2017-08-26 ENCOUNTER — Telehealth: Payer: Self-pay | Admitting: Physician Assistant

## 2017-08-26 NOTE — Telephone Encounter (Signed)
[facility] called stating the patient noted a syncopal event. EKG with ST in the 120s. They cannot get a hold of the patient by phone. I asked them to facilitate calling EMS to go check on the patient. They said they will attempt one more phone call and then will contact EMS for a wellness check.  Ledora Bottcher, PA-C 08/26/2017, 5:44 PM [place] [address] [place], [place] [address]

## 2017-08-27 ENCOUNTER — Telehealth: Payer: Self-pay

## 2017-08-27 NOTE — Telephone Encounter (Signed)
Spoke to patient we received monitor strips from 08/26/17 at 3:06 pm, which revealed sinus tach at rate 122.Stated she does not remember what she was doing.Stated she has fast heart beat every morning when she wakes up.Stated she just started Cardizem CD 180 mg daily yesterday.Stated she feels ok at present.Spoke to [facility] he advised no change continue Cardizem CD 180 mg daily.Advised to continue wearing monitor as ordered.

## 2017-09-13 ENCOUNTER — Ambulatory Visit (HOSPITAL_BASED_OUTPATIENT_CLINIC_OR_DEPARTMENT_OTHER): Payer: BLUE CROSS/BLUE SHIELD | Attending: Cardiovascular Disease | Admitting: Cardiovascular Disease

## 2017-09-13 ENCOUNTER — Encounter (HOSPITAL_BASED_OUTPATIENT_CLINIC_OR_DEPARTMENT_OTHER): Payer: BLUE CROSS/BLUE SHIELD

## 2017-09-13 VITALS — Ht 66.0 in | Wt 189.0 lb

## 2017-09-13 DIAGNOSIS — I10 Essential (primary) hypertension: Secondary | ICD-10-CM | POA: Insufficient documentation

## 2017-09-13 DIAGNOSIS — R5383 Other fatigue: Secondary | ICD-10-CM | POA: Insufficient documentation

## 2017-09-13 DIAGNOSIS — R002 Palpitations: Secondary | ICD-10-CM | POA: Diagnosis not present

## 2017-09-13 DIAGNOSIS — G4730 Sleep apnea, unspecified: Secondary | ICD-10-CM | POA: Diagnosis not present

## 2017-09-27 ENCOUNTER — Encounter: Payer: Self-pay | Admitting: Cardiovascular Disease

## 2017-09-28 ENCOUNTER — Telehealth: Payer: Self-pay | Admitting: Cardiovascular Disease

## 2017-09-28 ENCOUNTER — Ambulatory Visit: Payer: BLUE CROSS/BLUE SHIELD | Admitting: Cardiology

## 2017-09-28 NOTE — Telephone Encounter (Signed)
Returned call to patient.She stated she had to stop taking [place].Stated she did not start taking Cartia until 08/26/17.Stated since then she has gained 11 lbs,sob non productive cough.Stated B/P elevated 153/102,153/104.Stated she did not sleep last night sob and non productive cough.Appointment scheduled with Kerin Ransom PA today at 3:00 pm.Advised to bring a list of all medications.

## 2017-09-28 NOTE — Telephone Encounter (Signed)
Pt calling back from yesterday,asking for [facility]. Ovid Curd said to refer pt to triage pool.

## 2017-10-04 ENCOUNTER — Encounter (HOSPITAL_BASED_OUTPATIENT_CLINIC_OR_DEPARTMENT_OTHER): Payer: Self-pay | Admitting: Cardiovascular Disease

## 2017-10-04 NOTE — Procedures (Signed)
Patient Name: Zoe Swanson, Deats Date: 09/13/2017 Gender: Female D.O.B: Jun 02, 1976 Age (years): 70 Referring Provider: Shelva Majestic MD, ABSM Height (inches): 66 Interpreting Physician: Shelva Majestic MD, ABSM Weight (lbs): 189 RPSGT: Carolin Coy BMI: 31 MRN: [id number] Neck Size: 13.00  CLINICAL INFORMATION Sleep Study Type: NPSG  Indication for sleep study: Excessive Daytime Sleepiness, Fatigue, Hypertension, Snoring   Epworth Sleepiness Score: 10  SLEEP STUDY TECHNIQUE As per the AASM Manual for the Scoring of Sleep and Associated Events v2.3 (April 2016) with a hypopnea requiring 4% desaturations.  The channels recorded and monitored were frontal, central and occipital EEG, electrooculogram (EOG), submentalis EMG (chin), nasal and oral airflow, thoracic and abdominal wall motion, anterior tibialis EMG, snore microphone, electrocardiogram, and pulse oximetry.  MEDICATIONS *    albuterol (PROVENTIL) (2.5 MG/3ML) 0.083% nebulizer solution    Take 2.5 mg by nebulization every 4 (four) hours as needed for wheezing or shortness of breath (asthma).            *    albuterol (VENTOLIN HFA) 108 (90 BASE) MCG/ACT inhaler    Inhale 2 puffs into the lungs every 6 (six) hours as needed for wheezing or shortness of breath (ashtma).            *    ALPRAZolam (XANAX) 1 MG tablet    Take 1 tablet (1 mg total) by mouth 2 (two) times daily as needed.    *    cyclobenzaprine (FLEXERIL) 10 MG tablet    TAKE 1 TABLET BY MOUTH THREE TIMES DAILY AS NEEDED FOR MUSCLE SPASMS.    *    diphenhydrAMINE (BENADRYL) 25 MG tablet    Take 25 mg by mouth every 6 (six) hours as needed for allergies (allergic reaction).           *    EPINEPHrine 0.3 mg/0.3 mL IJ SOAJ injection    Inject 0.3 mg into the muscle once. Reported on 01/24/2016           *    hydrochlorothiazide (HYDRODIURIL) 25 MG tablet    TAKE 1 TABLET BY MOUTH DAILY     *    meclizine (ANTIVERT) 25 MG tablet    Take 1 tablet (25 mg total)  by mouth daily as needed for dizziness.     *    nitroGLYCERIN (NITROSTAT) 0.4 MG SL tablet    Place 1 tablet (0.4 mg total) under the tongue every 5 (five) minutes as needed for chest pain. MAX 3 doses    25 tablet    3 *    triamcinolone cream (KENALOG) 0.1 %    Apply 1 application topically 2 (two) times daily as needed.    *    zolpidem (AMBIEN) 10 MG tablet    TAKE ONE TABLET BY MOUTH EVERY NIGHT AT BEDTIME AS NEEDED        Medications self-administered by patient taken the night of the study : N/A  SLEEP ARCHITECTURE The study was initiated at 9:59:24 PM and ended at 4:00:14 AM.  Sleep onset time was 21.3 minutes and the sleep efficiency was 60.8%. The total sleep time was 219.5 minutes.  Stage REM latency was 66.5 minutes.  The patient spent 15.95% of the night in stage N1 sleep, 72.44% in stage N2 sleep, 0.00% in stage N3 and 11.62% in REM.  Alpha intrusion was absent.  Supine sleep was 36.28%.  RESPIRATORY PARAMETERS The overall apnea/hypopnea index (AHI) was 3.3 per hour.  The  respiratory disturbance index (RDI) was 11.5 per hour. There were 0 total apneas, including 0 obstructive, 0 central and 0 mixed apneas. There were 12 hypopneas and 30 RERAs.  The AHI during Stage REM sleep was 16.5 per hour.  AHI while supine was 6.0 per hour.  The mean oxygen saturation was 95.57%. The minimum SpO2 during sleep was 90.00%.  Moderate snoring was noted during this study.  CARDIAC DATA The 2 lead EKG demonstrated sinus rhythm. The mean heart rate was 83.66 beats per minute. Other EKG findings include: None.  LEG MOVEMENT DATA The total PLMS were 0 with a resulting PLMS index of 0.00. Associated arousal with leg movement index was 0.0 .  IMPRESSIONS - Increased upper airway resistance syndrome with an overall AHI 3.3/h, RDI 11.5/h; however, there was mild sleep apnea with supine sleep (AHI 6.0) and moderate sleep apnea during REM sleep (AHI 16.5/h). - No significant central sleep  apnea occurred during this study (CAI = 0.0/h). - The patient had minimal or no oxygen desaturation to a nadir of 90%.  - Reduced sleep efficiency at 60.8%. - The patient snored with moderate snoring volume. - No cardiac abnormalities were noted during this study. - Clinically significant periodic limb movements did not occur during sleep. No significant associated arousals.  DIAGNOSIS - sleep apnea, unspecified type G47.30  RECOMMENDATIONS - Efforts should be made to optimize nasal and oropharyngeal patency. - At present, patient does not meet criteria for CPAP therapy.  Consider initial alternatives to CPAP therapy such as possible ENT evaluation for moderate snoring or consideration for customized oral appliance.  If progressive symptoms develop, CPAP therapy will be beneficial. - Avoid alcohol, sedatives and other CNS depressants that may worsen sleep apnea and disrupt normal sleep architecture. - Sleep hygiene should be reviewed to assess factors that may improve sleep quality. - Weight management and regular exercise should be initiated or continued if appropriate.  [Electronically signed] 10/04/2017 06:03 PM  Shelva Majestic MD, [facility], ABSM Diplomate, American Board of Sleep Medicine   NPI: [id number] [place] PH: [phone]   FX: [phone] [place]

## 2017-10-06 ENCOUNTER — Telehealth: Payer: Self-pay | Admitting: *Deleted

## 2017-10-06 NOTE — Telephone Encounter (Signed)
Patient aware of results and verbalized understanding.  Return OV with Dr. Claiborne Billings 1/7 will discuss further at that time as well as alternatives.

## 2017-10-06 NOTE — Progress Notes (Signed)
Patient aware and verbalized understanding. °

## 2017-10-06 NOTE — Telephone Encounter (Signed)
-----   Message from Troy Sine, MD sent at 10/04/2017  6:10 PM EST ----- Zoe Swanson, please notify the patient of the results of her sleep study and recommendations.  She has increased upper airway resistance overall with mild sleep apnea with supine sleep, but moderate sleep apnea during REM sleep.  No significant oxygen desaturation.

## 2017-10-11 ENCOUNTER — Ambulatory Visit (INDEPENDENT_AMBULATORY_CARE_PROVIDER_SITE_OTHER): Payer: BLUE CROSS/BLUE SHIELD | Admitting: Adult Health

## 2017-10-11 ENCOUNTER — Encounter: Payer: Self-pay | Admitting: Adult Health

## 2017-10-11 VITALS — BP 126/72 | Temp 98.1°F | Wt 191.0 lb

## 2017-10-11 DIAGNOSIS — R059 Cough, unspecified: Secondary | ICD-10-CM

## 2017-10-11 DIAGNOSIS — R05 Cough: Secondary | ICD-10-CM | POA: Diagnosis not present

## 2017-10-11 MED ORDER — HYDROCODONE-HOMATROPINE 5-1.5 MG/5ML PO SYRP: 5.0000 mL | ORAL_SOLUTION | Freq: Three times a day (TID) | ORAL | 0 refills | Status: DC | PRN

## 2017-10-11 MED ORDER — PREDNISONE 10 MG PO TABS: ORAL_TABLET | ORAL | 0 refills | Status: DC

## 2017-10-11 NOTE — Progress Notes (Signed)
Subjective:    Patient ID: Zoe Swanson, female    DOB: 30-Apr-1976, 41 y.o.   MRN: [id number]  Cough  This is a new problem. The current episode started 1 to 4 weeks ago (4 weeks ago ). The cough is productive of sputum. Associated symptoms include shortness of breath. Pertinent negatives include no postnasal drip, rhinorrhea or wheezing.   Reports that this happens about once a year    Review of Systems  Constitutional: Negative.   HENT: Negative for postnasal drip, rhinorrhea, sinus pressure and sinus pain.   Respiratory: Positive for cough and shortness of breath. Negative for wheezing.   Cardiovascular: Negative.   Gastrointestinal: Negative.    Past Medical History:  Diagnosis Date  . Asthma    prn inhaler and neb.  Marland Kitchen Axillary mass 07/2014   left  . CRPS (complex regional pain syndrome), lower limb    left ankle  . Hypertension    under better control since adding HCTZ  . Migraine   . Multiple allergies     Social History   Socioeconomic History  . Marital status: Married    Spouse name: Not on file  . Number of children: Not on file  . Years of education: Not on file  . Highest education level: Not on file  Social Needs  . Financial resource strain: Not on file  . Food insecurity - worry: Not on file  . Food insecurity - inability: Not on file  . Transportation needs - medical: Not on file  . Transportation needs - non-medical: Not on file  Occupational History  . Not on file  Tobacco Use  . Smoking status: Never Smoker  . Smokeless tobacco: Never Used  Substance and Sexual Activity  . Alcohol use: Yes    Alcohol/week: 0.0 oz    Comment: occasionally  . Drug use: No  . Sexual activity: Not on file  Other Topics Concern  . Not on file  Social History Narrative  . Not on file    Past Surgical History:  Procedure Laterality Date  . ANKLE ARTHROTOMY Left 06/02/2011   removal of loose body; anterolateral ligament repair  . BREAST BIOPSY  2008  .  BREAST LUMPECTOMY W/ NEEDLE LOCALIZATION Left 12/20/2006  . BREAST LUMPECTOMY WITH RADIOACTIVE SEED LOCALIZATION Left 08/24/2014   Procedure: LEFT BREAST LUMPECTOMY WITH RADIOACTIVE SEED LOCALIZATION;  Surgeon: Fanny Skates, MD;  Location: [place];  Service: General;  Laterality: Left;  . CESAREAN SECTION  06/30/2009  . KNEE ARTHROSCOPY Left 1995  . SPINAL CORD STIMULATOR IMPLANT Right 07/20/2013   buttock  . TEE WITHOUT CARDIOVERSION N/A 05/20/2017   Procedure: TRANSESOPHAGEAL ECHOCARDIOGRAM (TEE);  Surgeon: Sueanne Margarita, MD;  Location: [facility] ENDOSCOPY;  Service: Cardiovascular;  Laterality: N/A;  . TUBAL LIGATION      Family History  Problem Relation Age of Onset  . Hypertension Mother   . Diabetes Mellitus II Mother   . Hypertension Father     Allergies  Allergen Reactions  . Pollen Extract Hives and Itching  . Metoprolol     Headaches and dizziness  . Mold Extract [Trichophyton]   . Percocet [Oxycodone-Acetaminophen]     itching  . Lisinopril Cough    Current Outpatient Medications on File Prior to Visit  Medication Sig Dispense Refill  . albuterol (PROVENTIL) (2.5 MG/3ML) 0.083% nebulizer solution Take 2.5 mg by nebulization every 4 (four) hours as needed for wheezing or shortness of breath (asthma).     Marland Kitchen  albuterol (VENTOLIN HFA) 108 (90 BASE) MCG/ACT inhaler Inhale 2 puffs into the lungs every 6 (six) hours as needed for wheezing or shortness of breath (ashtma).     . ALPRAZolam (XANAX) 1 MG tablet Take 1 tablet (1 mg total) by mouth 2 (two) times daily as needed. 60 tablet 5  . cyclobenzaprine (FLEXERIL) 10 MG tablet TAKE 1 TABLET BY MOUTH THREE TIMES DAILY AS NEEDED FOR MUSCLE SPASMS. 60 tablet 1  . diphenhydrAMINE (BENADRYL) 25 MG tablet Take 25 mg by mouth every 6 (six) hours as needed for allergies (allergic reaction).    Marland Kitchen EPINEPHrine 0.3 mg/0.3 mL IJ SOAJ injection Inject 0.3 mg into the muscle once. Reported on 01/24/2016    . hydrochlorothiazide  (HYDRODIURIL) 25 MG tablet TAKE 1 TABLET BY MOUTH DAILY 30 tablet 6  . meclizine (ANTIVERT) 25 MG tablet Take 1 tablet (25 mg total) by mouth daily as needed for dizziness. 30 tablet 3  . nitroGLYCERIN (NITROSTAT) 0.4 MG SL tablet Place 1 tablet (0.4 mg total) under the tongue every 5 (five) minutes as needed for chest pain. MAX 3 doses 25 tablet 3  . triamcinolone cream (KENALOG) 0.1 % Apply 1 application topically 2 (two) times daily as needed. 30 g 3  . zolpidem (AMBIEN) 10 MG tablet TAKE ONE TABLET BY MOUTH EVERY NIGHT AT BEDTIME AS NEEDED 30 tablet 5  . diltiazem (CARDIZEM CD) 180 MG 24 hr capsule Take 1 capsule (180 mg total) by mouth daily. (Patient not taking: Reported on 10/11/2017) 90 capsule 3   No current facility-administered medications on file prior to visit.     BP 126/72 (BP Location: Right Arm)   Temp 98.1 F (36.7 C) (Oral)   Wt 191 lb (86.6 kg)   BMI 30.83 kg/m       Objective:   Physical Exam  Constitutional: She is oriented to person, place, and time. She appears well-developed and well-nourished. No distress.  HENT:  Head: Normocephalic and atraumatic.  Right Ear: External ear normal.  Left Ear: External ear normal.  Nose: Nose normal.  Mouth/Throat: Oropharynx is clear and moist. No oropharyngeal exudate.  Eyes: Conjunctivae and EOM are normal. Pupils are equal, round, and reactive to light. Right eye exhibits no discharge. Left eye exhibits no discharge. No scleral icterus.  Neck: Normal range of motion. Neck supple.  Cardiovascular: Normal rate, regular rhythm, normal heart sounds and intact distal pulses. Exam reveals no gallop and no friction rub.  No murmur heard. Pulmonary/Chest: Effort normal and breath sounds normal. No respiratory distress. She has no wheezes. She has no rales. She exhibits no tenderness.  Neurological: She is alert and oriented to person, place, and time.  Skin: Skin is warm and dry. No rash noted. She is not diaphoretic. No  erythema. No pallor.  Psychiatric: She has a normal mood and affect. Her behavior is normal. Judgment and thought content normal.  Nursing note and vitals reviewed.     Assessment & Plan:  1. Cough - DG Chest 2 View; Future- due to time frame  - HYDROcodone-homatropine (HYCODAN) 5-1.5 MG/5ML syrup; Take 5 mLs by mouth every 8 (eight) hours as needed for cough.  Dispense: 120 mL; Refill: 0 - predniSONE (DELTASONE) 10 MG tablet; 40 mg x 3 days, 20 mg x 3 days, 10 mg x 3 days  Dispense: 21 tablet; Refill: 0  Dorothyann Peng, NP

## 2017-10-12 ENCOUNTER — Ambulatory Visit
Admission: RE | Admit: 2017-10-12 | Discharge: 2017-10-12 | Disposition: A | Discharge status: Home / Self Care | Payer: BLUE CROSS/BLUE SHIELD | Source: Ambulatory Visit | Attending: Adult Health | Admitting: Adult Health

## 2017-10-12 DIAGNOSIS — R05 Cough: Secondary | ICD-10-CM

## 2017-10-12 DIAGNOSIS — R059 Cough, unspecified: Secondary | ICD-10-CM

## 2017-10-21 ENCOUNTER — Ambulatory Visit (INDEPENDENT_AMBULATORY_CARE_PROVIDER_SITE_OTHER): Payer: BLUE CROSS/BLUE SHIELD | Admitting: Cardiology

## 2017-10-21 ENCOUNTER — Encounter: Payer: Self-pay | Admitting: Cardiology

## 2017-10-21 VITALS — BP 135/92 | HR 93 | Ht 66.0 in | Wt 190.2 lb

## 2017-10-21 DIAGNOSIS — I10 Essential (primary) hypertension: Secondary | ICD-10-CM

## 2017-10-21 DIAGNOSIS — J302 Other seasonal allergic rhinitis: Secondary | ICD-10-CM

## 2017-10-21 DIAGNOSIS — Z79899 Other long term (current) drug therapy: Secondary | ICD-10-CM

## 2017-10-21 DIAGNOSIS — R635 Abnormal weight gain: Secondary | ICD-10-CM | POA: Diagnosis not present

## 2017-10-21 NOTE — Assessment & Plan Note (Signed)
Repeat B/P by me 132/68

## 2017-10-21 NOTE — Patient Instructions (Signed)
Medication Instructions:  NO CHANGES-Your physician recommends that you continue on your current medications as directed. Please refer to the Current Medication list given to you today.  If you need a refill on your cardiac medications before your next appointment, please call your pharmacy.  Labwork: BNP TODAY HERE IN OUR OFFICE AT [facility]  Follow-Up: Your physician wants you to follow-up in: [place] BNP LAB RESULTS.     Thank you for choosing [facility] at [facility]!!

## 2017-10-21 NOTE — Assessment & Plan Note (Signed)
Pt has seen a pulmonologist. She is about to finish a steroid dose pack for dry cough.

## 2017-10-21 NOTE — Progress Notes (Signed)
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 She saw Dr Claiborne Billings in July 2018. The pt has HTN and complex regional pain syndrome in bilateral lower extremities. She has a neuro stimulator in place. She had multiple complaints then- dizziness, palpitations, dry cough, LE edema, and chest pain. Stress test was normal. TSH was normal on several occasions. Holter showed one episode of sinus tachycardia. 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 Dr Claiborne Billings saw her in follow up In Oct 2018. She had recently been started on short acting Diltiazem for tachycardia and palpitations. Dr Claiborne Billings ordered a sleep study which did not suggest significant sleep apnea.   She was to f/u with him in a week about he sleep study but called earlier c/o 20 lb wgt gain in the past few months which she attributed to Dilatiazem. The pt appears frustrated. She says no one called her about her sleep study but when I pointed out documentation of a phone call from a nurse about her results she relented and changed the subject. She feels she has gained 20 lbs in the past few months which she attributes to taking Diltiazem. She admits she hasn't taken it in more than a month. She says she has watched her diet and stepped up exercise but the wgt hasn't come off. She has multiple other complaints, palpitations, leg pain, edema (though I could not appreciate any significant LE edema).    Current Outpatient Medications  Medication Sig Dispense Refill  . albuterol (PROVENTIL) (2.5 MG/3ML) 0.083% nebulizer solution Take 2.5 mg by nebulization every 4 (four) hours as needed for wheezing or shortness of breath (asthma).     . albuterol (VENTOLIN HFA) 108 (90 BASE) MCG/ACT inhaler Inhale 2 puffs into the lungs every 6 (six) hours as needed  for wheezing or shortness of breath (ashtma).     . ALPRAZolam (XANAX) 1 MG tablet Take 1 tablet (1 mg total) by mouth 2 (two) times daily as needed. 60 tablet 5  . diphenhydrAMINE (BENADRYL) 25 MG tablet Take 25 mg by mouth every 6 (six) hours as needed for allergies (allergic reaction).    Marland Kitchen EPINEPHrine 0.3 mg/0.3 mL IJ SOAJ injection Inject 0.3 mg into the muscle once. Reported on 01/24/2016    . hydrochlorothiazide (HYDRODIURIL) 25 MG tablet TAKE 1 TABLET BY MOUTH DAILY 30 tablet 6  . meclizine (ANTIVERT) 25 MG tablet Take 1 tablet (25 mg total) by mouth daily as needed for dizziness. 30 tablet 3  . nitroGLYCERIN (NITROSTAT) 0.4 MG SL tablet Place 1 tablet (0.4 mg total) under the tongue every 5 (five) minutes as needed for chest pain. MAX 3 doses 25 tablet 3  . triamcinolone cream (KENALOG) 0.1 % Apply 1 application topically 2 (two) times daily as needed. 30 g 3  . zolpidem (AMBIEN) 10 MG tablet TAKE ONE TABLET BY MOUTH EVERY NIGHT AT BEDTIME AS NEEDED 30 tablet 5   No current facility-administered medications for this visit.     Allergies  Allergen Reactions  . Pollen Extract Hives and Itching  . Metoprolol     Headaches and dizziness  . Mold Extract [Trichophyton]   . Percocet [Oxycodone-Acetaminophen]     itching  . Lisinopril Cough    Past Medical History:  Diagnosis  Date  . Asthma    prn inhaler and neb.  Marland Kitchen Axillary mass 07/2014   left  . CRPS (complex regional pain syndrome), lower limb    left ankle  . Hypertension    under better control since adding HCTZ  . Migraine   . Multiple allergies     Social History   Socioeconomic History  . Marital status: Married    Spouse name: Not on file  . Number of children: Not on file  . Years of education: Not on file  . Highest education level: Not on file  Social Needs  . Financial resource strain: Not on file  . Food insecurity - worry: Not on file  . Food insecurity - inability: Not on file  . Transportation needs  - medical: Not on file  . Transportation needs - non-medical: Not on file  Occupational History  . Not on file  Tobacco Use  . Smoking status: Never Smoker  . Smokeless tobacco: Never Used  Substance and Sexual Activity  . Alcohol use: Yes    Alcohol/week: 0.0 oz    Comment: occasionally  . Drug use: No  . Sexual activity: Not on file  Other Topics Concern  . Not on file  Social History Narrative  . Not on file     Family History  Problem Relation Age of Onset  . Hypertension Mother   . Diabetes Mellitus II Mother   . Hypertension Father      Review of Systems: General: negative for chills, fever, night sweats or weight changes.  Cardiovascular: negative for chest pain, dyspnea on exertion, edema, orthopnea, palpitations, paroxysmal nocturnal dyspnea or shortness of breath Dermatological: negative for rash Respiratory: negative for cough or wheezing Urologic: negative for hematuria Abdominal: negative for nausea, vomiting, diarrhea, bright red blood per rectum, melena, or hematemesis Neurologic: negative for visual changes, syncope, or dizziness All other systems reviewed and are otherwise negative except as noted above.    Blood pressure (!) 135/92, pulse 93, height 5\' 6"  (1.676 m), weight 190 lb 3.2 oz (86.3 kg).  General appearance: alert, cooperative and no distress Neck: no carotid bruit and no JVD Lungs: clear to auscultation bilaterally Heart: regular rate and rhythm Extremities: extremities normal, atraumatic, no cyanosis or edema Skin: Skin color, texture, turgor normal. No rashes or lesions Neurologic: Grossly normal   ASSESSMENT AND PLAN:   Weight gain Pt's main complaint is 20 lb wgt gain since Oct. She attributes this to Diltiazem which she stopped over a month ago.  HTN (hypertension) Repeat B/P by me 132/68  Allergic rhinitis Pt has seen a pulmonologist. She is about to finish a steroid dose pack for dry cough.    PLAN  I reviewed the pt's  office wgts going back to March 2017- she has been between 178- 185 lbs up until Oct 2018 when she weighed 189 lbs. I explained that short acting Diltiazem was unlikley to cause this. She admits she aggressively hydrates. I suggested we obtain a BNP. If this is normal I would refer her back to her PCP.  I also warned her about over hydration but she assures me she knows what she is doing as she is a former Medical sales representative.   Kerin Ransom PA-C 10/21/2017 4:18 PM

## 2017-10-21 NOTE — Assessment & Plan Note (Signed)
Pt's main complaint is 20 lb wgt gain since Oct. She attributes this to Diltiazem which she stopped over a month ago.

## 2017-10-22 ENCOUNTER — Ambulatory Visit: Payer: Self-pay

## 2017-10-22 LAB — BRAIN NATRIURETIC PEPTIDE: BNP: 13.3 pg/mL (ref 0.0–100.0)

## 2017-10-22 NOTE — Telephone Encounter (Signed)
I would be happy to send in Tessalon pearls until Dr. Sarajane Jews comes in. Hycodan is controlled.

## 2017-10-22 NOTE — Telephone Encounter (Signed)
Sent to [facility] due to PCP being gone.

## 2017-10-22 NOTE — Telephone Encounter (Signed)
Patient called in with c/o "cough not getting better." Patient was seen in the office on 10/11/17 for coughing and was prescribed prednisone and hycodan cough syrup. She says the hycodan made her sleepy, but helped suppress the cough. She ran out of the cough syrup 2-3 days ago and just finished the prednisone today.  She denies fever, productive cough-describes cough as dry, hacking, constant. Denies chest pain and SOB, but says her chest does feel tight when coughing, but no wheezing. Per protocol, she is to be seen within 3 days, she refuses the appointment to be made, but asked if the hycodan can be reordered to help with her coughing and the coughing doesn't get better, she will call back on Monday for an appointment, care advice given, she verbalized understanding.   Reason for Disposition . Cough has been present for > 3 weeks  Answer Assessment - Initial Assessment Questions 1. ONSET: "When did the cough begin?"      Over a month 2. SEVERITY: "How bad is the cough today?"      Continuous coughing during day, worse when lying down 3. RESPIRATORY DISTRESS: "Describe your breathing."      Not wheezing, coughing causes heavy breathing 4. FEVER: "Do you have a fever?" If so, ask: "What is your temperature, how was it measured, and when did it start?"     No fever 5. HEMOPTYSIS: "Are you coughing up any blood?" If so ask: "How much?" (flecks, streaks, tablespoons, etc.)     Denies 6. TREATMENT: "What have you done so far to treat the cough?" (e.g., meds, fluids, humidifier)     Prednisone, Tylenol for aches, prescription cough medicine-ran out 2 days ago, fluids, humidifier  7. CARDIAC HISTORY: "Do you have any history of heart disease?" (e.g., heart attack, congestive heart failure)      No 8. LUNG HISTORY: "Do you have any history of lung disease?"  (e.g., pulmonary embolus, asthma, emphysema)     Asthma 9. PE RISK FACTORS: "Do you have a history of blood clots?" (or: recent major surgery,  recent prolonged travel, bedridden )     NO 10. OTHER SYMPTOMS: "Do you have any other symptoms? (e.g., runny nose, wheezing, chest pain)       Chest tight from coughing, no wheezing 11. PREGNANCY: "Is there any chance you are pregnant?" "When was your last menstrual period?"       No 12. TRAVEL: "Have you traveled out of the country in the last month?" (e.g., travel history, exposures)       No  Protocols used: COUGH - ACUTE NON-PRODUCTIVE-A-AH

## 2017-10-23 ENCOUNTER — Other Ambulatory Visit: Payer: Self-pay | Admitting: Family Medicine

## 2017-10-25 NOTE — Telephone Encounter (Signed)
Last OV 10/01/2017. Last refilled 04/30/2017 disp 30 with 5 refills. Sent to PCP for approval.

## 2017-10-25 NOTE — Telephone Encounter (Signed)
Call in #[phone] with 5 rf 

## 2017-10-27 NOTE — Telephone Encounter (Signed)
Called and spoke with pt she stated that she tried the tessalon pearls but they did NOT help. Pt is aware that Dr. Sarajane Jews is not in today but will be back on Thursday. Pt would like the hycodan cough syrup refilled sent to PCP

## 2017-10-28 MED ORDER — HYDROCODONE-HOMATROPINE 5-1.5 MG/5ML PO SYRP: 5.0000 mL | ORAL_SOLUTION | ORAL | 0 refills | Status: DC | PRN

## 2017-10-28 NOTE — Telephone Encounter (Signed)
Pt advised that Rx was sent into pt's pharmacy.

## 2017-10-28 NOTE — Addendum Note (Signed)
Addended by: Alysia Penna A on: 10/28/2017 09:39 AM   Modules accepted: Orders

## 2017-10-28 NOTE — Telephone Encounter (Signed)
This was emailed in

## 2017-11-01 ENCOUNTER — Ambulatory Visit: Payer: BLUE CROSS/BLUE SHIELD | Admitting: Cardiovascular Disease

## 2017-11-10 ENCOUNTER — Encounter: Payer: Self-pay | Admitting: Family Medicine

## 2017-11-10 ENCOUNTER — Ambulatory Visit (INDEPENDENT_AMBULATORY_CARE_PROVIDER_SITE_OTHER): Payer: BLUE CROSS/BLUE SHIELD | Admitting: Family Medicine

## 2017-11-10 VITALS — BP 122/84 | HR 87 | Temp 98.4°F | Wt 188.6 lb

## 2017-11-10 DIAGNOSIS — I10 Essential (primary) hypertension: Secondary | ICD-10-CM

## 2017-11-10 DIAGNOSIS — R635 Abnormal weight gain: Secondary | ICD-10-CM | POA: Diagnosis not present

## 2017-11-10 DIAGNOSIS — G4700 Insomnia, unspecified: Secondary | ICD-10-CM | POA: Diagnosis not present

## 2017-11-10 DIAGNOSIS — R002 Palpitations: Secondary | ICD-10-CM | POA: Diagnosis not present

## 2017-11-10 MED ORDER — ATENOLOL 25 MG PO TABS: 25.0000 mg | ORAL_TABLET | Freq: Every day | ORAL | 3 refills | Status: DC

## 2017-11-10 MED ORDER — ZOLPIDEM TARTRATE ER 12.5 MG PO TBCR: 12.5000 mg | EXTENDED_RELEASE_TABLET | Freq: Every evening | ORAL | 0 refills | Status: DC | PRN

## 2017-11-10 MED ORDER — FUROSEMIDE 20 MG PO TABS: 20.0000 mg | ORAL_TABLET | Freq: Every day | ORAL | 3 refills | Status: DC

## 2017-11-10 NOTE — Progress Notes (Signed)
   Subjective:    Patient ID: Zoe Swanson, female    DOB: 03/12/1976, 42 y.o.   MRN: [id number]  HPI Here for several issues. First she saw Dr. Claiborne Billings several times last fall for palpitations and her cardiac workup was otherwise normal. He tried her on Diltiazem, both immediate release and sustained release formulations. These worked but caused here to retain fluid. Her hands and feet swelled so she stopped taking this about 3 weeks ago. Even though she takes HCTZ the swelling has remained. No SOB or chest pains. Also she has trouble with sleep. The Zolpidem she is taking helps her fall asleep but she usually wakes up during the night and cannot go back to sleep.    Review of Systems  Constitutional: Negative.   Respiratory: Negative.   Cardiovascular: Positive for palpitations and leg swelling. Negative for chest pain.  Neurological: Negative.   Psychiatric/Behavioral: Positive for sleep disturbance.       Objective:   Physical Exam  Constitutional: She is oriented to person, place, and time. She appears well-developed and well-nourished.  Neck: No thyromegaly present.  Cardiovascular: Normal rate, regular rhythm, normal heart sounds and intact distal pulses.  Pulmonary/Chest: Effort normal and breath sounds normal. No respiratory distress. She has no wheezes. She has no rales.  Lymphadenopathy:    She has no cervical adenopathy.  Neurological: She is alert and oriented to person, place, and time.  Psychiatric: She has a normal mood and affect. Her behavior is normal. Thought content normal.          Assessment & Plan:  For the palpitations, she will try Atenolol 25 mg daily. For the edema, stop HCTZ and try Lasix 20 mg daily. For the sleep, switch to Zolpidem CR 12.5 mg nightly.  Alysia Penna, MD

## 2017-11-11 ENCOUNTER — Ambulatory Visit: Payer: BLUE CROSS/BLUE SHIELD | Admitting: Family Medicine

## 2017-12-10 ENCOUNTER — Other Ambulatory Visit: Payer: Self-pay | Admitting: Family Medicine

## 2017-12-13 NOTE — Telephone Encounter (Signed)
Last OV: 11/10/17 Last refill: 11/10/17 #30 with 0 refills. Ok to refill? Please advise.

## 2017-12-13 NOTE — Telephone Encounter (Signed)
Call in #[phone] with 5 rf 

## 2017-12-15 ENCOUNTER — Other Ambulatory Visit: Payer: Self-pay

## 2017-12-15 MED ORDER — ZOLPIDEM TARTRATE ER 12.5 MG PO TBCR: EXTENDED_RELEASE_TABLET | ORAL | 5 refills | Status: DC

## 2017-12-15 NOTE — Telephone Encounter (Signed)
Called in Rx for zolpidem called into [facility]. Number listed for [facility] is incorrect should be [phone]

## 2017-12-16 DIAGNOSIS — G90522 Complex regional pain syndrome I of left lower limb: Secondary | ICD-10-CM | POA: Diagnosis not present

## 2017-12-16 DIAGNOSIS — Z9689 Presence of other specified functional implants: Secondary | ICD-10-CM | POA: Diagnosis not present

## 2017-12-16 DIAGNOSIS — G894 Chronic pain syndrome: Secondary | ICD-10-CM | POA: Diagnosis not present

## 2017-12-16 DIAGNOSIS — G8921 Chronic pain due to trauma: Secondary | ICD-10-CM | POA: Diagnosis not present

## 2017-12-17 NOTE — Telephone Encounter (Signed)
Please take care of this.  

## 2017-12-21 ENCOUNTER — Encounter: Payer: Self-pay | Admitting: Family Medicine

## 2017-12-21 ENCOUNTER — Encounter: Payer: Self-pay | Admitting: Cardiovascular Disease

## 2017-12-21 ENCOUNTER — Ambulatory Visit (INDEPENDENT_AMBULATORY_CARE_PROVIDER_SITE_OTHER): Payer: BLUE CROSS/BLUE SHIELD | Admitting: Family Medicine

## 2017-12-21 VITALS — BP 128/78 | HR 98 | Temp 97.9°F | Wt 191.2 lb

## 2017-12-21 DIAGNOSIS — S300XXA Contusion of lower back and pelvis, initial encounter: Secondary | ICD-10-CM

## 2017-12-21 NOTE — Telephone Encounter (Signed)
Per chart review, patient did not meet criteria for CPAP. Routed to MD

## 2017-12-21 NOTE — Progress Notes (Signed)
   Subjective:    Patient ID: Zoe Swanson, female    DOB: 03-07-1976, 42 y.o.   MRN: [id number]  HPI Here to check her tailbone. On 12-03-17 she missed a step and fell down, landing directly in a sitting position. Her tailbone has been very painful since then. Ibuprofen and ice help a lot.    Review of Systems  Constitutional: Negative.   Respiratory: Negative.   Cardiovascular: Negative.   Gastrointestinal: Negative.   Genitourinary: Negative.   Musculoskeletal: Positive for back pain.       Objective:   Physical Exam  Constitutional: She appears well-developed and well-nourished.  Cardiovascular: Normal rate, regular rhythm, normal heart sounds and intact distal pulses.  Pulmonary/Chest: Effort normal and breath sounds normal. No respiratory distress. She has no wheezes. She has no rales.  Musculoskeletal:  She is tender over the sacrum and the coccyx. No swelling or crepitus           Assessment & Plan:  Sacral contusion. Continue Ibuprofen and ice. Recheck prn.  Alysia Penna, MD

## 2017-12-22 DIAGNOSIS — G90522 Complex regional pain syndrome I of left lower limb: Secondary | ICD-10-CM | POA: Diagnosis not present

## 2017-12-22 DIAGNOSIS — G894 Chronic pain syndrome: Secondary | ICD-10-CM | POA: Diagnosis not present

## 2017-12-22 DIAGNOSIS — Z9689 Presence of other specified functional implants: Secondary | ICD-10-CM | POA: Diagnosis not present

## 2018-01-14 ENCOUNTER — Other Ambulatory Visit: Payer: Self-pay | Admitting: Family Medicine

## 2018-01-14 NOTE — Telephone Encounter (Signed)
Call in #60 with 5 rf 

## 2018-01-14 NOTE — Telephone Encounter (Signed)
Last OV 12/21/2017   Last refilled 07/06/2017 disp 60 with 5 refills   Sent to PCP for approval

## 2018-02-02 ENCOUNTER — Other Ambulatory Visit: Payer: Self-pay

## 2018-02-02 ENCOUNTER — Encounter (HOSPITAL_COMMUNITY): Payer: Self-pay

## 2018-02-02 ENCOUNTER — Emergency Department (HOSPITAL_COMMUNITY)
Admission: EM | Admit: 2018-02-02 | Discharge: 2018-02-02 | Disposition: A | Discharge status: Home / Self Care | Payer: BLUE CROSS/BLUE SHIELD | Attending: Emergency Medicine | Admitting: Emergency Medicine

## 2018-02-02 ENCOUNTER — Emergency Department (HOSPITAL_COMMUNITY): Payer: BLUE CROSS/BLUE SHIELD

## 2018-02-02 DIAGNOSIS — R079 Chest pain, unspecified: Secondary | ICD-10-CM | POA: Diagnosis not present

## 2018-02-02 DIAGNOSIS — L299 Pruritus, unspecified: Secondary | ICD-10-CM | POA: Diagnosis not present

## 2018-02-02 DIAGNOSIS — R002 Palpitations: Secondary | ICD-10-CM | POA: Diagnosis not present

## 2018-02-02 DIAGNOSIS — T7840XA Allergy, unspecified, initial encounter: Secondary | ICD-10-CM | POA: Insufficient documentation

## 2018-02-02 DIAGNOSIS — R069 Unspecified abnormalities of breathing: Secondary | ICD-10-CM | POA: Diagnosis not present

## 2018-02-02 DIAGNOSIS — R1084 Generalized abdominal pain: Secondary | ICD-10-CM | POA: Insufficient documentation

## 2018-02-02 DIAGNOSIS — I10 Essential (primary) hypertension: Secondary | ICD-10-CM | POA: Diagnosis not present

## 2018-02-02 DIAGNOSIS — R030 Elevated blood-pressure reading, without diagnosis of hypertension: Secondary | ICD-10-CM | POA: Diagnosis not present

## 2018-02-02 DIAGNOSIS — R110 Nausea: Secondary | ICD-10-CM | POA: Insufficient documentation

## 2018-02-02 DIAGNOSIS — R0602 Shortness of breath: Secondary | ICD-10-CM | POA: Diagnosis not present

## 2018-02-02 DIAGNOSIS — R062 Wheezing: Secondary | ICD-10-CM | POA: Diagnosis present

## 2018-02-02 LAB — I-STAT TROPONIN, ED: TROPONIN I, POC: 0 ng/mL (ref 0.00–0.08)

## 2018-02-02 LAB — CBC WITH DIFFERENTIAL/PLATELET
BASOS ABS: 0 10*3/uL (ref 0.0–0.1)
BASOS PCT: 0 %
Eosinophils Absolute: 0 10*3/uL (ref 0.0–0.7)
Eosinophils Relative: 0 %
HEMATOCRIT: 40 % (ref 36.0–46.0)
HEMOGLOBIN: 13.4 g/dL (ref 12.0–15.0)
Lymphocytes Relative: 8 %
Lymphs Abs: 0.9 10*3/uL (ref 0.7–4.0)
MCH: 27.6 pg (ref 26.0–34.0)
MCHC: 33.5 g/dL (ref 30.0–36.0)
MCV: 82.5 fL (ref 78.0–100.0)
MONO ABS: 0.1 10*3/uL (ref 0.1–1.0)
Monocytes Relative: 1 %
NEUTROS ABS: 9.7 10*3/uL — AB (ref 1.7–7.7)
NEUTROS PCT: 91 %
Platelets: 426 10*3/uL — ABNORMAL HIGH (ref 150–400)
RBC: 4.85 MIL/uL (ref 3.87–5.11)
RDW: 15.3 % (ref 11.5–15.5)
WBC: 10.7 10*3/uL — AB (ref 4.0–10.5)

## 2018-02-02 LAB — BASIC METABOLIC PANEL
Anion gap: 11 (ref 5–15)
BUN: 17 mg/dL (ref 6–20)
CHLORIDE: 106 mmol/L (ref 101–111)
CO2: 21 mmol/L — ABNORMAL LOW (ref 22–32)
CREATININE: 0.87 mg/dL (ref 0.44–1.00)
Calcium: 9.3 mg/dL (ref 8.9–10.3)
GFR calc non Af Amer: >60 mL/min (ref >60)
Glucose, Bld: 141 mg/dL — ABNORMAL HIGH (ref 65–99)
Potassium: 3.8 mmol/L (ref 3.5–5.1)
SODIUM: 138 mmol/L (ref 135–145)

## 2018-02-02 LAB — D-DIMER, QUANTITATIVE: D-Dimer, Quant: 0.27 ug/mL-FEU (ref 0.00–0.50)

## 2018-02-02 LAB — POC URINE PREG, ED: PREG TEST UR: NEGATIVE

## 2018-02-02 MED ORDER — ALBUTEROL SULFATE HFA 108 (90 BASE) MCG/ACT IN AERS
2.0000 | INHALATION_SPRAY | Freq: Four times a day (QID) | RESPIRATORY_TRACT | Status: DC | PRN
  Administered 2018-02-02: 2 via RESPIRATORY_TRACT
  Filled 2018-02-02: qty 6.7

## 2018-02-02 MED ORDER — PREDNISONE 20 MG PO TABS: 40.0000 mg | ORAL_TABLET | Freq: Every day | ORAL | 0 refills | Status: AC

## 2018-02-02 MED ORDER — EPINEPHRINE 0.3 MG/0.3ML IJ SOAJ: 0.3000 mg | Freq: Once | INTRAMUSCULAR | 1 refills | Status: AC

## 2018-02-02 MED ORDER — SODIUM CHLORIDE 0.9 % IV BOLUS
1000.0000 mL | Freq: Once | INTRAVENOUS | Status: AC
Start: 2018-02-02 — End: 2018-02-02
  Administered 2018-02-02: 1000 mL via INTRAVENOUS

## 2018-02-02 MED ORDER — SODIUM CHLORIDE 0.9 % IV SOLN: INTRAVENOUS | Status: DC

## 2018-02-02 MED ORDER — FAMOTIDINE IN NACL 20-0.9 MG/50ML-% IV SOLN
20.0000 mg | Freq: Once | INTRAVENOUS | Status: AC
Start: 2018-02-02 — End: 2018-02-02
  Administered 2018-02-02: 20 mg via INTRAVENOUS
  Filled 2018-02-02: qty 50

## 2018-02-02 MED ORDER — DIPHENHYDRAMINE HCL 50 MG/ML IJ SOLN
25.0000 mg | Freq: Once | INTRAMUSCULAR | Status: AC
  Administered 2018-02-02: 25 mg via INTRAVENOUS
  Filled 2018-02-02: qty 1

## 2018-02-02 MED ORDER — FAMOTIDINE 20 MG PO TABS: 20.0000 mg | ORAL_TABLET | Freq: Every day | ORAL | 0 refills | Status: DC

## 2018-02-02 NOTE — ED Triage Notes (Signed)
Per [facility] Pt reports at work, allergic to tree pollen. Pt vomited, itching and shortness of breath.  EPI given by pt however her EPI pen was out of date. EMS gave EPI 0.3 IM continued with Benadryl 50 mg IVP and solumedrol 125 mcg IVP. Pt responds with less itching, less of shortness of breath. Recovering without event. Pt presents talking in complete sentences. Continues to improve.

## 2018-02-02 NOTE — ED Provider Notes (Signed)
Care assumed from [facility] at shift change, please see her note for full details but in brief Zoe Swanson is a 42 y.o. 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 Pt was outside at work today and started to have her typical anaphylactic symptoms, gave herself a dose of epinephrine, and EMS gave additional epinephrine on the scene as well as Solu-Medrol.  On arrival to the ED patient's symptoms were vastly improved.  Patient was initially set up to be discharged home, reports she is continuing to have some dizziness and chest heaviness, after discussion patient has agreed to stay for lab evaluation to rule out any cardiac or pulmonary cause for these persistent symptoms.  Plan: Follow up on lab evaluation, if normal patient can be discharged home with 3-day course of steroids, and Pepcid in addition to her home Benadryl, refill for epinephrine provided as well.  Dg Chest 2 View  Result Date: 02/02/2018 CLINICAL DATA:  Chest pain, shortness of breath EXAM: CHEST - 2 VIEW COMPARISON:  10/12/2017 FINDINGS: The heart size and mediastinal contours are within normal limits. Both lungs are clear. Lower thoracic stimulator leads. IMPRESSION: No active cardiopulmonary disease. Electronically Signed   By: Donavan Foil M.D.   On: 02/02/2018 16:48    Labs Reviewed  CBC WITH DIFFERENTIAL/PLATELET - Abnormal; Notable for the following components:      Result Value   WBC 10.7 (*)    Platelets 426 (*)    Neutro Abs 9.7 (*)    All other components within normal limits  BASIC METABOLIC PANEL - Abnormal; Notable for the following components:   CO2 21 (*)    Glucose, Bld 141 (*)    All other components within normal limits  D-DIMER, QUANTITATIVE (NOT AT [facility])  POC URINE PREG, ED  I-STAT TROPONIN, ED   Gust results of patient's initial workup, she reports she still having some mild chest heaviness and discomfort although it is improving.  I discussed with the patient that I would like to get a  delta troponin at 3 hours to ensure this is not increasing.  Patient is initially in agreement with this plan request something to eat.  6:38 PM called to the bedside by nursing, patient reports she would like to leave she does not want to stay for any additional testing.  Patient has full decision-making capacity she is alert and oriented, discussed that at this time we cannot ensure that she is not having any acute cardiac or pulmonary issue without completing testing and repeating troponin, patient reports she understands this but she still wishes to leave [place].  Patient provided prescriptions for steroids, a refill of her EpiPen and Pepcid, strict return precautions discussed.   Jacqlyn Larsen, PA-C 02/02/18 1839    Fatima Blank, MD 02/07/18 2041

## 2018-02-02 NOTE — Discharge Instructions (Addendum)
Please read and follow all provided instructions.  Your diagnoses today include:  1. Allergic reaction, initial encounter     Tests performed today include: Vital signs. See below for your results today.   Medications prescribed:   Take any prescribed medications only as directed.  Please take prednisone 40 mg for 3 days.  Please take Pepcid 20 mg for 3 days.  Home care instructions:  Follow any educational materials contained in this packet  Follow-up instructions: Please follow-up with your primary care provider in the next 3 days for further evaluation of your symptoms.   Return instructions:  Please return to the Emergency Department if you experience worsening symptoms.  Call 9-1-1 immediately if you have an allergic reaction that involves your lips, mouth, throat or if you have any difficulty breathing. This is a life-threatening emergency.  Please return if you have any other emergent concerns.  Additional Information:  Your vital signs today were: BP (!) 142/91    Pulse 78    Temp 98.8 F (37.1 C) (Oral)    Resp 15    Ht 5' 5.5" (1.664 m)    Wt 83.9 kg (185 lb)    LMP 01/19/2018    SpO2 97%    BMI 30.32 kg/m  If your blood pressure (BP) was elevated above 130/80 this visit, please have this repeated by your doctor within one month. --------------  Thank you for allowing [place] to participate in your care today!

## 2018-02-02 NOTE — ED Provider Notes (Addendum)
[facility] DEPT Provider Note   CSN: [id number] Arrival date & time: 02/02/18  1205     History   Chief Complaint Chief Complaint  Patient presents with  . Allergic Reaction    HPI Zoe Swanson is a 42 y.o. female.  HPI  Patient is a 42 year old female with a history of asthma, hypertension, complex regional pain syndrome, and anaphylaxis presenting for throat itching, wheezing, abdominal cramping, and itching.  Patient reports that this is consistent with her previous anaphylaxis.  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  Patient reports that she cannot identify the exact moment of the precipitant of her symptoms, but reports she felt itching in her throat and the need to cough.  Patient sat down and when the subsequently did not improve her shortness of breath, patient self administered epinephrine.  Patient administered a second dose of epinephrine and EMS as well as received Solu-Medrol and Benadryl IV.  Patient denies chest pain, subsequent shortness of breath, urticaria, but does report she has persistent sensation of itching on the soles of her feet.  Past Medical History:  Diagnosis Date  . Asthma    prn inhaler and neb.  Marland Kitchen Axillary mass 07/2014   left  . CRPS (complex regional pain syndrome), lower limb    left ankle  . Hypertension    under better control since adding HCTZ  . Migraine   . Multiple allergies     Patient Active Problem List   Diagnosis Date Noted  . Palpitations 11/10/2017  . Insomnia 11/10/2017  . Weight gain 10/21/2017  . Atrial mass   . HTN (hypertension) 06/18/2014  . GENERALIZED ANXIETY DISORDER 03/23/2008  . Allergic rhinitis 03/15/2008  . ASTHMA, EXTRINSIC W/ACUTE EXACERBATION 03/14/2008  . ASTHMA 08/23/2007    Past Surgical History:  Procedure Laterality Date  . ANKLE ARTHROTOMY  Left 06/02/2011   removal of loose body; anterolateral ligament repair  . BREAST BIOPSY  2008  . BREAST LUMPECTOMY W/ NEEDLE LOCALIZATION Left 12/20/2006  . BREAST LUMPECTOMY WITH RADIOACTIVE SEED LOCALIZATION Left 08/24/2014   Procedure: LEFT BREAST LUMPECTOMY WITH RADIOACTIVE SEED LOCALIZATION;  Surgeon: Fanny Skates, MD;  Location: [facility];  Service: General;  Laterality: Left;  . CESAREAN SECTION  06/30/2009  . KNEE ARTHROSCOPY Left 1995  . SPINAL CORD STIMULATOR IMPLANT Right 07/20/2013   buttock  . TEE WITHOUT CARDIOVERSION N/A 05/20/2017   Procedure: TRANSESOPHAGEAL ECHOCARDIOGRAM (TEE);  Surgeon: Sueanne Margarita, MD;  Location: [facility] ENDOSCOPY;  Service: Cardiovascular;  Laterality: N/A;  . TUBAL LIGATION       OB History    Gravida  1   Para      Term      Preterm      AB      Living        SAB      TAB      Ectopic      Multiple      Live Births               Home Medications    Prior to Admission medications   Medication Sig Start Date End Date Taking? Authorizing Provider  albuterol (PROVENTIL) (2.5 MG/3ML) 0.083% nebulizer solution Take 2.5 mg by nebulization every 4 (four) hours as needed for wheezing or shortness of breath (asthma).    Yes  [provider]  albuterol (VENTOLIN HFA) 108 (90 BASE) MCG/ACT inhaler Inhale 2 puffs into the lungs every 6 (six) hours as needed for wheezing or shortness of breath (ashtma).    Yes [provider]  ALPRAZolam Duanne Moron) 1 MG tablet TAKE ONE TABLET BY MOUTH TWICE A DAY AS NEEDED 01/17/18  Yes Laurey Morale, MD  atenolol (TENORMIN) 25 MG tablet Take 1 tablet (25 mg total) by mouth daily. 11/10/17  Yes Laurey Morale, MD  diphenhydrAMINE (BENADRYL) 25 MG tablet Take 25 mg by mouth every 6 (six) hours as needed for allergies (allergic reaction).   Yes [provider]  furosemide (LASIX) 20 MG tablet Take 1 tablet (20 mg total) by mouth daily. 11/10/17  Yes Laurey Morale, MD    Ipratropium-Albuterol (COMBIVENT) 20-100 MCG/ACT AERS respimat Inhale 1 puff into the lungs every 6 (six) hours as needed for wheezing.   Yes [provider]  montelukast (SINGULAIR) 10 MG tablet Take 10 mg by mouth at bedtime.   Yes [provider]  triamcinolone cream (KENALOG) 0.1 % Apply 1 application topically 2 (two) times daily as needed. 04/23/17  Yes Burchette, Alinda Sierras, MD  zolpidem (AMBIEN) 5 MG tablet Take 5 mg by mouth at bedtime as needed for sleep.    Yes [provider]  EPINEPHrine 0.3 mg/0.3 mL IJ SOAJ injection Inject 0.3 mLs (0.3 mg total) into the muscle once for 1 dose. Reported on 01/24/2016 02/02/18 02/02/18  Langston Masker B, PA-C  famotidine (PEPCID) 20 MG tablet Take 1 tablet (20 mg total) by mouth daily. 02/02/18   Langston Masker B, PA-C  HYDROcodone-homatropine (HYDROMET) 5-1.5 MG/5ML syrup Take 5 mLs by mouth every 4 (four) hours as needed. Patient not taking: Reported on 02/02/2018 10/28/17   Laurey Morale, MD  meclizine (ANTIVERT) 25 MG tablet Take 1 tablet (25 mg total) by mouth daily as needed for dizziness. Patient not taking: Reported on 02/02/2018 05/04/17   Almyra Deforest, PA  nitroGLYCERIN (NITROSTAT) 0.4 MG SL tablet Place 1 tablet (0.4 mg total) under the tongue every 5 (five) minutes as needed for chest pain. MAX 3 doses 07/29/17 02/02/18  Almyra Deforest, PA  predniSONE (DELTASONE) 20 MG tablet Take 2 tablets (40 mg total) by mouth daily for 3 days. 02/02/18 02/05/18  Langston Masker B, PA-C  zolpidem (AMBIEN CR) 12.5 MG CR tablet TAKE ONE TABLET BY MOUTH EVERY NIGHT AT BEDTIME AS NEEDED FOR SLEEP Patient not taking: Reported on 02/02/2018 12/15/17   Laurey Morale, MD    Family History Family History  Problem Relation Age of Onset  . Hypertension Mother   . Diabetes Mellitus II Mother   . Hypertension Father     Social History Social History   Tobacco Use  . Smoking status: Never Smoker  . Smokeless tobacco: Never Used  Substance Use Topics   . Alcohol use: Yes    Alcohol/week: 0.0 oz    Comment: occasionally  . Drug use: No     Allergies   Pollen extract; Metoprolol; Mold extract [trichophyton]; Percocet [oxycodone-acetaminophen]; and Lisinopril   Review of Systems Review of Systems  Constitutional: Negative for chills and fever.  HENT: Negative for congestion and rhinorrhea.   Respiratory: Positive for cough and wheezing. Negative for chest tightness and shortness of breath.   Cardiovascular: Positive for palpitations and leg swelling.  Gastrointestinal: Positive for abdominal pain, diarrhea and nausea. Negative for vomiting.  Genitourinary: Negative for dysuria.  Musculoskeletal: Negative for back pain.  Skin: Positive for rash. Negative for color change.  Allergic/Immunologic: Positive for environmental allergies.  Neurological: Negative for dizziness and light-headedness.     Physical Exam Updated Vital Signs BP 135/88 (BP Location: Right Arm)   Pulse 69   Temp 98.3 F (36.8 C) (Oral)   Resp 16   Ht 5' 5.5" (1.664 m)   Wt 83.9 kg (185 lb)   LMP 01/19/2018   SpO2 99%   BMI 30.32 kg/m   Physical Exam  Constitutional: She appears well-developed and well-nourished. No distress.  HENT:  Head: Normocephalic and atraumatic.  Mouth/Throat: Oropharynx is clear and moist.  Oropharynx clear with no evidence of soft tissue swelling.  Eyes: Pupils are equal, round, and reactive to light. Conjunctivae and EOM are normal.  Neck: Normal range of motion. Neck supple.  Cardiovascular: Normal rate, regular rhythm, S1 normal and S2 normal.  No murmur heard. Pulmonary/Chest: Effort normal and breath sounds normal. She has no wheezes. She has no rales.  Abdominal: Soft. She exhibits no distension. There is no guarding.  Mild discomfort to palpation of the central abdomen.  Musculoskeletal: Normal range of motion. She exhibits no edema or deformity.  Lymphadenopathy:    She has no cervical adenopathy.   Neurological: She is alert.  Cranial nerves grossly intact. Patient moves extremities symmetrically and with good coordination.  Skin: Skin is warm and dry. No rash noted. No erythema.  Psychiatric: She has a normal mood and affect. Her behavior is normal. Judgment and thought content normal.  Nursing note and vitals reviewed.    ED Treatments / Results  Labs (all labs ordered are listed, but only abnormal results are displayed) Labs Reviewed  CBC WITH DIFFERENTIAL/PLATELET  D-DIMER, QUANTITATIVE (NOT AT [facility])  BASIC METABOLIC PANEL  POC URINE PREG, ED  I-STAT TROPONIN, ED    EKG None  Radiology No results found.  Procedures Procedures (including critical care time)  Medications Ordered in ED Medications  sodium chloride 0.9 % bolus 1,000 mL (0 mLs Intravenous Stopped 02/02/18 1442)    And  0.9 %  sodium chloride infusion ( Intravenous Not Given 02/02/18 1541)  albuterol (PROVENTIL HFA;VENTOLIN HFA) 108 (90 Base) MCG/ACT inhaler 2 puff (2 puffs Inhalation Given 02/02/18 1543)  diphenhydrAMINE (BENADRYL) injection 25 mg (has no administration in time range)  famotidine (PEPCID) IVPB 20 mg premix (0 mg Intravenous Stopped 02/02/18 1413)     Initial Impression / Assessment and Plan / ED Course  I have reviewed the triage vital signs and the nursing notes.  Pertinent labs & imaging results that were available during my care of the patient were reviewed by me and considered in my medical decision making (see chart for details).  Clinical Course as of Feb 03 1739  Wed Feb 02, 2018  1607 I was called to evaluate the patient at bedside. Patient reported that she would like to be discharged, and declined any laboratory workup during her visit.  Subsequently, patient reports that she went up to stand, had felt a heaviness in her chest, and felt that she was dizzy.  Patient reports that she did not make note of these symptoms previously, feeling that she could attribute them to her  allergic reaction.  Patient discussed that she would still like to leave, and I had a risk-benefit discussion with the patient, and her husband encouraged her to stay to receive cardiopulmonary evaluation.   [AM]    Clinical Course User Index [AM] Albesa Seen, PA-C   On initial  evaluation, patient nontoxic, non-tachycardic, normotensive, and not hypoxic.  Patient ambulated without difficulty in the emergency department.  Patient with good response to typical ED anaphylaxis treatments.  On multiple re-evaluations, patient reporting that she feels well, and feels that she can go home, rest and is requesting rapid discharge. Patient reluctant to wait 3 hour interval after epinephrine administration.  Due to patient's reported change in clinical course, further workup initiated and patient in full understanding that leaving would be at risk of not ruling out serious cardiopulmonary conditions.  Additional 25 mg dose of Benadryl ordered for patient.  Patient care signed out to Benedetto Goad, PA-C at 4:15 PM to follow further workup.   If workup negative for acute cardiopulmonary condition, patient to be discharged with prednisone, pepcid, home benadryl, and epinephrine refill.   Final Clinical Impressions(s) / ED Diagnoses   Final diagnoses:  Allergic reaction, initial encounter  Elevated blood pressure reading with diagnosis of hypertension      Albesa Seen, PA-C 02/02/18 1624    Albesa Seen, PA-C 02/02/18 1740    Drenda Freeze, MD 02/07/18 [phone]

## 2018-02-02 NOTE — ED Notes (Signed)
EDPA Provider at bedside. KELSEY PA AT BEDSIDE. PT CAN HAVE DISCHARGE INSTRUCTIONS AS BEFORE. PT DECLINES TO STAY FOR FURTHER TESTING. RISKS AND BENEFITS DISCUSSED. DISCHARGE PAPERS WITH RX X 3 AND INHALER GIVEN. PT WILL SIGN OUT AMA. PT AT PRESENT REPORT CHEST HEAVINESS HOWEVER NOT AS BAD AS BEFORE. DENIES SHORTNESS OF BREATH.

## 2018-02-02 NOTE — ED Notes (Signed)
Bed: ID03 Expected date:  Expected time:  Means of arrival:  Comments: EMS/allergic RXN

## 2018-02-02 NOTE — ED Notes (Signed)
PT CONTINUES TO C/O OF COUGHING. SHE C/O CHEST PRESSURE, FATIGUE AND PALPITATIONS. "THIS REACTION IS DIFFERENT THAN ONES BEFORE" EDPA ALYSSA EVALUATES PT AT BEDSIDE. EDP RECOMMENDS FURTHER EVALUATIONS, EKG, LABS AND ADDITIONAL TESTING. REQUESTS PT TO STAY. FAMILY PRESENT AND UNDERSTANDS REASON FOR FURTHER EVALUATION.

## 2018-02-02 NOTE — ED Notes (Signed)
EDPA Provider at bedside. [place] [facility]

## 2018-02-15 ENCOUNTER — Encounter: Payer: Self-pay | Admitting: Family Medicine

## 2018-02-15 ENCOUNTER — Ambulatory Visit (INDEPENDENT_AMBULATORY_CARE_PROVIDER_SITE_OTHER): Payer: BLUE CROSS/BLUE SHIELD | Admitting: Family Medicine

## 2018-02-15 VITALS — BP 116/68 | HR 79 | Temp 98.6°F | Ht 65.5 in | Wt 188.6 lb

## 2018-02-15 DIAGNOSIS — J3089 Other allergic rhinitis: Secondary | ICD-10-CM

## 2018-02-15 DIAGNOSIS — J4521 Mild intermittent asthma with (acute) exacerbation: Secondary | ICD-10-CM | POA: Diagnosis not present

## 2018-02-15 MED ORDER — ALBUTEROL SULFATE (2.5 MG/3ML) 0.083% IN NEBU: 2.5000 mg | INHALATION_SOLUTION | RESPIRATORY_TRACT | 5 refills | Status: DC | PRN

## 2018-02-15 MED ORDER — TRIAMCINOLONE ACETONIDE 0.1 % EX CREA: 1.0000 "application " | TOPICAL_CREAM | Freq: Two times a day (BID) | CUTANEOUS | 5 refills | Status: DC | PRN

## 2018-02-15 MED ORDER — EPINEPHRINE 0.3 MG/0.3ML IJ SOAJ: 0.3000 mg | Freq: Once | INTRAMUSCULAR | 5 refills | Status: AC

## 2018-02-15 MED ORDER — ZOLPIDEM TARTRATE 10 MG PO TABS: 10.0000 mg | ORAL_TABLET | Freq: Every evening | ORAL | 5 refills | Status: DC | PRN

## 2018-02-15 MED ORDER — PREDNISONE 10 MG PO TABS: 20.0000 mg | ORAL_TABLET | Freq: Every day | ORAL | 0 refills | Status: DC

## 2018-02-15 MED ORDER — ALBUTEROL SULFATE HFA 108 (90 BASE) MCG/ACT IN AERS: 2.0000 | INHALATION_SPRAY | Freq: Four times a day (QID) | RESPIRATORY_TRACT | 11 refills | Status: DC | PRN

## 2018-02-15 NOTE — Progress Notes (Signed)
   Subjective:    Patient ID: Zoe Swanson, female    DOB: [birth year], 42 y.o.   MRN: [id number]  HPI Here to follow up an ER visit on 02-02-18 for an allergic reaction, presumably to the pollen outdoors. She developed diffuse itching, throat itching, SOB, and abdominal cramps. 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 They administered IV Solumedrol and Benadryl. Normally she takes Benadryl daily but she skipped taking it that morning. Otherwise she was stable in the ER with normal labs. Since then she has done fairly well. Fortunately the outdoor track and field season is over.    Review of Systems  Constitutional: Negative.   Respiratory: Positive for cough, chest tightness, shortness of breath and wheezing.   Cardiovascular: Negative.   Neurological: Negative.        Objective:   Physical Exam  Constitutional: She is oriented to person, place, and time. She appears well-developed and well-nourished.  Cardiovascular: Normal rate, regular rhythm, normal heart sounds and intact distal pulses.  Pulmonary/Chest: Effort normal and breath sounds normal. No respiratory distress. She has no wheezes. She has no rales.  Neurological: She is alert and oriented to person, place, and time.  Skin: No rash noted.          Assessment & Plan:  Her environmental allergies are under better control now. We will put her on Prednisone for another week. Recheck prn.  Alysia Penna, MD

## 2018-03-10 DIAGNOSIS — G90522 Complex regional pain syndrome I of left lower limb: Secondary | ICD-10-CM | POA: Diagnosis not present

## 2018-03-10 DIAGNOSIS — Z9689 Presence of other specified functional implants: Secondary | ICD-10-CM | POA: Diagnosis not present

## 2018-03-10 DIAGNOSIS — G894 Chronic pain syndrome: Secondary | ICD-10-CM | POA: Diagnosis not present

## 2018-03-10 DIAGNOSIS — M5416 Radiculopathy, lumbar region: Secondary | ICD-10-CM | POA: Diagnosis not present

## 2018-03-15 DIAGNOSIS — Z885 Allergy status to narcotic agent status: Secondary | ICD-10-CM | POA: Diagnosis not present

## 2018-03-15 DIAGNOSIS — F419 Anxiety disorder, unspecified: Secondary | ICD-10-CM | POA: Diagnosis not present

## 2018-03-15 DIAGNOSIS — M5416 Radiculopathy, lumbar region: Secondary | ICD-10-CM | POA: Diagnosis not present

## 2018-03-15 DIAGNOSIS — Z853 Personal history of malignant neoplasm of breast: Secondary | ICD-10-CM | POA: Diagnosis not present

## 2018-03-15 DIAGNOSIS — I10 Essential (primary) hypertension: Secondary | ICD-10-CM | POA: Diagnosis not present

## 2018-03-15 DIAGNOSIS — Z91048 Other nonmedicinal substance allergy status: Secondary | ICD-10-CM | POA: Diagnosis not present

## 2018-03-15 DIAGNOSIS — Z888 Allergy status to other drugs, medicaments and biological substances status: Secondary | ICD-10-CM | POA: Diagnosis not present

## 2018-03-15 DIAGNOSIS — Z79899 Other long term (current) drug therapy: Secondary | ICD-10-CM | POA: Diagnosis not present

## 2018-03-15 DIAGNOSIS — M5417 Radiculopathy, lumbosacral region: Secondary | ICD-10-CM | POA: Diagnosis not present

## 2018-04-14 ENCOUNTER — Telehealth: Payer: Self-pay | Admitting: *Deleted

## 2018-04-14 NOTE — Telephone Encounter (Signed)
Faxed ROI to [facility], attn: Dalbert Batman NP; release  [phone]

## 2018-04-18 ENCOUNTER — Encounter: Payer: Self-pay | Admitting: Adult Health

## 2018-04-20 DIAGNOSIS — T85890A Other specified complication of nervous system prosthetic devices, implants and grafts, initial encounter: Secondary | ICD-10-CM | POA: Diagnosis not present

## 2018-04-20 DIAGNOSIS — G4700 Insomnia, unspecified: Secondary | ICD-10-CM | POA: Diagnosis not present

## 2018-04-20 DIAGNOSIS — G90522 Complex regional pain syndrome I of left lower limb: Secondary | ICD-10-CM | POA: Diagnosis not present

## 2018-04-20 DIAGNOSIS — I10 Essential (primary) hypertension: Secondary | ICD-10-CM | POA: Diagnosis not present

## 2018-04-20 DIAGNOSIS — T85192A Other mechanical complication of implanted electronic neurostimulator (electrode) of spinal cord, initial encounter: Secondary | ICD-10-CM | POA: Diagnosis not present

## 2018-04-20 DIAGNOSIS — G90521 Complex regional pain syndrome I of right lower limb: Secondary | ICD-10-CM | POA: Diagnosis not present

## 2018-04-20 DIAGNOSIS — F419 Anxiety disorder, unspecified: Secondary | ICD-10-CM | POA: Diagnosis not present

## 2018-04-20 DIAGNOSIS — G9050 Complex regional pain syndrome I, unspecified: Secondary | ICD-10-CM | POA: Diagnosis not present

## 2018-04-20 DIAGNOSIS — J45909 Unspecified asthma, uncomplicated: Secondary | ICD-10-CM | POA: Diagnosis not present

## 2018-04-20 DIAGNOSIS — R002 Palpitations: Secondary | ICD-10-CM | POA: Diagnosis not present

## 2018-04-27 DIAGNOSIS — Z9689 Presence of other specified functional implants: Secondary | ICD-10-CM | POA: Diagnosis not present

## 2018-04-27 DIAGNOSIS — M5416 Radiculopathy, lumbar region: Secondary | ICD-10-CM | POA: Diagnosis not present

## 2018-04-27 DIAGNOSIS — M549 Dorsalgia, unspecified: Secondary | ICD-10-CM | POA: Diagnosis not present

## 2018-04-27 DIAGNOSIS — G894 Chronic pain syndrome: Secondary | ICD-10-CM | POA: Diagnosis not present

## 2018-05-06 ENCOUNTER — Other Ambulatory Visit: Payer: Self-pay | Admitting: Family Medicine

## 2018-05-18 DIAGNOSIS — M5416 Radiculopathy, lumbar region: Secondary | ICD-10-CM | POA: Diagnosis not present

## 2018-05-18 DIAGNOSIS — G90522 Complex regional pain syndrome I of left lower limb: Secondary | ICD-10-CM | POA: Diagnosis not present

## 2018-05-18 DIAGNOSIS — Z9689 Presence of other specified functional implants: Secondary | ICD-10-CM | POA: Diagnosis not present

## 2018-05-18 DIAGNOSIS — G894 Chronic pain syndrome: Secondary | ICD-10-CM | POA: Diagnosis not present

## 2018-06-01 ENCOUNTER — Encounter: Payer: Self-pay | Admitting: Family Medicine

## 2018-06-01 ENCOUNTER — Ambulatory Visit (INDEPENDENT_AMBULATORY_CARE_PROVIDER_SITE_OTHER): Payer: BLUE CROSS/BLUE SHIELD | Admitting: Family Medicine

## 2018-06-01 VITALS — BP 138/80 | HR 102 | Temp 98.3°F | Ht 65.5 in | Wt 195.0 lb

## 2018-06-01 DIAGNOSIS — I10 Essential (primary) hypertension: Secondary | ICD-10-CM

## 2018-06-01 DIAGNOSIS — R635 Abnormal weight gain: Secondary | ICD-10-CM

## 2018-06-01 DIAGNOSIS — F411 Generalized anxiety disorder: Secondary | ICD-10-CM

## 2018-06-01 DIAGNOSIS — M5416 Radiculopathy, lumbar region: Secondary | ICD-10-CM | POA: Diagnosis not present

## 2018-06-01 MED ORDER — ALPRAZOLAM 1 MG PO TABS: 1.0000 mg | ORAL_TABLET | Freq: Four times a day (QID) | ORAL | 2 refills | Status: DC | PRN

## 2018-06-01 MED ORDER — PHENTERMINE HCL 30 MG PO CAPS: 30.0000 mg | ORAL_CAPSULE | ORAL | 1 refills | Status: DC

## 2018-06-01 NOTE — Progress Notes (Signed)
   Subjective:    Patient ID: MANPREET KEMMER, female    DOB: 1976/04/04, 42 y.o.   MRN: [id number]  HPI Here for a few issues. First she is frustrated by her inability to lose weight. She is exercising and watching her diet, and she has even been working with a nutritionist. She asks if we can help. Also after the placement of her spinal stimulator they have had some difficulty adjusting the settings and she has been dealing with a lot more back pain than usual. She does not want to go back on narcotic meds, so she asks if we can adjust her Xanax instead. The pain makes her very anxious and irritable. She is currently taking 1 mg bid.    Review of Systems  Constitutional: Negative.   Respiratory: Negative.   Cardiovascular: Negative.   Musculoskeletal: Positive for back pain.  Neurological: Negative.   Psychiatric/Behavioral: Negative for dysphoric mood. The patient is nervous/anxious.        Objective:   Physical Exam  Constitutional: She is oriented to person, place, and time. She appears well-developed and well-nourished.  Cardiovascular: Normal rate, regular rhythm, normal heart sounds and intact distal pulses.  Pulmonary/Chest: Effort normal and breath sounds normal.  Neurological: She is alert and oriented to person, place, and time.          Assessment & Plan:  To assist with weight management, she will try Phentermine 30 mg daily. For the pain related anxiety, she will increase the Xanax 1 mg to QID and we hope this will be temporary.  Alysia Penna, MD

## 2018-07-03 ENCOUNTER — Other Ambulatory Visit: Payer: Self-pay | Admitting: Family Medicine

## 2018-07-04 NOTE — Telephone Encounter (Signed)
Last OV 06/01/2018   Last refilled 02/15/2018 disp 30 with 5 refills   Sent to PCP to advise

## 2018-07-30 ENCOUNTER — Encounter: Payer: Self-pay | Admitting: Family Medicine

## 2018-08-01 NOTE — Telephone Encounter (Signed)
Dr. Fry please advise. Thanks  

## 2018-08-02 ENCOUNTER — Encounter: Payer: Self-pay | Admitting: Family Medicine

## 2018-08-02 NOTE — Telephone Encounter (Signed)
She already has refills to last until February

## 2018-08-03 ENCOUNTER — Encounter: Payer: Self-pay | Admitting: Family Medicine

## 2018-08-04 DIAGNOSIS — Z6830 Body mass index (BMI) 30.0-30.9, adult: Secondary | ICD-10-CM | POA: Diagnosis not present

## 2018-08-04 DIAGNOSIS — Z1231 Encounter for screening mammogram for malignant neoplasm of breast: Secondary | ICD-10-CM | POA: Diagnosis not present

## 2018-08-04 DIAGNOSIS — R635 Abnormal weight gain: Secondary | ICD-10-CM | POA: Diagnosis not present

## 2018-08-04 DIAGNOSIS — Z803 Family history of malignant neoplasm of breast: Secondary | ICD-10-CM | POA: Diagnosis not present

## 2018-08-04 DIAGNOSIS — Z800 Family history of malignant neoplasm of digestive organs: Secondary | ICD-10-CM | POA: Diagnosis not present

## 2018-08-04 DIAGNOSIS — Z808 Family history of malignant neoplasm of other organs or systems: Secondary | ICD-10-CM | POA: Diagnosis not present

## 2018-08-04 DIAGNOSIS — Z01419 Encounter for gynecological examination (general) (routine) without abnormal findings: Secondary | ICD-10-CM | POA: Diagnosis not present

## 2018-08-04 DIAGNOSIS — R5383 Other fatigue: Secondary | ICD-10-CM | POA: Diagnosis not present

## 2018-08-04 DIAGNOSIS — Z806 Family history of leukemia: Secondary | ICD-10-CM | POA: Diagnosis not present

## 2018-08-18 ENCOUNTER — Other Ambulatory Visit: Payer: Self-pay | Admitting: Family Medicine

## 2018-08-23 NOTE — Telephone Encounter (Signed)
Pt is out of medications.

## 2018-08-24 NOTE — Telephone Encounter (Signed)
Dr. Sarajane Jews please advise on refill.  Last seen on 05/2018 by you and last refill was 01/2018 with refills.  Thanks

## 2018-08-26 NOTE — Telephone Encounter (Signed)
Patient called to check status of these medications-she is completely out.

## 2018-08-29 NOTE — Telephone Encounter (Signed)
Dr. Fry please advise on refills. Thanks  

## 2018-08-29 NOTE — Telephone Encounter (Signed)
Patient is calling back and stating that she still has not received her med refills, patient is requesting a call back today at her work number if in the next 1.5 hours @ [phone]

## 2018-08-30 NOTE — Telephone Encounter (Signed)
Pt came into the office today to see why her meds have not been sent to the pharmacy.  These were called in today for her and she will pick these up later today.

## 2018-09-29 DIAGNOSIS — Z809 Family history of malignant neoplasm, unspecified: Secondary | ICD-10-CM | POA: Diagnosis not present

## 2018-10-12 ENCOUNTER — Ambulatory Visit: Payer: BLUE CROSS/BLUE SHIELD | Admitting: Family Medicine

## 2018-10-14 ENCOUNTER — Encounter: Payer: Self-pay | Admitting: Family Medicine

## 2018-10-14 ENCOUNTER — Ambulatory Visit (INDEPENDENT_AMBULATORY_CARE_PROVIDER_SITE_OTHER): Payer: BLUE CROSS/BLUE SHIELD | Admitting: Family Medicine

## 2018-10-14 VITALS — BP 130/70 | HR 95 | Temp 98.0°F | Wt 171.0 lb

## 2018-10-14 DIAGNOSIS — Z23 Encounter for immunization: Secondary | ICD-10-CM

## 2018-10-14 DIAGNOSIS — J4540 Moderate persistent asthma, uncomplicated: Secondary | ICD-10-CM | POA: Diagnosis not present

## 2018-10-14 DIAGNOSIS — I10 Essential (primary) hypertension: Secondary | ICD-10-CM | POA: Diagnosis not present

## 2018-10-14 DIAGNOSIS — R635 Abnormal weight gain: Secondary | ICD-10-CM | POA: Diagnosis not present

## 2018-10-14 MED ORDER — PHENTERMINE HCL 30 MG PO CAPS: 30.0000 mg | ORAL_CAPSULE | ORAL | 1 refills | Status: DC

## 2018-10-14 MED ORDER — EPINEPHRINE 0.3 MG/0.3ML IJ SOAJ: 0.3000 mg | Freq: Once | INTRAMUSCULAR | 5 refills | Status: DC

## 2018-10-14 MED ORDER — IPRATROPIUM-ALBUTEROL 20-100 MCG/ACT IN AERS: 1.0000 | INHALATION_SPRAY | Freq: Four times a day (QID) | RESPIRATORY_TRACT | 11 refills | Status: DC | PRN

## 2018-10-14 NOTE — Progress Notes (Signed)
   Subjective:    Patient ID: KATELEE SCHUPP, female    DOB: 1975-12-16, 42 y.o.   MRN: [id number]  HPI Here to follow up. She has been doing very well. She has lost 19 lbs since August and she feels better. The Phentermine has helped a lot. Her asthma has been stable.    Review of Systems  Constitutional: Negative.   Respiratory: Negative.   Cardiovascular: Negative.   Neurological: Negative.        Objective:   Physical Exam Constitutional:      Appearance: Normal appearance.  Cardiovascular:     Rate and Rhythm: Normal rate and regular rhythm.     Pulses: Normal pulses.     Heart sounds: Normal heart sounds.  Pulmonary:     Effort: Pulmonary effort is normal.     Breath sounds: Normal breath sounds.  Neurological:     General: No focal deficit present.     Mental Status: She is alert and oriented to person, place, and time.           Assessment & Plan:  Her asthma has been stable. Her HTN is well controlled off medications (she has stopped taking HCTZ and Atenolol). We will refill Phentermine for another 6 months to assist with weight management.  Alysia Penna, MD

## 2018-10-28 ENCOUNTER — Other Ambulatory Visit: Payer: Self-pay | Admitting: Family Medicine

## 2018-10-28 NOTE — Telephone Encounter (Signed)
Call in #[phone] with 5 rf 

## 2018-10-28 NOTE — Telephone Encounter (Signed)
Dr. Fry please advise of refill. Thanks 

## 2018-11-03 ENCOUNTER — Telehealth: Payer: Self-pay

## 2018-11-03 NOTE — Telephone Encounter (Signed)
Copied from [place] [id number]. Topic: Quick Communication - Rx Refill/Question >> Nov 01, 2018  1:48 PM Scherrie Gerlach wrote: Medication: phentermine 30 MG capsule  Raquel Sarna, with [employer] calling to ask for more clinical info for this med. Raquel Sarna will refax, if you have done please disregard. Form, complete and send back or call [phone]  Case number: [id number]

## 2018-11-03 NOTE — Telephone Encounter (Signed)
PA sent to cover my meds.  Key: [id number] - Rx #: [id number]

## 2018-11-03 NOTE — Telephone Encounter (Signed)
Received first fax today.

## 2018-11-03 NOTE — Telephone Encounter (Signed)
rx has been called to the pharmacy for refills and left on the VM

## 2018-11-03 NOTE — Telephone Encounter (Signed)
PA for Phentermine sent to Cover My Meds.  Key: [id number] - Rx #: [id number]

## 2018-11-04 NOTE — Telephone Encounter (Signed)
Form for the PA that needed more Information sent in .  Placed in the red folder for Dr. Sarajane Jews to complete this.

## 2018-11-08 NOTE — Telephone Encounter (Signed)
PA has been approved

## 2018-11-08 NOTE — Telephone Encounter (Signed)
I called the pharmacy and they are aware of the approval.  They stated that the pt picked up #90 and the insurance will only cover this for 30 at a time.  They will make a note for when the pt comes back to get her refill in 90 days.

## 2018-11-09 NOTE — Telephone Encounter (Signed)
PA for the phentermine 30 mg has been approved.  The member is limitied to 12 weeks of therapy per year.

## 2018-11-29 DIAGNOSIS — G4700 Insomnia, unspecified: Secondary | ICD-10-CM | POA: Diagnosis not present

## 2018-11-29 DIAGNOSIS — N6310 Unspecified lump in the right breast, unspecified quadrant: Secondary | ICD-10-CM | POA: Diagnosis not present

## 2018-12-01 DIAGNOSIS — G90522 Complex regional pain syndrome I of left lower limb: Secondary | ICD-10-CM | POA: Diagnosis not present

## 2018-12-01 DIAGNOSIS — M549 Dorsalgia, unspecified: Secondary | ICD-10-CM | POA: Diagnosis not present

## 2018-12-01 DIAGNOSIS — M5416 Radiculopathy, lumbar region: Secondary | ICD-10-CM | POA: Diagnosis not present

## 2018-12-01 DIAGNOSIS — G894 Chronic pain syndrome: Secondary | ICD-10-CM | POA: Diagnosis not present

## 2018-12-09 DIAGNOSIS — F411 Generalized anxiety disorder: Secondary | ICD-10-CM | POA: Diagnosis not present

## 2018-12-09 DIAGNOSIS — G894 Chronic pain syndrome: Secondary | ICD-10-CM | POA: Diagnosis not present

## 2018-12-09 DIAGNOSIS — G4700 Insomnia, unspecified: Secondary | ICD-10-CM | POA: Diagnosis not present

## 2018-12-09 DIAGNOSIS — J45909 Unspecified asthma, uncomplicated: Secondary | ICD-10-CM | POA: Diagnosis not present

## 2018-12-09 DIAGNOSIS — G90522 Complex regional pain syndrome I of left lower limb: Secondary | ICD-10-CM | POA: Diagnosis not present

## 2018-12-09 DIAGNOSIS — Z79899 Other long term (current) drug therapy: Secondary | ICD-10-CM | POA: Diagnosis not present

## 2018-12-09 DIAGNOSIS — Z4542 Encounter for adjustment and management of neuropacemaker (brain) (peripheral nerve) (spinal cord): Secondary | ICD-10-CM | POA: Diagnosis not present

## 2018-12-09 DIAGNOSIS — I10 Essential (primary) hypertension: Secondary | ICD-10-CM | POA: Diagnosis not present

## 2019-01-02 ENCOUNTER — Ambulatory Visit: Payer: BLUE CROSS/BLUE SHIELD | Admitting: Physical Therapy

## 2019-01-03 ENCOUNTER — Other Ambulatory Visit: Payer: Self-pay

## 2019-01-03 ENCOUNTER — Ambulatory Visit: Payer: BLUE CROSS/BLUE SHIELD | Attending: Pain Medicine | Admitting: Physical Therapy

## 2019-01-03 ENCOUNTER — Encounter: Payer: Self-pay | Admitting: Physical Therapy

## 2019-01-03 DIAGNOSIS — R208 Other disturbances of skin sensation: Secondary | ICD-10-CM

## 2019-01-03 NOTE — Therapy (Signed)
[place], [place], [address] Phone: [phone]   Fax:  [phone]  Physical Therapy Evaluation  Patient Details  Name: Zoe Swanson MRN: [id number] Date of Birth: 12/10/1975 Referring Provider (PT): Kandace Blitz, MD   Encounter Date: 01/03/2019  PT End of Session - 01/03/19 1009    Visit Number  1    Number of Visits  13    Date for PT Re-Evaluation  02/14/19    PT Start Time  9323    PT Stop Time  1100    PT Time Calculation (min)  45 min    Activity Tolerance  Patient tolerated treatment well;Patient limited by pain    Behavior During Therapy  [facility] for tasks assessed/performed       Past Medical History:  Diagnosis Date  . Asthma    prn inhaler and neb.  Marland Kitchen Axillary mass 07/2014   left  . CRPS (complex regional pain syndrome), lower limb    left ankle  . Hypertension    under better control since adding HCTZ  . Migraine   . Multiple allergies     Past Surgical History:  Procedure Laterality Date  . ANKLE ARTHROTOMY Left 06/02/2011   removal of loose body; anterolateral ligament repair  . BREAST BIOPSY  2008  . BREAST LUMPECTOMY W/ NEEDLE LOCALIZATION Left 12/20/2006  . BREAST LUMPECTOMY WITH RADIOACTIVE SEED LOCALIZATION Left 08/24/2014   Procedure: LEFT BREAST LUMPECTOMY WITH RADIOACTIVE SEED LOCALIZATION;  Surgeon: Fanny Skates, MD;  Location: [facility];  Service: General;  Laterality: Left;  . CESAREAN SECTION  06/30/2009  . KNEE ARTHROSCOPY Left 1995  . SPINAL CORD STIMULATOR IMPLANT Right 07/20/2013   buttock  . TEE WITHOUT CARDIOVERSION N/A 05/20/2017   Procedure: TRANSESOPHAGEAL ECHOCARDIOGRAM (TEE);  Surgeon: Sueanne Margarita, MD;  Location: [facility] ENDOSCOPY;  Service: Cardiovascular;  Laterality: N/A;  . TUBAL LIGATION      There were no vitals filed for this visit.   Subjective Assessment - 01/03/19 1009    Subjective  pt is a 43 y.o F with with CC of  LLE pain and hypersensivitiy and has progressed to the  a year after the implant in 2014 RLE that progresses to the back. She has a spinal stimulator that assists with the pain but rpeorts the legs will go hot and cold. she reports the LLE started when she broke her ankle back when she was college.     Limitations  Walking;Standing;Lifting    How long can you sit comfortably?  1 hour    How long can you stand comfortably?  45 min     How long can you walk comfortably?  60 min    Patient Stated Goals  get back to working out, to decrease pain/ sensitivity,     Currently in Pain?  Yes    Pain Score  10-Worst pain ever   is 10/10   Pain Location  Leg    Pain Orientation  Right;Left;Distal   L>R   Pain Descriptors / Indicators  Aching;Sore;Shooting;Stabbing;Throbbing;Pins and needles;Numbness;Sharp;Tightness    Pain Type  Chronic pain    Pain Onset  More than a month ago    Pain Frequency  Constant    Aggravating Factors   any touching or bumping the leg, prolonges standing/ sitting, when the stimulator is off    Pain Relieving Factors  medication, turning the stimulator on, heating pack         [facility]  PT Assessment - 01/03/19 1010      Assessment   Medical Diagnosis  Chronic regional pain syndrome    Referring Provider (PT)  Kandace Blitz, MD    Onset Date/Surgical Date  --   esitmated LLE started back 15-20 years ago, RLE 2015   Hand Dominance  Right    Next MD Visit  needs to make on PRN    Prior Therapy  yes      Precautions   Precaution Comments  avoid lifting over 20#,       Balance Screen   Has the patient fallen in the past 6 months  Yes    How many times?  3    Has the patient had a decrease in activity level because of a fear of falling?   No    Is the patient reluctant to leave their home because of a fear of falling?   No      Home Film/video editor residence    Living Arrangements  Spouse/significant other    Available Help at  Discharge  Family    Type of [place] to enter    Entrance Stairs-Number of Steps  2    Entrance Stairs-Rails  None    Home Layout  Two level    Alternate Level Stairs-Number of Steps  12    Alternate Level Stairs-Rails  Can reach both      Prior Function   Level of Independence  Independent with basic ADLs    Vocation  Full time employment   track coach   Leisure  working out, running, walking around,       [employer]   Overall Cognitive Status  Within Functional Limits for tasks assessed      Sensation   Light Touch  Impaired Detail    Additional Comments  L L4 increased pain , L5 soreness noted, decreased sensation at S1, on the R increased sensitivy at L1 and L4      ROM / Strength   AROM / PROM / Strength  AROM;Strength      AROM   Overall AROM   Within functional limits for tasks performed    AROM Assessment Site  Knee;Hip      Strength   Strength Assessment Site  Hip;Knee;Ankle    Right/Left Hip  Right;Left    Right Hip Flexion  5/5    Right Hip Extension  4+/5   pain in the scarum   Right Hip ABduction  4/5   pain in the groin during testing   Right Hip ADduction  5/5    Left Hip Flexion  5/5    Left Hip Extension  4+/5   pain in the scarum   Left Hip ABduction  4/5   pain in the groin during testing   Left Hip ADduction  5/5    Right/Left Knee  Right;Left    Right Knee Flexion  5/5    Right Knee Extension  5/5    Left Knee Flexion  5/5    Left Knee Extension  5/5    Right/Left Ankle  --   overall grossly 5/5               Objective measurements completed on examination: See above findings.      [facility] Adult PT Treatment/Exercise - 01/03/19 0001      Manual Therapy   Manual Therapy  Neural Stretch;Other (comment)  Other Manual Therapy  desensitization along the lateral aspect of l ankle x 5 min    Neural Stretch  Sciatic nerve glides / tensioning on the L             PT Education - 01/03/19 1103     Education Details  evaluation findings, POC, goals, HEP with proper form/ rationale, nerve biomechanics.     Person(s) Educated  Patient    Methods  Explanation;Verbal cues;Handout    Comprehension  Verbalized understanding;Verbal cues required       PT Short Term Goals - 01/03/19 1144      PT SHORT TERM GOAL #1   Title  pt to be I with inital HEP     Time  3    Period  Weeks    Status  New    Target Date  01/31/19        PT Long Term Goals - 01/03/19 1144      PT LONG TERM GOAL #1   Title  pt to improve overal hip strength to >/= 5/5 with no report of pain/ soreness during testing    Time  6    Period  Weeks    Status  New    Target Date  02/14/19      PT LONG TERM GOAL #2   Title  pt to not decrease hypersensitivity of bil LE reporting pain to </= 5/10 to demo improvement in condition    Time  6    Period  Weeks    Status  New    Target Date  02/14/19      PT LONG TERM GOAL #3   Title  pt to be able to return to working out with </= 5/10 pain for pt's goals of return     Time  6    Period  Weeks    Status  New    Target Date  02/14/19      PT LONG TERM GOAL #4   Title  pt to be I with all HEP given as of last visit to maintain and progress current level of function    Time  6    Period  Weeks    Status  New    Target Date  02/14/19             Plan - 01/03/19 1138    Clinical Impression Statement  pt presents to OPPT with CC of bil LE hypersensivitiy with L>R and associated dx of CRPS. she has functional ROM grossly in all joints with mild weakness noted in R glute medius. she demonstrates increased hypersensitivity in the LLE dermatomes compared bil (see flowsheet). she would benefit from physical therapy to promote desensitization, increase hip / trunk strength and maxmize overall function by addressing the deficits listed.     Personal Factors and Comorbidities  Age;Comorbidity 2    Comorbidities  spinal cord stimulator implant, hx of ankle fx and  surgeries    Examination-Activity Limitations  Other   exercise   Stability/Clinical Decision Making  Evolving/Moderate complexity    Clinical Decision Making  Moderate    Rehab Potential  Good    PT Duration  6 weeks    PT Treatment/Interventions  ADLs/Self Care Home Management;Cryotherapy;Electrical Stimulation;Iontophoresis 4mg /ml Dexamethasone;Moist Heat;Ultrasound;Contrast Bath;Therapeutic activities;Dry needling;Therapeutic exercise;Taping;Manual techniques;Patient/family education;Passive range of motion;Joint Manipulations;Gait training;Balance training;Neuromuscular re-education    PT Next Visit Plan  review/ update HEP PRN, hip abductor strengthening, sciatic nerve glides, Trial contrast bath/ desensitization,  PT Home Exercise Plan  sidelying hip abduction, ankle ABC, desensitization techniques, seated hamstring stretch    Consulted and Agree with Plan of Care  Patient       Patient will benefit from skilled therapeutic intervention in order to improve the following deficits and impairments:  Impaired sensation, Pain, Decreased activity tolerance  Visit Diagnosis: Other disturbances of skin sensation     Problem List Patient Active Problem List   Diagnosis Date Noted  . Lumbar radiculopathy, chronic 06/01/2018  . Palpitations 11/10/2017  . Insomnia 11/10/2017  . Weight gain 10/21/2017  . Atrial mass   . HTN (hypertension) 06/18/2014  . GENERALIZED ANXIETY DISORDER 03/23/2008  . Allergic rhinitis 03/15/2008  . ASTHMA, EXTRINSIC W/ACUTE EXACERBATION 03/14/2008  . Asthma 08/23/2007   Starr Lake PT, DPT, LAT, ATC  01/03/19  12:12 PM      [facility] [address] [place], [place], [address] Phone: [phone]   Fax:  [phone]  Name: Zoe Swanson MRN: [id number] Date of Birth: 08/01/1976

## 2019-01-14 ENCOUNTER — Other Ambulatory Visit: Payer: Self-pay | Admitting: Family Medicine

## 2019-01-16 ENCOUNTER — Ambulatory Visit: Payer: BLUE CROSS/BLUE SHIELD | Admitting: Physical Therapy

## 2019-01-18 ENCOUNTER — Ambulatory Visit: Payer: BLUE CROSS/BLUE SHIELD | Admitting: Physical Therapy

## 2019-01-25 ENCOUNTER — Ambulatory Visit: Payer: BLUE CROSS/BLUE SHIELD | Admitting: Physical Therapy

## 2019-01-27 ENCOUNTER — Ambulatory Visit: Payer: BLUE CROSS/BLUE SHIELD | Admitting: Physical Therapy

## 2019-01-31 ENCOUNTER — Ambulatory Visit: Payer: BLUE CROSS/BLUE SHIELD | Admitting: Physical Therapy

## 2019-02-02 ENCOUNTER — Ambulatory Visit: Payer: BLUE CROSS/BLUE SHIELD | Admitting: Physical Therapy

## 2019-02-07 ENCOUNTER — Encounter: Payer: BLUE CROSS/BLUE SHIELD | Admitting: Physical Therapy

## 2019-02-09 ENCOUNTER — Encounter: Payer: BLUE CROSS/BLUE SHIELD | Admitting: Physical Therapy

## 2019-02-13 ENCOUNTER — Encounter: Payer: BLUE CROSS/BLUE SHIELD | Admitting: Physical Therapy

## 2019-02-16 ENCOUNTER — Ambulatory Visit: Payer: BLUE CROSS/BLUE SHIELD | Admitting: Physical Therapy

## 2019-02-20 ENCOUNTER — Telehealth: Payer: Self-pay | Admitting: Physical Therapy

## 2019-02-20 NOTE — Telephone Encounter (Signed)
Zoe Swanson was contacted today regarding the temporary reduction of OP Rehab Services due to concerns for community transmission of Covid-19.    Therapist advised the patient to continue to perform their HEP and assured they had no unanswered questions at this time.  She reported she was interested in Telehealth and has her mychart setup and email attached.   Discussed with pt that once insurance benefits get reviewed our front office staff would reach out to her to set up that appointment.

## 2019-04-14 DIAGNOSIS — M5416 Radiculopathy, lumbar region: Secondary | ICD-10-CM | POA: Diagnosis not present

## 2019-04-14 DIAGNOSIS — Z9689 Presence of other specified functional implants: Secondary | ICD-10-CM | POA: Diagnosis not present

## 2019-04-14 DIAGNOSIS — G894 Chronic pain syndrome: Secondary | ICD-10-CM | POA: Diagnosis not present

## 2019-04-14 DIAGNOSIS — G90522 Complex regional pain syndrome I of left lower limb: Secondary | ICD-10-CM | POA: Diagnosis not present

## 2019-04-22 DIAGNOSIS — M545 Low back pain: Secondary | ICD-10-CM | POA: Diagnosis not present

## 2019-04-22 DIAGNOSIS — Z1159 Encounter for screening for other viral diseases: Secondary | ICD-10-CM | POA: Diagnosis not present

## 2019-04-22 DIAGNOSIS — Z9682 Presence of neurostimulator: Secondary | ICD-10-CM | POA: Diagnosis not present

## 2019-04-22 DIAGNOSIS — Z01812 Encounter for preprocedural laboratory examination: Secondary | ICD-10-CM | POA: Diagnosis not present

## 2019-04-22 DIAGNOSIS — G894 Chronic pain syndrome: Secondary | ICD-10-CM | POA: Diagnosis not present

## 2019-04-23 ENCOUNTER — Other Ambulatory Visit: Payer: Self-pay | Admitting: Family Medicine

## 2019-04-25 DIAGNOSIS — J45909 Unspecified asthma, uncomplicated: Secondary | ICD-10-CM | POA: Diagnosis not present

## 2019-04-25 DIAGNOSIS — Z888 Allergy status to other drugs, medicaments and biological substances status: Secondary | ICD-10-CM | POA: Diagnosis not present

## 2019-04-25 DIAGNOSIS — I10 Essential (primary) hypertension: Secondary | ICD-10-CM | POA: Diagnosis not present

## 2019-04-25 DIAGNOSIS — M5416 Radiculopathy, lumbar region: Secondary | ICD-10-CM | POA: Diagnosis not present

## 2019-04-25 DIAGNOSIS — M545 Low back pain: Secondary | ICD-10-CM | POA: Diagnosis not present

## 2019-04-25 DIAGNOSIS — M549 Dorsalgia, unspecified: Secondary | ICD-10-CM | POA: Diagnosis not present

## 2019-04-25 DIAGNOSIS — Z853 Personal history of malignant neoplasm of breast: Secondary | ICD-10-CM | POA: Diagnosis not present

## 2019-04-25 DIAGNOSIS — G8929 Other chronic pain: Secondary | ICD-10-CM | POA: Diagnosis not present

## 2019-04-25 DIAGNOSIS — Z4549 Encounter for adjustment and management of other implanted nervous system device: Secondary | ICD-10-CM | POA: Diagnosis not present

## 2019-04-25 DIAGNOSIS — Z885 Allergy status to narcotic agent status: Secondary | ICD-10-CM | POA: Diagnosis not present

## 2019-04-25 DIAGNOSIS — F419 Anxiety disorder, unspecified: Secondary | ICD-10-CM | POA: Diagnosis not present

## 2019-04-25 NOTE — Telephone Encounter (Signed)
Dr. Fry please advise on refill of meds. Thanks  

## 2019-05-05 DIAGNOSIS — Z9689 Presence of other specified functional implants: Secondary | ICD-10-CM | POA: Diagnosis not present

## 2019-05-05 DIAGNOSIS — M5416 Radiculopathy, lumbar region: Secondary | ICD-10-CM | POA: Diagnosis not present

## 2019-05-05 DIAGNOSIS — G894 Chronic pain syndrome: Secondary | ICD-10-CM | POA: Diagnosis not present

## 2019-05-05 DIAGNOSIS — G90522 Complex regional pain syndrome I of left lower limb: Secondary | ICD-10-CM | POA: Diagnosis not present

## 2019-05-26 DIAGNOSIS — G90522 Complex regional pain syndrome I of left lower limb: Secondary | ICD-10-CM | POA: Diagnosis not present

## 2019-05-26 DIAGNOSIS — M5416 Radiculopathy, lumbar region: Secondary | ICD-10-CM | POA: Diagnosis not present

## 2019-05-26 DIAGNOSIS — G894 Chronic pain syndrome: Secondary | ICD-10-CM | POA: Diagnosis not present

## 2019-05-26 DIAGNOSIS — Z9689 Presence of other specified functional implants: Secondary | ICD-10-CM | POA: Diagnosis not present

## 2019-06-12 ENCOUNTER — Encounter: Payer: Self-pay | Admitting: Family Medicine

## 2019-06-14 MED ORDER — PHENTERMINE HCL 30 MG PO CAPS: 30.0000 mg | ORAL_CAPSULE | ORAL | 1 refills | Status: DC

## 2019-06-14 NOTE — Telephone Encounter (Signed)
I sent in a 6 month supply

## 2019-08-10 DIAGNOSIS — Z20828 Contact with and (suspected) exposure to other viral communicable diseases: Secondary | ICD-10-CM | POA: Diagnosis not present

## 2019-08-10 DIAGNOSIS — Z7189 Other specified counseling: Secondary | ICD-10-CM | POA: Diagnosis not present

## 2019-09-01 DIAGNOSIS — Z20828 Contact with and (suspected) exposure to other viral communicable diseases: Secondary | ICD-10-CM | POA: Diagnosis not present

## 2019-09-01 DIAGNOSIS — Z7189 Other specified counseling: Secondary | ICD-10-CM | POA: Diagnosis not present

## 2019-09-02 ENCOUNTER — Other Ambulatory Visit: Payer: Self-pay

## 2019-09-02 ENCOUNTER — Ambulatory Visit (INDEPENDENT_AMBULATORY_CARE_PROVIDER_SITE_OTHER): Payer: BC Managed Care – PPO

## 2019-09-02 DIAGNOSIS — Z23 Encounter for immunization: Secondary | ICD-10-CM | POA: Diagnosis not present

## 2019-09-07 DIAGNOSIS — Z01419 Encounter for gynecological examination (general) (routine) without abnormal findings: Secondary | ICD-10-CM | POA: Diagnosis not present

## 2019-09-07 DIAGNOSIS — Z6828 Body mass index (BMI) 28.0-28.9, adult: Secondary | ICD-10-CM | POA: Diagnosis not present

## 2019-09-07 DIAGNOSIS — Z1231 Encounter for screening mammogram for malignant neoplasm of breast: Secondary | ICD-10-CM | POA: Diagnosis not present

## 2019-09-10 ENCOUNTER — Encounter: Payer: Self-pay | Admitting: Family Medicine

## 2019-09-11 ENCOUNTER — Ambulatory Visit: Payer: BC Managed Care – PPO | Admitting: Family Medicine

## 2019-09-12 ENCOUNTER — Encounter: Payer: Self-pay | Admitting: Family Medicine

## 2019-09-12 ENCOUNTER — Ambulatory Visit (INDEPENDENT_AMBULATORY_CARE_PROVIDER_SITE_OTHER): Payer: BC Managed Care – PPO | Admitting: Family Medicine

## 2019-09-12 ENCOUNTER — Other Ambulatory Visit: Payer: Self-pay

## 2019-09-12 ENCOUNTER — Ambulatory Visit: Payer: BC Managed Care – PPO | Admitting: Family Medicine

## 2019-09-12 VITALS — BP 120/82 | HR 102 | Temp 98.3°F | Ht 65.5 in | Wt 181.0 lb

## 2019-09-12 DIAGNOSIS — I10 Essential (primary) hypertension: Secondary | ICD-10-CM | POA: Diagnosis not present

## 2019-09-12 DIAGNOSIS — G4700 Insomnia, unspecified: Secondary | ICD-10-CM | POA: Diagnosis not present

## 2019-09-12 DIAGNOSIS — F411 Generalized anxiety disorder: Secondary | ICD-10-CM | POA: Diagnosis not present

## 2019-09-12 DIAGNOSIS — K089 Disorder of teeth and supporting structures, unspecified: Secondary | ICD-10-CM | POA: Diagnosis not present

## 2019-09-12 DIAGNOSIS — Z03818 Encounter for observation for suspected exposure to other biological agents ruled out: Secondary | ICD-10-CM | POA: Diagnosis not present

## 2019-09-12 LAB — HEPATIC FUNCTION PANEL
ALT: 9 U/L (ref 0–35)
AST: 11 U/L (ref 0–37)
Albumin: 4.5 g/dL (ref 3.5–5.2)
Alkaline Phosphatase: 72 U/L (ref 39–117)
Bilirubin, Direct: 0.1 mg/dL (ref 0.0–0.3)
Total Bilirubin: 0.3 mg/dL (ref 0.2–1.2)
Total Protein: 7.5 g/dL (ref 6.0–8.3)

## 2019-09-12 LAB — CBC WITH DIFFERENTIAL/PLATELET
Basophils Absolute: 0.1 10*3/uL (ref 0.0–0.1)
Basophils Relative: 1 % (ref 0.0–3.0)
Eosinophils Absolute: 0.1 10*3/uL (ref 0.0–0.7)
Eosinophils Relative: 1.2 % (ref 0.0–5.0)
HCT: 40.8 % (ref 36.0–46.0)
Hemoglobin: 13.3 g/dL (ref 12.0–15.0)
Lymphocytes Relative: 28.2 % (ref 12.0–46.0)
Lymphs Abs: 2.4 10*3/uL (ref 0.7–4.0)
MCHC: 32.7 g/dL (ref 30.0–36.0)
MCV: 86.9 fl (ref 78.0–100.0)
Monocytes Absolute: 0.6 10*3/uL (ref 0.1–1.0)
Monocytes Relative: 7.7 % (ref 3.0–12.0)
Neutro Abs: 5.1 10*3/uL (ref 1.4–7.7)
Neutrophils Relative %: 61.9 % (ref 43.0–77.0)
Platelets: 327 10*3/uL (ref 150.0–400.0)
RBC: 4.7 Mil/uL (ref 3.87–5.11)
RDW: 14.6 % (ref 11.5–15.5)
WBC: 8.3 10*3/uL (ref 4.0–10.5)

## 2019-09-12 LAB — T3, FREE: T3, Free: 3.4 pg/mL (ref 2.3–4.2)

## 2019-09-12 LAB — BASIC METABOLIC PANEL
BUN: 15 mg/dL (ref 6–23)
CO2: 25 mEq/L (ref 19–32)
Calcium: 9.3 mg/dL (ref 8.4–10.5)
Chloride: 103 mEq/L (ref 96–112)
Creatinine, Ser: 0.82 mg/dL (ref 0.40–1.20)
GFR: 91.23 mL/min (ref >60.00)
Glucose, Bld: 100 mg/dL — ABNORMAL HIGH (ref 70–99)
Potassium: 3.9 mEq/L (ref 3.5–5.1)
Sodium: 136 mEq/L (ref 135–145)

## 2019-09-12 LAB — T4, FREE: Free T4: 0.8 ng/dL (ref 0.60–1.60)

## 2019-09-12 LAB — TSH: TSH: 1.85 u[IU]/mL (ref 0.35–4.50)

## 2019-09-12 MED ORDER — ZOLPIDEM TARTRATE 10 MG PO TABS: 20.0000 mg | ORAL_TABLET | Freq: Every day | ORAL | 5 refills | Status: DC

## 2019-09-12 MED ORDER — AMOXICILLIN 500 MG PO CAPS: ORAL_CAPSULE | ORAL | 0 refills | Status: DC

## 2019-09-12 MED ORDER — NEBIVOLOL HCL 5 MG PO TABS: 5.0000 mg | ORAL_TABLET | Freq: Every day | ORAL | 3 refills | Status: DC

## 2019-09-12 NOTE — Progress Notes (Signed)
   Subjective:    Patient ID: Zoe Swanson, female    DOB: 23-Nov-1975, 43 y.o.   MRN: [id number]  HPI Here for several issues. First her BP has been jumping up at times for several months. At home she has gotten readings as high as 202/102, but at other times it returns to normal. 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 Her heart rate also jumps up at times, and as we speak it is over 100. She was recently at her dentist's office and they noted her BP to be up. They need to do a procedure and they asked her to get antibiotics from [place] to take prior to that day. She also has had trohble sleeping despite taking Zolpidem. She often tosses and turns all night worrying about things.    Review of Systems  Constitutional: Negative.   Respiratory: Negative.   Cardiovascular: Negative.   Neurological: Negative.   Psychiatric/Behavioral: Positive for sleep disturbance. Negative for agitation, confusion, decreased concentration, dysphoric mood and hallucinations. The patient is nervous/anxious.        Objective:   Physical Exam Constitutional:      Appearance: Normal appearance. She is not ill-appearing.  Cardiovascular:     Rate and Rhythm: Regular rhythm. Tachycardia present.     Pulses: Normal pulses.     Heart sounds: Normal heart sounds.  Pulmonary:     Effort: Pulmonary effort is normal.     Breath sounds: Normal breath sounds.  Neurological:     General: No focal deficit present.     Mental Status: She is alert and oriented to person, place, and time.  Psychiatric:        Behavior: Behavior normal.        Thought Content: Thought content normal.        Judgment: Judgment normal.     Comments: Anxious            Assessment & Plan:  For the labile HTN, she will try Bystolic 5 mg daily. For sleep we will increase the Zolpidem to 2 tabs (total of 20 mg) at bedtime. For dental procedures, she will take  Amoxicillin on the mornings before.  Alysia Penna, MD

## 2019-09-13 ENCOUNTER — Ambulatory Visit: Payer: BC Managed Care – PPO | Admitting: Family Medicine

## 2019-10-02 ENCOUNTER — Other Ambulatory Visit: Payer: Self-pay | Admitting: Dermatology

## 2019-10-02 DIAGNOSIS — D485 Neoplasm of uncertain behavior of skin: Secondary | ICD-10-CM | POA: Diagnosis not present

## 2019-10-02 DIAGNOSIS — L309 Dermatitis, unspecified: Secondary | ICD-10-CM | POA: Diagnosis not present

## 2019-10-02 DIAGNOSIS — L739 Follicular disorder, unspecified: Secondary | ICD-10-CM | POA: Diagnosis not present

## 2019-10-14 ENCOUNTER — Other Ambulatory Visit: Payer: Self-pay | Admitting: Family Medicine

## 2019-10-16 NOTE — Telephone Encounter (Signed)
Last filled 04/25/2019 Last OV 09/12/2019

## 2019-11-17 DIAGNOSIS — H10413 Chronic giant papillary conjunctivitis, bilateral: Secondary | ICD-10-CM | POA: Diagnosis not present

## 2020-01-22 ENCOUNTER — Other Ambulatory Visit: Payer: Self-pay | Admitting: Dermatology

## 2020-01-22 ENCOUNTER — Other Ambulatory Visit: Payer: Self-pay | Admitting: Family Medicine

## 2020-01-29 ENCOUNTER — Telehealth (INDEPENDENT_AMBULATORY_CARE_PROVIDER_SITE_OTHER): Payer: Self-pay | Admitting: Family Medicine

## 2020-01-29 ENCOUNTER — Encounter: Payer: Self-pay | Admitting: Family Medicine

## 2020-01-29 DIAGNOSIS — I10 Essential (primary) hypertension: Secondary | ICD-10-CM

## 2020-01-29 DIAGNOSIS — F411 Generalized anxiety disorder: Secondary | ICD-10-CM

## 2020-01-29 DIAGNOSIS — R002 Palpitations: Secondary | ICD-10-CM

## 2020-01-29 MED ORDER — NEBIVOLOL HCL 10 MG PO TABS: 10.0000 mg | ORAL_TABLET | Freq: Every day | ORAL | 5 refills | Status: DC

## 2020-01-29 NOTE — Addendum Note (Signed)
Addended by: Alysia Penna A on: 01/29/2020 11:54 AM   Modules accepted: Orders

## 2020-01-29 NOTE — Progress Notes (Signed)
   Subjective:    Patient ID: Zoe Swanson, female    DOB: 06-12-1976, 44 y.o.   MRN: [id number]  HPI Virtual Visit via Telephone Note  I connected with the patient on 01/29/20 at 10:45 AM EDT by telephone and verified that I am speaking with the correct person using two identifiers.   I discussed the limitations, risks, security and privacy concerns of performing an evaluation and management service by telephone and the availability of in person appointments. I also discussed with the patient that there may be a patient responsible charge related to this service. The patient expressed understanding and agreed to proceed.  Location patient: home Location provider: work or home office Participants present for the call: patient, provider Patient did not have a visit in the prior 7 days to address this/these issue(s).   History of Present Illness: Here for episodes of elevated BP to 162/105 and elevated heart rates to the 140s over the past week. No chest pain or SOB. She feels like her heart "is going to beat out of my chest". She admits to being under a lot of stress lately. She is taking Xanax BID, and for the past week she has been taking some other type of medication, prescribed by Dr. Corinna Capra (her GYN), for anxiety. Of note in 2018 she had an ECHO and a treadmill stress test with Dr. Claiborne Billings, and these were unremarkable. She also had a 48 hour Holter monitor then which showed periods of sinus tachycardia up to the 140s but nothing significant.    Observations/Objective: Patient sounds cheerful and well on the phone. I do not appreciate any SOB. Speech and thought processing are grossly intact. Patient reported vitals:  Assessment and Plan: For the HTN she will increase the Bystolic to 10 mg daily, and this should also slow done the rapid heart rates. She will see me in one week for an in person OV to get labs and to start on a 30 day cardiac monitor.  Alysia Penna, MD   Follow Up  Instructions:     [phone] 5-10 [phone] 11-20 [phone] 21-30 I did not refer this patient for an OV in the next 24 hours for this/these issue(s).  I discussed the assessment and treatment plan with the patient. The patient was provided an opportunity to ask questions and all were answered. The patient agreed with the plan and demonstrated an understanding of the instructions.   The patient was advised to call back or seek an in-person evaluation if the symptoms worsen or if the condition fails to improve as anticipated.  I provided 15 minutes of non-face-to-face time during this encounter.   Alysia Penna, MD    Review of Systems     Objective:   Physical Exam        Assessment & Plan:

## 2020-02-07 ENCOUNTER — Encounter: Payer: Self-pay | Admitting: Family Medicine

## 2020-02-07 ENCOUNTER — Other Ambulatory Visit: Payer: Self-pay | Admitting: Family Medicine

## 2020-02-07 DIAGNOSIS — R002 Palpitations: Secondary | ICD-10-CM

## 2020-02-07 NOTE — Telephone Encounter (Signed)
I understand. Let's stay with the current medications and see how she does over the next few weeks

## 2020-02-08 NOTE — Telephone Encounter (Signed)
I ordered a cardiac event monitor. They will contact her

## 2020-02-09 ENCOUNTER — Telehealth: Payer: Self-pay | Admitting: Radiology

## 2020-02-09 NOTE — Telephone Encounter (Signed)
Enrolled patient for a 30 day [employer] Event monitor to be mailed to patients home. Brief instructions were gone over with the patient and she knows to expect the monitor to arrive in 5-7 days

## 2020-02-12 ENCOUNTER — Encounter: Payer: Self-pay | Admitting: Family Medicine

## 2020-02-13 ENCOUNTER — Encounter (INDEPENDENT_AMBULATORY_CARE_PROVIDER_SITE_OTHER): Payer: BC Managed Care – PPO

## 2020-02-13 DIAGNOSIS — R002 Palpitations: Secondary | ICD-10-CM

## 2020-03-04 ENCOUNTER — Other Ambulatory Visit: Payer: Self-pay | Admitting: Family Medicine

## 2020-03-06 NOTE — Telephone Encounter (Signed)
Okay for refill?  

## 2020-03-12 ENCOUNTER — Encounter: Payer: Self-pay | Admitting: Family Medicine

## 2020-03-12 NOTE — Telephone Encounter (Signed)
Noted  

## 2020-03-15 ENCOUNTER — Other Ambulatory Visit: Payer: Self-pay

## 2020-03-15 ENCOUNTER — Ambulatory Visit (INDEPENDENT_AMBULATORY_CARE_PROVIDER_SITE_OTHER): Payer: BC Managed Care – PPO | Admitting: Family Medicine

## 2020-03-15 ENCOUNTER — Encounter: Payer: Self-pay | Admitting: Family Medicine

## 2020-03-15 VITALS — BP 120/62 | HR 74 | Temp 98.0°F | Wt 165.6 lb

## 2020-03-15 DIAGNOSIS — F411 Generalized anxiety disorder: Secondary | ICD-10-CM | POA: Diagnosis not present

## 2020-03-15 DIAGNOSIS — M5416 Radiculopathy, lumbar region: Secondary | ICD-10-CM | POA: Diagnosis not present

## 2020-03-15 DIAGNOSIS — I10 Essential (primary) hypertension: Secondary | ICD-10-CM | POA: Diagnosis not present

## 2020-03-15 DIAGNOSIS — R002 Palpitations: Secondary | ICD-10-CM | POA: Diagnosis not present

## 2020-03-15 MED ORDER — METHYLPREDNISOLONE ACETATE 80 MG/ML IJ SUSP
80.0000 mg | Freq: Once | INTRAMUSCULAR | Status: AC
  Administered 2020-03-15: 80 mg via INTRAMUSCULAR

## 2020-03-15 MED ORDER — METHYLPREDNISOLONE ACETATE 40 MG/ML IJ SUSP
40.0000 mg | Freq: Once | INTRAMUSCULAR | Status: AC
Start: 2020-03-15 — End: 2020-03-15
  Administered 2020-03-15: 40 mg via INTRAMUSCULAR

## 2020-03-15 NOTE — Progress Notes (Signed)
   Subjective:    Patient ID: Zoe Swanson, female    DOB: [birth year], 44 y.o.   MRN: [id number]  HPI Here to follow up on palpitaions, HTN, and anxiety. At our last visit we increased her Bystolic from 5 mg to 10 mg daily, and this has been very successful. Her heart rates now average in the 70s and 80s, and her BP is stable. However her anxiety continues to be a problem. This stems from a number of work and personal issues. She has been taking Lexapro 10 mg daily. Also her low back pain has been acting up lately despite taking Ibuprofen. She requests a steroid shot today.    Review of Systems  Constitutional: Negative.   Respiratory: Negative.   Cardiovascular: Negative.   Psychiatric/Behavioral: Negative for agitation and dysphoric mood. The patient is nervous/anxious.        Objective:   Physical Exam Constitutional:      Appearance: Normal appearance. She is not ill-appearing.  Cardiovascular:     Rate and Rhythm: Normal rate and regular rhythm.     Pulses: Normal pulses.     Heart sounds: Normal heart sounds.  Pulmonary:     Effort: Pulmonary effort is normal.     Breath sounds: Normal breath sounds.  Neurological:     Mental Status: She is alert.  Psychiatric:        Mood and Affect: Mood normal.        Behavior: Behavior normal.        Thought Content: Thought content normal.        Judgment: Judgment normal.           Assessment & Plan:  Her HTN and palpitations are now controlled. For the anxiety, she will increase the Lexapro to 20 mg daily. Recheck in 3-4 weeks. For the back pain, she is given a shot of DepoMedrol.  Alysia Penna, MD

## 2020-03-31 ENCOUNTER — Encounter: Payer: Self-pay | Admitting: Family Medicine

## 2020-04-01 ENCOUNTER — Telehealth: Payer: Self-pay | Admitting: Cardiovascular Disease

## 2020-04-01 ENCOUNTER — Encounter: Payer: Self-pay | Admitting: Family Medicine

## 2020-04-01 MED ORDER — ESCITALOPRAM OXALATE 20 MG PO TABS
20.0000 mg | ORAL_TABLET | Freq: Every day | ORAL | 3 refills | Status: DC
Start: 2020-04-01 — End: 2020-12-10

## 2020-04-01 MED ORDER — ALPRAZOLAM 1 MG PO TABS: 1.0000 mg | ORAL_TABLET | Freq: Four times a day (QID) | ORAL | 5 refills | Status: DC | PRN

## 2020-04-01 NOTE — Telephone Encounter (Signed)
Accessed patient's chart in regards to her calling about her monitor results. I advised Edell the request for the monitor was ordered by Dr. Sarajane Jews so the results would be sent to him and given through his office. Patient verbalized understanding.

## 2020-04-01 NOTE — Telephone Encounter (Signed)
I refilled Lexapro and Xanax

## 2020-04-01 NOTE — Telephone Encounter (Signed)
I do not see that lexapro was sent in. I do not see Zantac on the patients med list. Please advise.

## 2020-04-03 ENCOUNTER — Other Ambulatory Visit: Payer: Self-pay | Admitting: Family Medicine

## 2020-04-03 ENCOUNTER — Other Ambulatory Visit: Payer: Self-pay | Admitting: Dermatology

## 2020-04-03 MED ORDER — BETAMETHASONE DIPROPIONATE AUG 0.05 % EX CREA: TOPICAL_CREAM | Freq: Every day | CUTANEOUS | 0 refills | Status: DC

## 2020-04-03 MED ORDER — TRIAMCINOLONE ACETONIDE 0.1 % EX CREA: TOPICAL_CREAM | Freq: Two times a day (BID) | CUTANEOUS | 0 refills | Status: DC

## 2020-05-27 DIAGNOSIS — M84375A Stress fracture, left foot, initial encounter for fracture: Secondary | ICD-10-CM | POA: Diagnosis not present

## 2020-05-31 ENCOUNTER — Other Ambulatory Visit: Payer: Self-pay | Admitting: Family Medicine

## 2020-05-31 ENCOUNTER — Other Ambulatory Visit: Payer: Self-pay | Admitting: Dermatology

## 2020-06-04 DIAGNOSIS — M84375D Stress fracture, left foot, subsequent encounter for fracture with routine healing: Secondary | ICD-10-CM | POA: Diagnosis not present

## 2020-06-05 ENCOUNTER — Other Ambulatory Visit: Payer: Self-pay | Admitting: Family Medicine

## 2020-06-11 ENCOUNTER — Encounter (HOSPITAL_COMMUNITY): Payer: Self-pay | Admitting: Emergency Medicine

## 2020-06-11 ENCOUNTER — Emergency Department (HOSPITAL_COMMUNITY): Payer: BC Managed Care – PPO

## 2020-06-11 ENCOUNTER — Other Ambulatory Visit: Payer: Self-pay

## 2020-06-11 ENCOUNTER — Emergency Department (HOSPITAL_COMMUNITY)
Admission: EM | Admit: 2020-06-11 | Discharge: 2020-06-11 | Disposition: A | Discharge status: Home / Self Care | Payer: BC Managed Care – PPO | Attending: Emergency Medicine | Admitting: Emergency Medicine

## 2020-06-11 DIAGNOSIS — S81052A Open bite, left knee, initial encounter: Secondary | ICD-10-CM | POA: Diagnosis not present

## 2020-06-11 DIAGNOSIS — Y929 Unspecified place or not applicable: Secondary | ICD-10-CM | POA: Insufficient documentation

## 2020-06-11 DIAGNOSIS — I10 Essential (primary) hypertension: Secondary | ICD-10-CM | POA: Insufficient documentation

## 2020-06-11 DIAGNOSIS — S61452A Open bite of left hand, initial encounter: Secondary | ICD-10-CM | POA: Insufficient documentation

## 2020-06-11 DIAGNOSIS — S51052A Open bite, left elbow, initial encounter: Secondary | ICD-10-CM | POA: Diagnosis not present

## 2020-06-11 DIAGNOSIS — Y939 Activity, unspecified: Secondary | ICD-10-CM | POA: Insufficient documentation

## 2020-06-11 DIAGNOSIS — Y999 Unspecified external cause status: Secondary | ICD-10-CM | POA: Insufficient documentation

## 2020-06-11 DIAGNOSIS — S81042A Puncture wound with foreign body, left knee, initial encounter: Secondary | ICD-10-CM | POA: Diagnosis not present

## 2020-06-11 DIAGNOSIS — Z79899 Other long term (current) drug therapy: Secondary | ICD-10-CM | POA: Diagnosis not present

## 2020-06-11 DIAGNOSIS — Z23 Encounter for immunization: Secondary | ICD-10-CM | POA: Insufficient documentation

## 2020-06-11 DIAGNOSIS — J45901 Unspecified asthma with (acute) exacerbation: Secondary | ICD-10-CM | POA: Diagnosis not present

## 2020-06-11 DIAGNOSIS — W540XXA Bitten by dog, initial encounter: Secondary | ICD-10-CM | POA: Insufficient documentation

## 2020-06-11 DIAGNOSIS — S61552A Open bite of left wrist, initial encounter: Secondary | ICD-10-CM | POA: Diagnosis not present

## 2020-06-11 DIAGNOSIS — S61432A Puncture wound without foreign body of left hand, initial encounter: Secondary | ICD-10-CM | POA: Diagnosis not present

## 2020-06-11 DIAGNOSIS — S51032A Puncture wound without foreign body of left elbow, initial encounter: Secondary | ICD-10-CM | POA: Diagnosis not present

## 2020-06-11 MED ORDER — RABIES IMMUNE GLOBULIN 150 UNIT/ML IM INJ
20.0000 [IU]/kg | INJECTION | Freq: Once | INTRAMUSCULAR | Status: AC
  Administered 2020-06-11: 1500 [IU] via INTRAMUSCULAR
  Filled 2020-06-11: qty 10

## 2020-06-11 MED ORDER — TETANUS-DIPHTH-ACELL PERTUSSIS 5-2.5-18.5 LF-MCG/0.5 IM SUSP
0.5000 mL | Freq: Once | INTRAMUSCULAR | Status: AC
  Administered 2020-06-11: 0.5 mL via INTRAMUSCULAR
  Filled 2020-06-11: qty 0.5

## 2020-06-11 MED ORDER — AMOXICILLIN-POT CLAVULANATE 875-125 MG PO TABS
1.0000 | ORAL_TABLET | Freq: Two times a day (BID) | ORAL | 0 refills | Status: AC
Start: 2020-06-11 — End: 2020-06-17

## 2020-06-11 MED ORDER — RABIES VACCINE, PCEC IM SUSR
1.0000 mL | Freq: Once | INTRAMUSCULAR | Status: AC
  Administered 2020-06-11: 1 mL via INTRAMUSCULAR
  Filled 2020-06-11: qty 1

## 2020-06-11 MED ORDER — AMOXICILLIN-POT CLAVULANATE 875-125 MG PO TABS
1.0000 | ORAL_TABLET | Freq: Once | ORAL | Status: AC
  Administered 2020-06-11: 1 via ORAL
  Filled 2020-06-11: qty 1

## 2020-06-11 MED ORDER — KETOROLAC TROMETHAMINE 60 MG/2ML IM SOLN
60.0000 mg | Freq: Once | INTRAMUSCULAR | Status: AC
  Administered 2020-06-11: 60 mg via INTRAMUSCULAR
  Filled 2020-06-11: qty 2

## 2020-06-11 NOTE — ED Triage Notes (Signed)
Pt walking her dog when another dog not on a leash got into altercation with dog jerking pt holding leash. Pt fell backwards. C/o left arm pains. Has puncture marks on left hand and left knee unsure if their dog who shots are up to date or other unknown dog.

## 2020-06-11 NOTE — Discharge Instructions (Addendum)
You will need continued rabies vaccines.  Please call your primary care provider to see if they can provide these services, otherwise go to an urgent care where they can accommodate you.   Schedule: Day 0: 06/11/2020 --rabies vaccine and rabies immunoglobulin Day 3: 06/14/2020 --rabies vaccine Day 7: 06/18/2020 --rabies vaccine Day 14: 06/25/2020 --rabies vaccine  Please take the Augmentin, as directed.  Return to the ED or seek immediate medical attention for any new or worsening symptoms.

## 2020-06-11 NOTE — ED Notes (Signed)
Wound care applied to sites of dog bite. Pt was educated on how to continue wound care at home.

## 2020-06-11 NOTE — ED Provider Notes (Signed)
[facility] DEPT Provider Note   CSN: [id number] Arrival date & time: 06/11/20  1657     History Chief Complaint  Patient presents with  . Animal Bite  . Arm Injury    Zoe Swanson is a 44 y.o. female with no relevant past medical history presents to the ED after dog bite.  Patient reportedly was on a walking trail with her son when a dog came out of nowhere, unleashed, and she sustained puncture wounds to her left elbow, left hand and wrist, and left knee.  This occurred immediately PTA.  Patient reports that it all happened so fast and that it is a blur.  She does not recall whether or not the dog bites were from her dog or from the attacking dog.  Her dog is up-to-date on its vaccinations, however she is unclear on the offending dog.  They did not contact [employer] or animal control and to their knowledge the dog has not been captured or detained.  She is not on blood thinners.  She is unsure as her last tetanus vaccination.  She also denies any numbness, weakness, or other symptoms.  Patient adamantly denies possibility of pregnancy.   HPI     Past Medical History:  Diagnosis Date  . Asthma    prn inhaler and neb.  Marland Kitchen Axillary mass 07/2014   left  . CRPS (complex regional pain syndrome), lower limb    left ankle  . Hypertension    under better control since adding HCTZ  . Migraine   . Multiple allergies     Patient Active Problem List   Diagnosis Date Noted  . Lumbar radiculopathy, chronic 06/01/2018  . Palpitations 11/10/2017  . Insomnia 11/10/2017  . Weight gain 10/21/2017  . Atrial mass   . HTN (hypertension) 06/18/2014  . GENERALIZED ANXIETY DISORDER 03/23/2008  . Allergic rhinitis 03/15/2008  . ASTHMA, EXTRINSIC W/ACUTE EXACERBATION 03/14/2008  . Asthma 08/23/2007    Past Surgical History:  Procedure Laterality Date  . ANKLE ARTHROTOMY Left 06/02/2011   removal of loose body; anterolateral ligament repair  . BREAST BIOPSY  2008  .  BREAST LUMPECTOMY W/ NEEDLE LOCALIZATION Left 12/20/2006  . BREAST LUMPECTOMY WITH RADIOACTIVE SEED LOCALIZATION Left 08/24/2014   Procedure: LEFT BREAST LUMPECTOMY WITH RADIOACTIVE SEED LOCALIZATION;  Surgeon: Fanny Skates, MD;  Location: [facility];  Service: General;  Laterality: Left;  . CESAREAN SECTION  06/30/2009  . KNEE ARTHROSCOPY Left 1995  . SPINAL CORD STIMULATOR IMPLANT Right 07/20/2013   buttock  . TEE WITHOUT CARDIOVERSION N/A 05/20/2017   Procedure: TRANSESOPHAGEAL ECHOCARDIOGRAM (TEE);  Surgeon: Sueanne Margarita, MD;  Location: [facility] ENDOSCOPY;  Service: Cardiovascular;  Laterality: N/A;  . TUBAL LIGATION       OB History    Gravida  1   Para      Term      Preterm      AB      Living        SAB      TAB      Ectopic      Multiple      Live Births              Family History  Problem Relation Age of Onset  . Hypertension Mother   . Diabetes Mellitus II Mother   . Hypertension Father     Social History   Tobacco Use  . Smoking status: Never Smoker  . Smokeless tobacco:  Never Used  Vaping Use  . Vaping Use: Never used  Substance Use Topics  . Alcohol use: Yes    Alcohol/week: 0.0 standard drinks    Comment: occasionally  . Drug use: No    Home Medications Prior to Admission medications   Medication Sig Start Date End Date Taking? Authorizing Provider  albuterol (PROVENTIL) (2.5 MG/3ML) 0.083% nebulizer solution Take 3 mLs (2.5 mg total) by nebulization every 4 (four) hours as needed for wheezing or shortness of breath (asthma). 02/15/18   Laurey Morale, MD  ALPRAZolam Duanne Moron) 1 MG tablet Take 1 tablet (1 mg total) by mouth every 6 (six) hours as needed. for anxiety 04/01/20   Laurey Morale, MD  amoxicillin-clavulanate (AUGMENTIN) 875-125 MG tablet Take 1 tablet by mouth every 12 (twelve) hours for 6 days. 06/11/20 06/17/20  Corena Herter, PA-C  augmented betamethasone dipropionate (DIPROLENE-AF) 0.05 % cream APPLY TO AFFECTED  AREA(S) DAILY TOPICALLY 06/03/20   Lavonna Monarch, MD  COMBIVENT RESPIMAT 20-100 MCG/ACT AERS respimat INHALE ONE INHALATION BY MOUTH EVERY 6 HOURS AS NEEDED FOR WHEEZING 02/08/20   Hennie Duos, MD  diphenhydrAMINE (BENADRYL) 25 MG tablet Take 25 mg by mouth every 6 (six) hours as needed for allergies (allergic reaction).    [provider]  EPINEPHRINE 0.3 mg/0.3 mL IJ SOAJ injection INJECT INTO THE MIDDLE OF THE OUTER THIGH AND HOLD FOR 3 SECONDS AS NEEDED FOR SEVERE ALLERGIC REACTION THEN CALL 911 IF USED. 06/05/20   Laurey Morale, MD  escitalopram (LEXAPRO) 20 MG tablet Take 1 tablet (20 mg total) by mouth daily. 04/01/20   Laurey Morale, MD  montelukast (SINGULAIR) 10 MG tablet Take 10 mg by mouth at bedtime.    [provider]  nebivolol (BYSTOLIC) 10 MG tablet Take 1 tablet (10 mg total) by mouth daily. 01/29/20   Laurey Morale, MD  nitroGLYCERIN (NITROSTAT) 0.4 MG SL tablet Place 1 tablet (0.4 mg total) under the tongue every 5 (five) minutes as needed for chest pain. MAX 3 doses 07/29/17 02/02/18  Almyra Deforest, PA  phentermine 30 MG capsule Take 1 capsule (30 mg total) by mouth every morning. 06/14/19   Laurey Morale, MD  triamcinolone cream (KENALOG) 0.1 % APPLY TO AFFECTED AREA(S) TWO TIMES A DAY TOPICALLY 05/31/20   Laurey Morale, MD  zolpidem (AMBIEN) 10 MG tablet TAKE TWO TABLETS BY MOUTH EVERY NIGHT AT BEDTIME 03/06/20   Laurey Morale, MD    Allergies    Pollen extract, Metoprolol, Mold extract [trichophyton], Oxycodone-acetaminophen, Percocet [oxycodone-acetaminophen], and Lisinopril  Review of Systems   Review of Systems  All other systems reviewed and are negative.   Physical Exam Updated Vital Signs BP (!) 151/100   Pulse 81   Temp 98.2 F (36.8 C) (Oral)   Resp 19   Wt 74.8 kg   SpO2 100%   BMI 27.04 kg/m   Physical Exam Vitals and nursing note reviewed. Exam conducted with a chaperone present.  Constitutional:      General: She is not in acute  distress. HENT:     Head: Normocephalic and atraumatic.  Eyes:     General: No scleral icterus.    Conjunctiva/sclera: Conjunctivae normal.  Cardiovascular:     Rate and Rhythm: Normal rate and regular rhythm.     Pulses: Normal pulses.     Heart sounds: Normal heart sounds.  Pulmonary:     Effort: Pulmonary effort is normal. No respiratory distress.  Breath sounds: Normal breath sounds.  Musculoskeletal:     Comments: Left elbow: ROM limited due to pain and mild inflammation subsequent to dog bite.  Puncture wounds noted over extensor region of elbow.  Mild distal humeral TTP, but compartments are soft. Left shoulder: ROM limited, but no bony TTP or evidence of injury. Left wrist: Puncture wound noted over distal radius.  Flexion extension intact.  Radial pulse intact. Left hand: Puncture wound over thenar eminence near 1st MCP.  Can wiggle fingers.  Assessed radial, ulnar, and median nerves - sensation intact throughout.  Skin:    General: Skin is dry.  Neurological:     Mental Status: She is alert.     GCS: GCS eye subscore is 4. GCS verbal subscore is 5. GCS motor subscore is 6.  Psychiatric:        Mood and Affect: Mood normal.        Behavior: Behavior normal.        Thought Content: Thought content normal.           ED Results / Procedures / Treatments   Labs (all labs ordered are listed, but only abnormal results are displayed) Labs Reviewed - No data to display  EKG None  Radiology DG Elbow Complete Left  Result Date: 06/11/2020 CLINICAL DATA:  Dog bite, pain EXAM: LEFT ELBOW - COMPLETE 3+ VIEW; LEFT WRIST - COMPLETE 3+ VIEW COMPARISON:  None. FINDINGS: Left elbow: Frontal, bilateral oblique, lateral views of the left elbow are obtained. No fracture, subluxation, or dislocation. Joint spaces are well preserved. No joint effusion. No radiopaque foreign body. Left wrist: Frontal, oblique, lateral, and ulnar deviated views of the left wrist are obtained. No  fracture, subluxation, or dislocation. Joint spaces are well preserved. Subcutaneous gas within the radial aspect of the wrist consistent with puncture wound. No radiopaque foreign body. IMPRESSION: 1. No fracture or radiopaque foreign body. 2. Subcutaneous gas left wrist consistent with puncture wound. Electronically Signed   By: Randa Ngo M.D.   On: 06/11/2020 18:48   DG Wrist Complete Left  Result Date: 06/11/2020 CLINICAL DATA:  Dog bite, pain EXAM: LEFT ELBOW - COMPLETE 3+ VIEW; LEFT WRIST - COMPLETE 3+ VIEW COMPARISON:  None. FINDINGS: Left elbow: Frontal, bilateral oblique, lateral views of the left elbow are obtained. No fracture, subluxation, or dislocation. Joint spaces are well preserved. No joint effusion. No radiopaque foreign body. Left wrist: Frontal, oblique, lateral, and ulnar deviated views of the left wrist are obtained. No fracture, subluxation, or dislocation. Joint spaces are well preserved. Subcutaneous gas within the radial aspect of the wrist consistent with puncture wound. No radiopaque foreign body. IMPRESSION: 1. No fracture or radiopaque foreign body. 2. Subcutaneous gas left wrist consistent with puncture wound. Electronically Signed   By: Randa Ngo M.D.   On: 06/11/2020 18:48   DG Knee Complete 4 Views Left  Result Date: 06/11/2020 CLINICAL DATA:  Dog bite, puncture to left knee. EXAM: LEFT KNEE - COMPLETE 4+ VIEW COMPARISON:  None. FINDINGS: No evidence of fracture, dislocation, or joint effusion. No evidence of arthropathy or other focal bone abnormality. Artifact overlying the mid proximal aspect of the lower leg related to overlying clothing. Patient declined to remove clothing. Mild skin irregularity about the lateral aspect of the distal thigh may represent site of injury. No radiopaque foreign body. IMPRESSION: Mild skin irregularity about the lateral aspect of the distal thigh may represent site of puncture wound. No radiopaque foreign body. No osseous  abnormality.  Electronically Signed   By: Keith Rake M.D.   On: 06/11/2020 20:17    Procedures Procedures (including critical care time)  Medications Ordered in ED Medications  rabies immune globulin (HYPERAB/KEDRAB) injection 1,500 Units (has no administration in time range)  rabies vaccine (RABAVERT) injection 1 mL (has no administration in time range)  amoxicillin-clavulanate (AUGMENTIN) 875-125 MG per tablet 1 tablet (has no administration in time range)  ketorolac (TORADOL) injection 60 mg (60 mg Intramuscular Given 06/11/20 1832)  Tdap (BOOSTRIX) injection 0.5 mL (0.5 mLs Intramuscular Given 06/11/20 1833)    ED Course  I have reviewed the triage vital signs and the nursing notes.  Pertinent labs & imaging results that were available during my care of the patient were reviewed by me and considered in my medical decision making (see chart for details).  Clinical Course as of Jun 11 2054  Tue Jun 11, 2020  2030 44 yo female here with multiple small puncture wounds from a small dog bites earlier.  She does not know dog's vaccine status.  She has small puncture wounds to the left arm involving the wrist and forearm and the left knee.   [MT]    Clinical Course User Index [MT] Trifan, Carola Rhine, MD   MDM Rules/Calculators/A&P                          Patient presents to the ED after sustaining animal bites.  We will update patient's tetanus here and obtain imaging to rule out foreign body or acute osseous abnormalities.  Given that animal control has not been contacted, nor [employer], and that they do not believe the dog has been detained, we discussed rabies treatment and they would like to proceed.  Discussed risks and benefits and they are agreeable.  Wounds will be copiously irrigated here in the ED before they are treated and they do not require repair.  Patient to be discharged home with Augmentin x 7 days.  Patient was personally evaluated by Dr. Langston Masker who agrees with assessment  and plan.  Day 0: 06/11/2020 --rabies vaccine and rabies immunoglobulin Day 3: 06/14/2020 --rabies vaccine Day 7: 06/18/2020 --rabies vaccine Day 14: 06/25/2020 --rabies vaccine  All of the evaluation and work-up results were discussed with the patient and any family at bedside.  Patient and/or family were informed that while patient is appropriate for discharge at this time, some medical emergencies may only develop or become detectable after a period of time.  I specifically instructed patient and/or family to return to return to the ED or seek immediate medical attention for any new or worsening symptoms.  They were provided opportunity to ask any additional questions and have none at this time.  Prior to discharge patient is feeling well, agreeable with plan for discharge home.  They have expressed understanding of verbal discharge instructions as well as return precautions and are agreeable to the plan.    Final Clinical Impression(s) / ED Diagnoses Final diagnoses:  Dog bite, initial encounter    Rx / DC Orders ED Discharge Orders         Ordered    amoxicillin-clavulanate (AUGMENTIN) 875-125 MG tablet  Every 12 hours     Discontinue  Reprint     06/11/20 2004           Corena Herter, PA-C 06/11/20 2055    Wyvonnia Dusky, MD 06/12/20 [phone]

## 2020-06-24 ENCOUNTER — Telehealth: Payer: Self-pay | Admitting: Family Medicine

## 2020-06-24 NOTE — Telephone Encounter (Signed)
Pt is calling in stating that she has not been on Rx nebivolol (BYSTOLIC) 10 MG for about a month due to the cost.  She had checked with her insurance and they are giving the pt the run around about the Rx and last that she heard anything they suggested that she get a new script sent to [facility] on [address]. Pt really prefers to go to [facility].  Pt stated that she would like to know what you suggest while she is waiting on the insurance to give her a answer she stated that she has changed her diet (no salt, fried food, sodas and lots of water), taking a multi vitamin and has eliminated medication that she feel may interact with.  Pt would like to have call.

## 2020-06-25 NOTE — Telephone Encounter (Signed)
Has she tried [facility]?

## 2020-06-25 NOTE — Telephone Encounter (Signed)
Spoke with the patient. She is aware to try a good rx discount card.

## 2020-08-02 DIAGNOSIS — Z20822 Contact with and (suspected) exposure to covid-19: Secondary | ICD-10-CM | POA: Diagnosis not present

## 2020-08-12 ENCOUNTER — Encounter: Payer: Self-pay | Admitting: Family Medicine

## 2020-08-14 MED ORDER — PHENTERMINE HCL 30 MG PO CAPS
30.0000 mg | ORAL_CAPSULE | ORAL | 1 refills | Status: DC
Start: 2020-08-14 — End: 2021-02-05

## 2020-08-14 NOTE — Telephone Encounter (Signed)
It was Phentermine and I sent in refills for this

## 2020-08-26 ENCOUNTER — Other Ambulatory Visit: Payer: Self-pay | Admitting: Family Medicine

## 2020-08-31 ENCOUNTER — Other Ambulatory Visit: Payer: Self-pay | Admitting: Family Medicine

## 2020-09-25 ENCOUNTER — Other Ambulatory Visit: Payer: Self-pay | Admitting: Family Medicine

## 2020-09-30 ENCOUNTER — Encounter: Payer: Self-pay | Admitting: Family Medicine

## 2020-10-01 NOTE — Telephone Encounter (Signed)
I sent this in yesterday

## 2020-10-21 ENCOUNTER — Other Ambulatory Visit: Payer: Self-pay | Admitting: Family Medicine

## 2020-11-24 ENCOUNTER — Other Ambulatory Visit: Payer: Self-pay | Admitting: Dermatology

## 2020-12-08 ENCOUNTER — Encounter: Payer: Self-pay | Admitting: Family Medicine

## 2020-12-09 NOTE — Telephone Encounter (Signed)
Set up an OV for [place] to talk about it

## 2020-12-10 ENCOUNTER — Encounter: Payer: Self-pay | Admitting: Family Medicine

## 2020-12-10 ENCOUNTER — Ambulatory Visit (INDEPENDENT_AMBULATORY_CARE_PROVIDER_SITE_OTHER): Payer: BC Managed Care – PPO | Admitting: Family Medicine

## 2020-12-10 ENCOUNTER — Other Ambulatory Visit: Payer: Self-pay

## 2020-12-10 VITALS — BP 152/94 | HR 80 | Temp 98.1°F | Ht 65.5 in | Wt 188.2 lb

## 2020-12-10 DIAGNOSIS — I10 Essential (primary) hypertension: Secondary | ICD-10-CM

## 2020-12-10 DIAGNOSIS — F411 Generalized anxiety disorder: Secondary | ICD-10-CM

## 2020-12-10 MED ORDER — NEBIVOLOL HCL 10 MG PO TABS: 10.0000 mg | ORAL_TABLET | Freq: Every day | ORAL | 3 refills | Status: DC

## 2020-12-10 NOTE — Progress Notes (Signed)
   Subjective:    Patient ID: Zoe Swanson, female    DOB: [birth year], 45 y.o.   MRN: [id number]  HPI Here to follow up on  HTN and anxiety.  She had been doing well but she stopped taking Bystolic about a month ago. Then her heart rate went up and her BP went up she started back on Bystolic about 3 days ago. Her anxiety has been under better control since she quit her job in October. She is meeting with a therapist regularly.     Review of Systems  Constitutional: Negative.   Respiratory: Negative.   Cardiovascular: Negative.   Neurological: Negative.   Psychiatric/Behavioral: Negative for dysphoric mood. The patient is nervous/anxious.        Objective:   Physical Exam Constitutional:      Appearance: Normal appearance.  Cardiovascular:     Rate and Rhythm: Normal rate and regular rhythm.     Pulses: Normal pulses.     Heart sounds: Normal heart sounds.  Pulmonary:     Effort: Pulmonary effort is normal.     Breath sounds: Normal breath sounds.  Neurological:     Mental Status: She is alert.  Psychiatric:        Mood and Affect: Mood normal.        Thought Content: Thought content normal.           Assessment & Plan:  She seems to be doing well as long as she takes her medications as prescribed. Her HTN and anxiety are stable. She will see her GYN next week for an exam and labs. Alysia Penna, MD

## 2020-12-30 DIAGNOSIS — S93492A Sprain of other ligament of left ankle, initial encounter: Secondary | ICD-10-CM | POA: Diagnosis not present

## 2020-12-30 DIAGNOSIS — G90522 Complex regional pain syndrome I of left lower limb: Secondary | ICD-10-CM | POA: Diagnosis not present

## 2021-01-21 ENCOUNTER — Other Ambulatory Visit: Payer: Self-pay | Admitting: Dermatology

## 2021-02-04 ENCOUNTER — Other Ambulatory Visit: Payer: Self-pay | Admitting: Family Medicine

## 2021-02-04 NOTE — Telephone Encounter (Signed)
Last refill-08/14/2020 Last office visit- 12/07/2020  No future visit has been scheduled

## 2021-02-10 DIAGNOSIS — Z1231 Encounter for screening mammogram for malignant neoplasm of breast: Secondary | ICD-10-CM | POA: Diagnosis not present

## 2021-02-20 DIAGNOSIS — F411 Generalized anxiety disorder: Secondary | ICD-10-CM | POA: Diagnosis not present

## 2021-02-20 DIAGNOSIS — Z01419 Encounter for gynecological examination (general) (routine) without abnormal findings: Secondary | ICD-10-CM | POA: Diagnosis not present

## 2021-02-20 DIAGNOSIS — Z6829 Body mass index (BMI) 29.0-29.9, adult: Secondary | ICD-10-CM | POA: Diagnosis not present

## 2021-02-24 ENCOUNTER — Other Ambulatory Visit: Payer: Self-pay | Admitting: Family Medicine

## 2021-03-20 DIAGNOSIS — H5213 Myopia, bilateral: Secondary | ICD-10-CM | POA: Diagnosis not present

## 2021-03-22 ENCOUNTER — Other Ambulatory Visit: Payer: Self-pay | Admitting: Dermatology

## 2021-03-24 ENCOUNTER — Other Ambulatory Visit: Payer: Self-pay | Admitting: Family Medicine

## 2021-03-25 NOTE — Telephone Encounter (Signed)
Pt LOV was on 12/10/2020 Last refill done on 09/30/2020 for 120 tablets with 5 refills Please advise

## 2021-04-14 ENCOUNTER — Other Ambulatory Visit: Payer: Self-pay | Admitting: Family Medicine

## 2021-06-25 ENCOUNTER — Other Ambulatory Visit: Payer: Self-pay | Admitting: Family Medicine

## 2021-07-28 ENCOUNTER — Telehealth: Payer: Self-pay

## 2021-07-28 NOTE — Telephone Encounter (Signed)
Called cell number, mail box was full.   Called home number, mail box hasn't been set up.   Will try again at another time.

## 2021-07-28 NOTE — Telephone Encounter (Signed)
Patient called requesting a call back to discuss health, and locating a Dr. in [facility].

## 2021-07-29 NOTE — Telephone Encounter (Signed)
Called patient call number [phone], mail box was full.

## 2021-07-31 ENCOUNTER — Other Ambulatory Visit: Payer: Self-pay | Admitting: Family Medicine

## 2021-08-04 ENCOUNTER — Encounter (INDEPENDENT_AMBULATORY_CARE_PROVIDER_SITE_OTHER): Payer: Self-pay

## 2021-08-05 ENCOUNTER — Telehealth: Payer: Self-pay

## 2021-08-05 MED ORDER — ZOLPIDEM TARTRATE 10 MG PO TABS: ORAL_TABLET | ORAL | 5 refills | Status: DC

## 2021-08-05 NOTE — Addendum Note (Signed)
Addended by: Alysia Penna A on: 08/05/2021 05:12 PM   Modules accepted: Orders

## 2021-08-05 NOTE — Telephone Encounter (Signed)
Done

## 2021-08-05 NOTE — Telephone Encounter (Signed)
Message was sent to Dr Fry for advise 

## 2021-08-06 DIAGNOSIS — J4540 Moderate persistent asthma, uncomplicated: Secondary | ICD-10-CM | POA: Diagnosis not present

## 2021-08-06 DIAGNOSIS — N926 Irregular menstruation, unspecified: Secondary | ICD-10-CM | POA: Diagnosis not present

## 2021-08-06 DIAGNOSIS — I10 Essential (primary) hypertension: Secondary | ICD-10-CM | POA: Diagnosis not present

## 2021-08-06 DIAGNOSIS — D509 Iron deficiency anemia, unspecified: Secondary | ICD-10-CM | POA: Diagnosis not present

## 2021-08-06 NOTE — Telephone Encounter (Signed)
Spoke with patient to inform prescription for Ambien has been sent to [facility] on [address].    Patient is currently in [place], she stated [facility] will [facility] Kristopher Oppenheim to get prescription transferred.    Requested to cancel appointment on 08/08/21, due to being out of town.

## 2021-08-08 ENCOUNTER — Ambulatory Visit: Payer: BC Managed Care – PPO | Admitting: Family Medicine

## 2021-08-13 DIAGNOSIS — N926 Irregular menstruation, unspecified: Secondary | ICD-10-CM | POA: Diagnosis not present

## 2021-08-13 DIAGNOSIS — Z131 Encounter for screening for diabetes mellitus: Secondary | ICD-10-CM | POA: Diagnosis not present

## 2021-08-13 DIAGNOSIS — I10 Essential (primary) hypertension: Secondary | ICD-10-CM | POA: Diagnosis not present

## 2021-08-13 DIAGNOSIS — Z1159 Encounter for screening for other viral diseases: Secondary | ICD-10-CM | POA: Diagnosis not present

## 2021-08-13 DIAGNOSIS — Z13220 Encounter for screening for lipoid disorders: Secondary | ICD-10-CM | POA: Diagnosis not present

## 2021-08-25 DIAGNOSIS — N926 Irregular menstruation, unspecified: Secondary | ICD-10-CM | POA: Diagnosis not present

## 2021-08-25 DIAGNOSIS — E785 Hyperlipidemia, unspecified: Secondary | ICD-10-CM | POA: Diagnosis not present

## 2021-08-25 DIAGNOSIS — R319 Hematuria, unspecified: Secondary | ICD-10-CM | POA: Diagnosis not present

## 2021-08-25 DIAGNOSIS — D259 Leiomyoma of uterus, unspecified: Secondary | ICD-10-CM | POA: Diagnosis not present

## 2021-08-26 DIAGNOSIS — D122 Benign neoplasm of ascending colon: Secondary | ICD-10-CM | POA: Diagnosis not present

## 2021-08-26 DIAGNOSIS — Z885 Allergy status to narcotic agent status: Secondary | ICD-10-CM | POA: Diagnosis not present

## 2021-08-26 DIAGNOSIS — Z1211 Encounter for screening for malignant neoplasm of colon: Secondary | ICD-10-CM | POA: Diagnosis not present

## 2021-08-26 DIAGNOSIS — K640 First degree hemorrhoids: Secondary | ICD-10-CM | POA: Diagnosis not present

## 2021-08-26 DIAGNOSIS — K635 Polyp of colon: Secondary | ICD-10-CM | POA: Diagnosis not present

## 2021-08-26 DIAGNOSIS — J45909 Unspecified asthma, uncomplicated: Secondary | ICD-10-CM | POA: Diagnosis not present

## 2021-08-26 DIAGNOSIS — K644 Residual hemorrhoidal skin tags: Secondary | ICD-10-CM | POA: Diagnosis not present

## 2021-08-26 DIAGNOSIS — Z79899 Other long term (current) drug therapy: Secondary | ICD-10-CM | POA: Diagnosis not present

## 2021-08-26 DIAGNOSIS — Z800 Family history of malignant neoplasm of digestive organs: Secondary | ICD-10-CM | POA: Diagnosis not present

## 2021-08-26 HISTORY — PX: COLONOSCOPY: SHX174

## 2021-08-27 DIAGNOSIS — K6389 Other specified diseases of intestine: Secondary | ICD-10-CM | POA: Diagnosis not present

## 2021-09-01 DIAGNOSIS — Z6828 Body mass index (BMI) 28.0-28.9, adult: Secondary | ICD-10-CM | POA: Diagnosis not present

## 2021-09-01 DIAGNOSIS — E663 Overweight: Secondary | ICD-10-CM | POA: Diagnosis not present

## 2021-09-01 DIAGNOSIS — J4540 Moderate persistent asthma, uncomplicated: Secondary | ICD-10-CM | POA: Diagnosis not present

## 2021-09-01 DIAGNOSIS — N182 Chronic kidney disease, stage 2 (mild): Secondary | ICD-10-CM | POA: Diagnosis not present

## 2021-09-01 DIAGNOSIS — I10 Essential (primary) hypertension: Secondary | ICD-10-CM | POA: Diagnosis not present

## 2021-09-01 DIAGNOSIS — F419 Anxiety disorder, unspecified: Secondary | ICD-10-CM | POA: Diagnosis not present

## 2021-09-02 ENCOUNTER — Other Ambulatory Visit: Payer: Self-pay | Admitting: Family Medicine

## 2021-09-08 DIAGNOSIS — E785 Hyperlipidemia, unspecified: Secondary | ICD-10-CM | POA: Diagnosis not present

## 2021-09-08 DIAGNOSIS — D259 Leiomyoma of uterus, unspecified: Secondary | ICD-10-CM | POA: Diagnosis not present

## 2021-09-08 DIAGNOSIS — N939 Abnormal uterine and vaginal bleeding, unspecified: Secondary | ICD-10-CM | POA: Diagnosis not present

## 2021-09-08 DIAGNOSIS — N926 Irregular menstruation, unspecified: Secondary | ICD-10-CM | POA: Diagnosis not present

## 2021-09-11 DIAGNOSIS — I10 Essential (primary) hypertension: Secondary | ICD-10-CM | POA: Diagnosis not present

## 2021-09-11 DIAGNOSIS — N1831 Chronic kidney disease, stage 3a: Secondary | ICD-10-CM | POA: Diagnosis not present

## 2021-09-15 ENCOUNTER — Telehealth: Payer: Self-pay | Admitting: Family Medicine

## 2021-09-15 ENCOUNTER — Ambulatory Visit: Payer: BC Managed Care – PPO | Admitting: Family Medicine

## 2021-09-15 NOTE — Telephone Encounter (Signed)
Patient needs a refill on [place]

## 2021-09-15 NOTE — Telephone Encounter (Signed)
Looks like there should be one refill left.   Had Rx 03-26-21 for #120 with 5 refills.   Reviewed PDMP and only filled 5 times thus far.

## 2021-09-15 NOTE — Telephone Encounter (Signed)
Dr. Barbie Banner patient  Last refill-03/26/21--120 tabs, 5 refills Last office visit-12/10/20  Next office visit- 10/15/21,  Can this patient receive a refill?

## 2021-09-16 NOTE — Telephone Encounter (Signed)
Called patient, she stated she will  call pharmacy to have medication refilled.

## 2021-09-17 ENCOUNTER — Telehealth: Payer: Self-pay

## 2021-09-17 NOTE — Telephone Encounter (Signed)
Patient called back stating pharmacy is stating they don't have Rx at all and would like someone to call the pharmacy ALPRAZolam Duanne Moron) 1 MG tablet

## 2021-09-17 NOTE — Telephone Encounter (Signed)
Message from 09/16/21 has been resent to Dr. Elease Hashimoto requesting refill.

## 2021-09-17 NOTE — Telephone Encounter (Signed)
Patient called into office today stating that Zoe Swanson no longer has prescription on file for Alprazolam 1mg .    I called [facility] and spoke with the pharmacy tech,   she stated that the last refill on file for Alprazolam 1mg  [facility]     Please submit refill to [facility] on [address].

## 2021-09-22 MED ORDER — ALPRAZOLAM 1 MG PO TABS: 1.0000 mg | ORAL_TABLET | Freq: Four times a day (QID) | ORAL | 0 refills | Status: DC | PRN

## 2021-09-22 NOTE — Telephone Encounter (Signed)
Message complete.  See previous message

## 2021-09-22 NOTE — Addendum Note (Signed)
Addended by: Eulas Post on: 09/22/2021 01:05 PM   Modules accepted: Orders

## 2021-09-22 NOTE — Telephone Encounter (Signed)
I did 1 refill until Dr. Sarajane Jews returns

## 2021-09-22 NOTE — Telephone Encounter (Signed)
Message complete

## 2021-09-24 DIAGNOSIS — K8020 Calculus of gallbladder without cholecystitis without obstruction: Secondary | ICD-10-CM | POA: Diagnosis not present

## 2021-09-24 DIAGNOSIS — N1831 Chronic kidney disease, stage 3a: Secondary | ICD-10-CM | POA: Diagnosis not present

## 2021-09-25 ENCOUNTER — Other Ambulatory Visit: Payer: Self-pay | Admitting: Family Medicine

## 2021-09-25 DIAGNOSIS — N1831 Chronic kidney disease, stage 3a: Secondary | ICD-10-CM | POA: Diagnosis not present

## 2021-09-30 DIAGNOSIS — I10 Essential (primary) hypertension: Secondary | ICD-10-CM | POA: Diagnosis not present

## 2021-09-30 DIAGNOSIS — N1831 Chronic kidney disease, stage 3a: Secondary | ICD-10-CM | POA: Diagnosis not present

## 2021-09-30 DIAGNOSIS — K8020 Calculus of gallbladder without cholecystitis without obstruction: Secondary | ICD-10-CM | POA: Diagnosis not present

## 2021-09-30 DIAGNOSIS — R339 Retention of urine, unspecified: Secondary | ICD-10-CM | POA: Diagnosis not present

## 2021-10-15 ENCOUNTER — Encounter: Payer: Self-pay | Admitting: Family Medicine

## 2021-10-15 ENCOUNTER — Ambulatory Visit (INDEPENDENT_AMBULATORY_CARE_PROVIDER_SITE_OTHER): Payer: BC Managed Care – PPO | Admitting: Family Medicine

## 2021-10-15 VITALS — BP 140/82 | HR 78 | Temp 98.7°F | Wt 171.0 lb

## 2021-10-15 DIAGNOSIS — J3089 Other allergic rhinitis: Secondary | ICD-10-CM | POA: Insufficient documentation

## 2021-10-15 DIAGNOSIS — N183 Chronic kidney disease, stage 3 unspecified: Secondary | ICD-10-CM | POA: Insufficient documentation

## 2021-10-15 DIAGNOSIS — J4540 Moderate persistent asthma, uncomplicated: Secondary | ICD-10-CM

## 2021-10-15 DIAGNOSIS — F411 Generalized anxiety disorder: Secondary | ICD-10-CM

## 2021-10-15 DIAGNOSIS — I10 Essential (primary) hypertension: Secondary | ICD-10-CM | POA: Diagnosis not present

## 2021-10-15 DIAGNOSIS — N1831 Chronic kidney disease, stage 3a: Secondary | ICD-10-CM

## 2021-10-15 MED ORDER — MONTELUKAST SODIUM 10 MG PO TABS: 10.0000 mg | ORAL_TABLET | Freq: Every day | ORAL | 3 refills | Status: DC

## 2021-10-15 MED ORDER — EPINEPHRINE 0.3 MG/0.3ML IJ SOAJ: INTRAMUSCULAR | 5 refills | Status: DC

## 2021-10-15 MED ORDER — ALBUTEROL SULFATE (2.5 MG/3ML) 0.083% IN NEBU: 2.5000 mg | INHALATION_SOLUTION | RESPIRATORY_TRACT | 5 refills | Status: DC | PRN

## 2021-10-15 MED ORDER — COMBIVENT RESPIMAT 20-100 MCG/ACT IN AERS: INHALATION_SPRAY | RESPIRATORY_TRACT | 10 refills | Status: DC

## 2021-10-15 MED ORDER — NITROGLYCERIN 0.4 MG SL SUBL
0.4000 mg | SUBLINGUAL_TABLET | SUBLINGUAL | 5 refills | Status: DC | PRN
Start: 2021-10-15 — End: 2024-05-17

## 2021-10-15 MED ORDER — ALBUTEROL SULFATE HFA 108 (90 BASE) MCG/ACT IN AERS
2.0000 | INHALATION_SPRAY | RESPIRATORY_TRACT | 11 refills | Status: DC | PRN
Start: 2021-10-15 — End: 2024-08-16

## 2021-10-15 MED ORDER — ALPRAZOLAM 1 MG PO TABS: 1.0000 mg | ORAL_TABLET | Freq: Four times a day (QID) | ORAL | 5 refills | Status: DC | PRN

## 2021-10-15 NOTE — Progress Notes (Signed)
° °  Subjective:    Patient ID: Zoe Swanson, female    DOB: 02/02/1976, 45 y.o.   MRN: [id number]  HPI Here to follow up on issues. She is currently spending most of her time in [place], [place] for her job. She is here to spend a few weeks with her family for the holidays. She has been diagnosed with CKD stage 3A, and she sees a nephrologist in [facility] named Dr. Timoteo Expose. She has recently had labs and a renal US performed there, and she plans to forward these results to [place]. She also had a colonoscopy last month where a single polyp was removed. A 5 year repeat was recommended. Her anxiety is stable. She took Lexapro for a few months earlier this year, but now she has stopped this. She is currently using Xanax as needed. Her asthma and allergies have been well controlled, but she needs refills of many medications.    Review of Systems  Constitutional: Negative.   Respiratory: Negative.    Cardiovascular: Negative.   Gastrointestinal: Negative.   Genitourinary: Negative.   Psychiatric/Behavioral:  Negative for agitation, confusion, decreased concentration, dysphoric mood, hallucinations and sleep disturbance. The patient is nervous/anxious.       Objective:   Physical Exam Constitutional:      Appearance: Normal appearance.  Cardiovascular:     Rate and Rhythm: Normal rate and regular rhythm.     Pulses: Normal pulses.     Heart sounds: Normal heart sounds.  Pulmonary:     Effort: Pulmonary effort is normal.     Breath sounds: Normal breath sounds.  Neurological:     Mental Status: She is alert.  Psychiatric:        Mood and Affect: Mood normal.        Behavior: Behavior normal.        Thought Content: Thought content normal.          Assessment & Plan:  Her HTN is stable. Her allergies ans asthma are well controlled. We refilled her medications. She has CKD now which seems to be stable, and she is followed by Nephrology in [place], [place]. I asked that she have  copies of her records, test results, etc sent to [place]. Her anxiety is well controlled as well. We spent a total of (   ) minutes reviewing records and discussing these issues.  Alysia Penna, MD

## 2021-11-06 ENCOUNTER — Telehealth: Payer: Self-pay | Admitting: Family Medicine

## 2021-11-06 NOTE — Telephone Encounter (Signed)
Pt is calling and last seen dr fry on 10-14-2021. Pt is no longer using  neuro stimulator for her back and would like new rx for muscle relaxer  [id number] [place] Phone:  [phone]  Fax:  [phone]

## 2021-11-06 NOTE — Telephone Encounter (Signed)
Pharmacy updated.

## 2021-11-07 ENCOUNTER — Other Ambulatory Visit: Payer: Self-pay

## 2021-11-07 DIAGNOSIS — M5416 Radiculopathy, lumbar region: Secondary | ICD-10-CM

## 2021-11-07 MED ORDER — CYCLOBENZAPRINE HCL 10 MG PO TABS: 10.0000 mg | ORAL_TABLET | Freq: Three times a day (TID) | ORAL | 5 refills | Status: DC | PRN

## 2021-11-07 NOTE — Telephone Encounter (Signed)
Call in Flexeril 10 mg to take TID as needed for muscle spasms, #90 with 5 rf  

## 2021-11-07 NOTE — Telephone Encounter (Signed)
Called patient to inform that Flexeril 10mg  has been sent to the pharmacy.

## 2021-11-18 ENCOUNTER — Other Ambulatory Visit: Payer: Self-pay | Admitting: Family Medicine

## 2021-11-20 NOTE — Telephone Encounter (Signed)
Last OV0 10/15/21 Phentermine 30mg  not listed on medication list.  Can this patient receive a refill?

## 2021-11-26 DIAGNOSIS — K8020 Calculus of gallbladder without cholecystitis without obstruction: Secondary | ICD-10-CM | POA: Diagnosis not present

## 2021-11-26 DIAGNOSIS — Z01818 Encounter for other preprocedural examination: Secondary | ICD-10-CM | POA: Diagnosis not present

## 2021-11-26 DIAGNOSIS — N182 Chronic kidney disease, stage 2 (mild): Secondary | ICD-10-CM | POA: Diagnosis not present

## 2021-11-26 DIAGNOSIS — I10 Essential (primary) hypertension: Secondary | ICD-10-CM | POA: Diagnosis not present

## 2021-11-26 DIAGNOSIS — D259 Leiomyoma of uterus, unspecified: Secondary | ICD-10-CM | POA: Diagnosis not present

## 2021-12-02 ENCOUNTER — Other Ambulatory Visit: Payer: Self-pay | Admitting: Family Medicine

## 2021-12-02 ENCOUNTER — Other Ambulatory Visit: Payer: Self-pay

## 2021-12-02 ENCOUNTER — Telehealth: Payer: Self-pay | Admitting: Family Medicine

## 2021-12-02 MED ORDER — ALPRAZOLAM 1 MG PO TABS: 1.0000 mg | ORAL_TABLET | Freq: Four times a day (QID) | ORAL | 5 refills | Status: DC | PRN

## 2021-12-02 NOTE — Telephone Encounter (Signed)
Done

## 2021-12-02 NOTE — Telephone Encounter (Signed)
Last refill-10/15/21--refills currently on file at [facility] in [place].   [facility] [place]   Pharmacy updated.  Last OV- 10/15/21

## 2021-12-02 NOTE — Telephone Encounter (Signed)
Pharmacy will notify patient when ready for pickup 

## 2021-12-02 NOTE — Telephone Encounter (Signed)
Patient is requesting a refill for ALPRAZolam Zoe Swanson) 1 MG tablet [[id number]]  to be sent to her pharmacy.  [id number] [place], [place], [place] [address]   Please advise.

## 2021-12-09 DIAGNOSIS — M2392 Unspecified internal derangement of left knee: Secondary | ICD-10-CM | POA: Diagnosis not present

## 2021-12-09 DIAGNOSIS — M25562 Pain in left knee: Secondary | ICD-10-CM | POA: Diagnosis not present

## 2021-12-10 DIAGNOSIS — D252 Subserosal leiomyoma of uterus: Secondary | ICD-10-CM | POA: Diagnosis not present

## 2021-12-10 DIAGNOSIS — J4540 Moderate persistent asthma, uncomplicated: Secondary | ICD-10-CM | POA: Diagnosis not present

## 2021-12-10 DIAGNOSIS — K8020 Calculus of gallbladder without cholecystitis without obstruction: Secondary | ICD-10-CM | POA: Diagnosis not present

## 2021-12-10 DIAGNOSIS — I129 Hypertensive chronic kidney disease with stage 1 through stage 4 chronic kidney disease, or unspecified chronic kidney disease: Secondary | ICD-10-CM | POA: Diagnosis not present

## 2021-12-10 DIAGNOSIS — E663 Overweight: Secondary | ICD-10-CM | POA: Diagnosis not present

## 2021-12-10 DIAGNOSIS — F419 Anxiety disorder, unspecified: Secondary | ICD-10-CM | POA: Diagnosis not present

## 2021-12-10 DIAGNOSIS — D251 Intramural leiomyoma of uterus: Secondary | ICD-10-CM | POA: Diagnosis not present

## 2021-12-10 DIAGNOSIS — R300 Dysuria: Secondary | ICD-10-CM | POA: Diagnosis not present

## 2021-12-10 DIAGNOSIS — N72 Inflammatory disease of cervix uteri: Secondary | ICD-10-CM | POA: Diagnosis not present

## 2021-12-10 DIAGNOSIS — E785 Hyperlipidemia, unspecified: Secondary | ICD-10-CM | POA: Diagnosis not present

## 2021-12-10 DIAGNOSIS — D250 Submucous leiomyoma of uterus: Secondary | ICD-10-CM | POA: Diagnosis not present

## 2021-12-10 DIAGNOSIS — N926 Irregular menstruation, unspecified: Secondary | ICD-10-CM | POA: Diagnosis not present

## 2021-12-10 DIAGNOSIS — D649 Anemia, unspecified: Secondary | ICD-10-CM | POA: Diagnosis not present

## 2021-12-10 DIAGNOSIS — N838 Other noninflammatory disorders of ovary, fallopian tube and broad ligament: Secondary | ICD-10-CM | POA: Diagnosis not present

## 2021-12-10 DIAGNOSIS — E559 Vitamin D deficiency, unspecified: Secondary | ICD-10-CM | POA: Diagnosis not present

## 2021-12-10 DIAGNOSIS — N183 Chronic kidney disease, stage 3 unspecified: Secondary | ICD-10-CM | POA: Diagnosis not present

## 2021-12-11 DIAGNOSIS — R300 Dysuria: Secondary | ICD-10-CM | POA: Diagnosis not present

## 2021-12-11 DIAGNOSIS — N939 Abnormal uterine and vaginal bleeding, unspecified: Secondary | ICD-10-CM | POA: Diagnosis not present

## 2021-12-11 DIAGNOSIS — Z01818 Encounter for other preprocedural examination: Secondary | ICD-10-CM | POA: Diagnosis not present

## 2021-12-11 DIAGNOSIS — D259 Leiomyoma of uterus, unspecified: Secondary | ICD-10-CM | POA: Diagnosis not present

## 2021-12-24 DIAGNOSIS — E559 Vitamin D deficiency, unspecified: Secondary | ICD-10-CM | POA: Diagnosis not present

## 2021-12-24 DIAGNOSIS — E785 Hyperlipidemia, unspecified: Secondary | ICD-10-CM | POA: Diagnosis not present

## 2021-12-24 DIAGNOSIS — N838 Other noninflammatory disorders of ovary, fallopian tube and broad ligament: Secondary | ICD-10-CM | POA: Diagnosis not present

## 2021-12-24 DIAGNOSIS — F419 Anxiety disorder, unspecified: Secondary | ICD-10-CM | POA: Diagnosis not present

## 2021-12-24 DIAGNOSIS — J4540 Moderate persistent asthma, uncomplicated: Secondary | ICD-10-CM | POA: Diagnosis not present

## 2021-12-24 DIAGNOSIS — D250 Submucous leiomyoma of uterus: Secondary | ICD-10-CM | POA: Diagnosis not present

## 2021-12-24 DIAGNOSIS — N183 Chronic kidney disease, stage 3 unspecified: Secondary | ICD-10-CM | POA: Diagnosis not present

## 2021-12-24 DIAGNOSIS — I129 Hypertensive chronic kidney disease with stage 1 through stage 4 chronic kidney disease, or unspecified chronic kidney disease: Secondary | ICD-10-CM | POA: Diagnosis not present

## 2021-12-24 DIAGNOSIS — D251 Intramural leiomyoma of uterus: Secondary | ICD-10-CM | POA: Diagnosis not present

## 2021-12-24 DIAGNOSIS — N939 Abnormal uterine and vaginal bleeding, unspecified: Secondary | ICD-10-CM | POA: Diagnosis not present

## 2021-12-24 DIAGNOSIS — E663 Overweight: Secondary | ICD-10-CM | POA: Diagnosis not present

## 2021-12-24 DIAGNOSIS — D649 Anemia, unspecified: Secondary | ICD-10-CM | POA: Diagnosis not present

## 2021-12-24 DIAGNOSIS — G8918 Other acute postprocedural pain: Secondary | ICD-10-CM | POA: Diagnosis not present

## 2021-12-24 DIAGNOSIS — N72 Inflammatory disease of cervix uteri: Secondary | ICD-10-CM | POA: Diagnosis not present

## 2021-12-24 DIAGNOSIS — R300 Dysuria: Secondary | ICD-10-CM | POA: Diagnosis not present

## 2021-12-24 DIAGNOSIS — D252 Subserosal leiomyoma of uterus: Secondary | ICD-10-CM | POA: Diagnosis not present

## 2021-12-24 DIAGNOSIS — K8020 Calculus of gallbladder without cholecystitis without obstruction: Secondary | ICD-10-CM | POA: Diagnosis not present

## 2021-12-24 DIAGNOSIS — N926 Irregular menstruation, unspecified: Secondary | ICD-10-CM | POA: Diagnosis not present

## 2021-12-27 DIAGNOSIS — D250 Submucous leiomyoma of uterus: Secondary | ICD-10-CM | POA: Diagnosis not present

## 2021-12-27 DIAGNOSIS — D251 Intramural leiomyoma of uterus: Secondary | ICD-10-CM | POA: Diagnosis not present

## 2021-12-29 DIAGNOSIS — R3911 Hesitancy of micturition: Secondary | ICD-10-CM | POA: Diagnosis not present

## 2022-01-20 ENCOUNTER — Other Ambulatory Visit: Payer: Self-pay | Admitting: Family Medicine

## 2022-03-18 ENCOUNTER — Other Ambulatory Visit: Payer: Self-pay | Admitting: Family Medicine

## 2022-04-06 DIAGNOSIS — H3551 Vitreoretinal dystrophy: Secondary | ICD-10-CM | POA: Diagnosis not present

## 2022-04-06 DIAGNOSIS — H2513 Age-related nuclear cataract, bilateral: Secondary | ICD-10-CM | POA: Diagnosis not present

## 2022-04-15 ENCOUNTER — Other Ambulatory Visit: Payer: Self-pay | Admitting: Family Medicine

## 2022-05-28 ENCOUNTER — Other Ambulatory Visit: Payer: Self-pay | Admitting: Family Medicine

## 2022-05-28 DIAGNOSIS — M5416 Radiculopathy, lumbar region: Secondary | ICD-10-CM

## 2022-06-05 DIAGNOSIS — Z8709 Personal history of other diseases of the respiratory system: Secondary | ICD-10-CM | POA: Diagnosis not present

## 2022-06-05 DIAGNOSIS — J301 Allergic rhinitis due to pollen: Secondary | ICD-10-CM | POA: Diagnosis not present

## 2022-06-05 DIAGNOSIS — J3089 Other allergic rhinitis: Secondary | ICD-10-CM | POA: Diagnosis not present

## 2022-06-05 DIAGNOSIS — Z91038 Other insect allergy status: Secondary | ICD-10-CM | POA: Diagnosis not present

## 2022-06-05 DIAGNOSIS — R0689 Other abnormalities of breathing: Secondary | ICD-10-CM | POA: Diagnosis not present

## 2022-06-08 ENCOUNTER — Telehealth: Payer: Self-pay | Admitting: Family Medicine

## 2022-06-08 NOTE — Telephone Encounter (Signed)
Requesting refill of ALPRAZolam Duanne Moron) 1 MG tablet sent to  [id number] - [place] - [address] AT [address] [place] Phone:  [phone]  Fax:  [phone]    Pt states she has 3 days left.

## 2022-06-09 DIAGNOSIS — R109 Unspecified abdominal pain: Secondary | ICD-10-CM | POA: Diagnosis not present

## 2022-06-09 DIAGNOSIS — F419 Anxiety disorder, unspecified: Secondary | ICD-10-CM | POA: Diagnosis not present

## 2022-06-09 DIAGNOSIS — R635 Abnormal weight gain: Secondary | ICD-10-CM | POA: Diagnosis not present

## 2022-06-09 DIAGNOSIS — R232 Flushing: Secondary | ICD-10-CM | POA: Diagnosis not present

## 2022-06-09 MED ORDER — ALPRAZOLAM 1 MG PO TABS: 1.0000 mg | ORAL_TABLET | Freq: Four times a day (QID) | ORAL | 0 refills | Status: DC | PRN

## 2022-06-09 NOTE — Telephone Encounter (Signed)
Done

## 2022-06-09 NOTE — Telephone Encounter (Signed)
Last refill- 12/02/21-120 tabs, 5 refills Last OV-10/15/21  No future OV scheduled.

## 2022-06-10 NOTE — Telephone Encounter (Signed)
Pt is asking that MD re-direct her prescription for the ALPRAZolam Duanne Moron) 1 MG tablet  To the  [id number] - [place], [place] - [address] Phone:  [phone]  Fax:  [phone]      Because the [facility] told her that they cannot fill Rx from out of state unless MD has a license to do so.   Pt will be back in [place] on 06/26/2022.  Please advise.

## 2022-06-11 MED ORDER — ALPRAZOLAM 1 MG PO TABS: 1.0000 mg | ORAL_TABLET | Freq: Four times a day (QID) | ORAL | 5 refills | Status: DC | PRN

## 2022-06-11 NOTE — Telephone Encounter (Signed)
Spoke with patient, aware prescription has been sent to [facility].

## 2022-06-11 NOTE — Telephone Encounter (Signed)
Done

## 2022-06-11 NOTE — Addendum Note (Signed)
Addended by: Alysia Penna A on: 06/11/2022 07:44 AM   Modules accepted: Orders

## 2022-06-19 DIAGNOSIS — Z01818 Encounter for other preprocedural examination: Secondary | ICD-10-CM | POA: Diagnosis not present

## 2022-06-19 DIAGNOSIS — I10 Essential (primary) hypertension: Secondary | ICD-10-CM | POA: Diagnosis not present

## 2022-06-19 DIAGNOSIS — E785 Hyperlipidemia, unspecified: Secondary | ICD-10-CM | POA: Diagnosis not present

## 2022-06-19 DIAGNOSIS — Z01810 Encounter for preprocedural cardiovascular examination: Secondary | ICD-10-CM | POA: Diagnosis not present

## 2022-06-19 DIAGNOSIS — N183 Chronic kidney disease, stage 3 unspecified: Secondary | ICD-10-CM | POA: Diagnosis not present

## 2022-06-19 DIAGNOSIS — R232 Flushing: Secondary | ICD-10-CM | POA: Diagnosis not present

## 2022-06-20 DIAGNOSIS — Z0000 Encounter for general adult medical examination without abnormal findings: Secondary | ICD-10-CM | POA: Diagnosis not present

## 2022-06-26 DIAGNOSIS — J3089 Other allergic rhinitis: Secondary | ICD-10-CM | POA: Diagnosis not present

## 2022-06-26 DIAGNOSIS — J3081 Allergic rhinitis due to animal (cat) (dog) hair and dander: Secondary | ICD-10-CM | POA: Diagnosis not present

## 2022-06-26 DIAGNOSIS — J301 Allergic rhinitis due to pollen: Secondary | ICD-10-CM | POA: Diagnosis not present

## 2022-07-06 DIAGNOSIS — J3089 Other allergic rhinitis: Secondary | ICD-10-CM | POA: Diagnosis not present

## 2022-07-06 DIAGNOSIS — J301 Allergic rhinitis due to pollen: Secondary | ICD-10-CM | POA: Diagnosis not present

## 2022-07-08 DIAGNOSIS — J301 Allergic rhinitis due to pollen: Secondary | ICD-10-CM | POA: Diagnosis not present

## 2022-07-08 DIAGNOSIS — Z516 Encounter for desensitization to allergens: Secondary | ICD-10-CM | POA: Diagnosis not present

## 2022-07-08 DIAGNOSIS — J3089 Other allergic rhinitis: Secondary | ICD-10-CM | POA: Diagnosis not present

## 2022-07-09 ENCOUNTER — Other Ambulatory Visit: Payer: Self-pay | Admitting: Family Medicine

## 2022-07-16 DIAGNOSIS — H2513 Age-related nuclear cataract, bilateral: Secondary | ICD-10-CM | POA: Diagnosis not present

## 2022-07-16 DIAGNOSIS — H3551 Vitreoretinal dystrophy: Secondary | ICD-10-CM | POA: Diagnosis not present

## 2022-07-20 DIAGNOSIS — J3081 Allergic rhinitis due to animal (cat) (dog) hair and dander: Secondary | ICD-10-CM | POA: Diagnosis not present

## 2022-07-20 DIAGNOSIS — J301 Allergic rhinitis due to pollen: Secondary | ICD-10-CM | POA: Diagnosis not present

## 2022-07-20 DIAGNOSIS — J3089 Other allergic rhinitis: Secondary | ICD-10-CM | POA: Diagnosis not present

## 2022-08-01 ENCOUNTER — Other Ambulatory Visit: Payer: Self-pay | Admitting: Family Medicine

## 2022-08-03 NOTE — Telephone Encounter (Signed)
Pt LOV was on 10/15/2022 Last refill was done on 11/21/2021 Please advise

## 2022-08-05 ENCOUNTER — Other Ambulatory Visit: Payer: Self-pay | Admitting: Family Medicine

## 2022-08-05 DIAGNOSIS — M5416 Radiculopathy, lumbar region: Secondary | ICD-10-CM

## 2022-08-05 DIAGNOSIS — J3081 Allergic rhinitis due to animal (cat) (dog) hair and dander: Secondary | ICD-10-CM | POA: Diagnosis not present

## 2022-08-05 DIAGNOSIS — J301 Allergic rhinitis due to pollen: Secondary | ICD-10-CM | POA: Diagnosis not present

## 2022-08-05 DIAGNOSIS — J3089 Other allergic rhinitis: Secondary | ICD-10-CM | POA: Diagnosis not present

## 2022-08-31 DIAGNOSIS — J301 Allergic rhinitis due to pollen: Secondary | ICD-10-CM | POA: Diagnosis not present

## 2022-08-31 DIAGNOSIS — J3089 Other allergic rhinitis: Secondary | ICD-10-CM | POA: Diagnosis not present

## 2022-08-31 DIAGNOSIS — J3081 Allergic rhinitis due to animal (cat) (dog) hair and dander: Secondary | ICD-10-CM | POA: Diagnosis not present

## 2022-09-02 DIAGNOSIS — J301 Allergic rhinitis due to pollen: Secondary | ICD-10-CM | POA: Diagnosis not present

## 2022-09-02 DIAGNOSIS — J3081 Allergic rhinitis due to animal (cat) (dog) hair and dander: Secondary | ICD-10-CM | POA: Diagnosis not present

## 2022-09-02 DIAGNOSIS — J3089 Other allergic rhinitis: Secondary | ICD-10-CM | POA: Diagnosis not present

## 2022-09-07 DIAGNOSIS — J301 Allergic rhinitis due to pollen: Secondary | ICD-10-CM | POA: Diagnosis not present

## 2022-09-07 DIAGNOSIS — J3089 Other allergic rhinitis: Secondary | ICD-10-CM | POA: Diagnosis not present

## 2022-09-07 DIAGNOSIS — J3081 Allergic rhinitis due to animal (cat) (dog) hair and dander: Secondary | ICD-10-CM | POA: Diagnosis not present

## 2022-09-09 DIAGNOSIS — J3089 Other allergic rhinitis: Secondary | ICD-10-CM | POA: Diagnosis not present

## 2022-09-09 DIAGNOSIS — J3081 Allergic rhinitis due to animal (cat) (dog) hair and dander: Secondary | ICD-10-CM | POA: Diagnosis not present

## 2022-09-09 DIAGNOSIS — J301 Allergic rhinitis due to pollen: Secondary | ICD-10-CM | POA: Diagnosis not present

## 2022-09-21 DIAGNOSIS — J3089 Other allergic rhinitis: Secondary | ICD-10-CM | POA: Diagnosis not present

## 2022-09-21 DIAGNOSIS — J301 Allergic rhinitis due to pollen: Secondary | ICD-10-CM | POA: Diagnosis not present

## 2022-09-21 DIAGNOSIS — J3081 Allergic rhinitis due to animal (cat) (dog) hair and dander: Secondary | ICD-10-CM | POA: Diagnosis not present

## 2022-09-23 DIAGNOSIS — J3081 Allergic rhinitis due to animal (cat) (dog) hair and dander: Secondary | ICD-10-CM | POA: Diagnosis not present

## 2022-09-23 DIAGNOSIS — J301 Allergic rhinitis due to pollen: Secondary | ICD-10-CM | POA: Diagnosis not present

## 2022-09-23 DIAGNOSIS — J3089 Other allergic rhinitis: Secondary | ICD-10-CM | POA: Diagnosis not present

## 2022-09-29 DIAGNOSIS — M25562 Pain in left knee: Secondary | ICD-10-CM | POA: Diagnosis not present

## 2022-09-29 DIAGNOSIS — S83242A Other tear of medial meniscus, current injury, left knee, initial encounter: Secondary | ICD-10-CM | POA: Diagnosis not present

## 2022-10-16 ENCOUNTER — Other Ambulatory Visit: Payer: Self-pay | Admitting: Family Medicine

## 2022-10-16 DIAGNOSIS — M5416 Radiculopathy, lumbar region: Secondary | ICD-10-CM

## 2022-10-28 DIAGNOSIS — S83242A Other tear of medial meniscus, current injury, left knee, initial encounter: Secondary | ICD-10-CM | POA: Diagnosis not present

## 2022-10-28 DIAGNOSIS — J45909 Unspecified asthma, uncomplicated: Secondary | ICD-10-CM | POA: Diagnosis not present

## 2022-10-28 DIAGNOSIS — Z79899 Other long term (current) drug therapy: Secondary | ICD-10-CM | POA: Diagnosis not present

## 2022-10-28 DIAGNOSIS — N189 Chronic kidney disease, unspecified: Secondary | ICD-10-CM | POA: Diagnosis not present

## 2022-10-28 DIAGNOSIS — I129 Hypertensive chronic kidney disease with stage 1 through stage 4 chronic kidney disease, or unspecified chronic kidney disease: Secondary | ICD-10-CM | POA: Diagnosis not present

## 2022-10-28 DIAGNOSIS — X58XXXA Exposure to other specified factors, initial encounter: Secondary | ICD-10-CM | POA: Diagnosis not present

## 2022-10-28 DIAGNOSIS — M67362 Transient synovitis, left knee: Secondary | ICD-10-CM | POA: Diagnosis not present

## 2022-10-28 DIAGNOSIS — M2342 Loose body in knee, left knee: Secondary | ICD-10-CM | POA: Diagnosis not present

## 2022-10-28 DIAGNOSIS — M659 Synovitis and tenosynovitis, unspecified: Secondary | ICD-10-CM | POA: Diagnosis not present

## 2022-10-28 DIAGNOSIS — S83282A Other tear of lateral meniscus, current injury, left knee, initial encounter: Secondary | ICD-10-CM | POA: Diagnosis not present

## 2022-10-28 DIAGNOSIS — S83207A Unspecified tear of unspecified meniscus, current injury, left knee, initial encounter: Secondary | ICD-10-CM | POA: Diagnosis not present

## 2022-10-28 DIAGNOSIS — S83222A Peripheral tear of medial meniscus, current injury, left knee, initial encounter: Secondary | ICD-10-CM | POA: Diagnosis not present

## 2022-10-28 DIAGNOSIS — M2242 Chondromalacia patellae, left knee: Secondary | ICD-10-CM | POA: Diagnosis not present

## 2022-11-12 DIAGNOSIS — Z4789 Encounter for other orthopedic aftercare: Secondary | ICD-10-CM | POA: Diagnosis not present

## 2022-11-20 DIAGNOSIS — J3089 Other allergic rhinitis: Secondary | ICD-10-CM | POA: Diagnosis not present

## 2022-11-20 DIAGNOSIS — J3081 Allergic rhinitis due to animal (cat) (dog) hair and dander: Secondary | ICD-10-CM | POA: Diagnosis not present

## 2022-11-20 DIAGNOSIS — J301 Allergic rhinitis due to pollen: Secondary | ICD-10-CM | POA: Diagnosis not present

## 2022-11-23 DIAGNOSIS — J3089 Other allergic rhinitis: Secondary | ICD-10-CM | POA: Diagnosis not present

## 2022-11-23 DIAGNOSIS — J301 Allergic rhinitis due to pollen: Secondary | ICD-10-CM | POA: Diagnosis not present

## 2022-11-23 DIAGNOSIS — J3081 Allergic rhinitis due to animal (cat) (dog) hair and dander: Secondary | ICD-10-CM | POA: Diagnosis not present

## 2022-11-24 DIAGNOSIS — Z4789 Encounter for other orthopedic aftercare: Secondary | ICD-10-CM | POA: Diagnosis not present

## 2022-11-24 DIAGNOSIS — M79662 Pain in left lower leg: Secondary | ICD-10-CM | POA: Diagnosis not present

## 2022-11-25 DIAGNOSIS — J3081 Allergic rhinitis due to animal (cat) (dog) hair and dander: Secondary | ICD-10-CM | POA: Diagnosis not present

## 2022-11-25 DIAGNOSIS — J3089 Other allergic rhinitis: Secondary | ICD-10-CM | POA: Diagnosis not present

## 2022-11-25 DIAGNOSIS — J301 Allergic rhinitis due to pollen: Secondary | ICD-10-CM | POA: Diagnosis not present

## 2022-12-02 DIAGNOSIS — J3081 Allergic rhinitis due to animal (cat) (dog) hair and dander: Secondary | ICD-10-CM | POA: Diagnosis not present

## 2022-12-02 DIAGNOSIS — J301 Allergic rhinitis due to pollen: Secondary | ICD-10-CM | POA: Diagnosis not present

## 2022-12-02 DIAGNOSIS — J3089 Other allergic rhinitis: Secondary | ICD-10-CM | POA: Diagnosis not present

## 2022-12-09 DIAGNOSIS — J3081 Allergic rhinitis due to animal (cat) (dog) hair and dander: Secondary | ICD-10-CM | POA: Diagnosis not present

## 2022-12-09 DIAGNOSIS — J301 Allergic rhinitis due to pollen: Secondary | ICD-10-CM | POA: Diagnosis not present

## 2022-12-09 DIAGNOSIS — J3089 Other allergic rhinitis: Secondary | ICD-10-CM | POA: Diagnosis not present

## 2022-12-16 DIAGNOSIS — J3081 Allergic rhinitis due to animal (cat) (dog) hair and dander: Secondary | ICD-10-CM | POA: Diagnosis not present

## 2022-12-16 DIAGNOSIS — J301 Allergic rhinitis due to pollen: Secondary | ICD-10-CM | POA: Diagnosis not present

## 2022-12-16 DIAGNOSIS — J3089 Other allergic rhinitis: Secondary | ICD-10-CM | POA: Diagnosis not present

## 2022-12-21 DIAGNOSIS — J3089 Other allergic rhinitis: Secondary | ICD-10-CM | POA: Diagnosis not present

## 2022-12-21 DIAGNOSIS — J3081 Allergic rhinitis due to animal (cat) (dog) hair and dander: Secondary | ICD-10-CM | POA: Diagnosis not present

## 2022-12-21 DIAGNOSIS — J301 Allergic rhinitis due to pollen: Secondary | ICD-10-CM | POA: Diagnosis not present

## 2022-12-24 ENCOUNTER — Other Ambulatory Visit: Payer: Self-pay | Admitting: Family Medicine

## 2022-12-25 NOTE — Telephone Encounter (Signed)
Patient called to check on progress of this refill request

## 2023-01-07 DIAGNOSIS — Z1239 Encounter for other screening for malignant neoplasm of breast: Secondary | ICD-10-CM | POA: Diagnosis not present

## 2023-01-07 DIAGNOSIS — Z113 Encounter for screening for infections with a predominantly sexual mode of transmission: Secondary | ICD-10-CM | POA: Diagnosis not present

## 2023-01-07 DIAGNOSIS — Z01419 Encounter for gynecological examination (general) (routine) without abnormal findings: Secondary | ICD-10-CM | POA: Diagnosis not present

## 2023-01-07 DIAGNOSIS — Z1211 Encounter for screening for malignant neoplasm of colon: Secondary | ICD-10-CM | POA: Diagnosis not present

## 2023-01-11 DIAGNOSIS — J301 Allergic rhinitis due to pollen: Secondary | ICD-10-CM | POA: Diagnosis not present

## 2023-01-11 DIAGNOSIS — J3081 Allergic rhinitis due to animal (cat) (dog) hair and dander: Secondary | ICD-10-CM | POA: Diagnosis not present

## 2023-01-11 DIAGNOSIS — J3089 Other allergic rhinitis: Secondary | ICD-10-CM | POA: Diagnosis not present

## 2023-02-01 DIAGNOSIS — J3081 Allergic rhinitis due to animal (cat) (dog) hair and dander: Secondary | ICD-10-CM | POA: Diagnosis not present

## 2023-02-01 DIAGNOSIS — J3089 Other allergic rhinitis: Secondary | ICD-10-CM | POA: Diagnosis not present

## 2023-02-01 DIAGNOSIS — R1011 Right upper quadrant pain: Secondary | ICD-10-CM | POA: Diagnosis not present

## 2023-02-01 DIAGNOSIS — J301 Allergic rhinitis due to pollen: Secondary | ICD-10-CM | POA: Diagnosis not present

## 2023-02-01 DIAGNOSIS — K5900 Constipation, unspecified: Secondary | ICD-10-CM | POA: Diagnosis not present

## 2023-02-01 DIAGNOSIS — R140 Abdominal distension (gaseous): Secondary | ICD-10-CM | POA: Diagnosis not present

## 2023-02-01 DIAGNOSIS — Z86010 Personal history of colonic polyps: Secondary | ICD-10-CM | POA: Diagnosis not present

## 2023-02-03 DIAGNOSIS — J3081 Allergic rhinitis due to animal (cat) (dog) hair and dander: Secondary | ICD-10-CM | POA: Diagnosis not present

## 2023-02-03 DIAGNOSIS — J301 Allergic rhinitis due to pollen: Secondary | ICD-10-CM | POA: Diagnosis not present

## 2023-02-03 DIAGNOSIS — J3089 Other allergic rhinitis: Secondary | ICD-10-CM | POA: Diagnosis not present

## 2023-02-05 DIAGNOSIS — R1011 Right upper quadrant pain: Secondary | ICD-10-CM | POA: Diagnosis not present

## 2023-02-05 DIAGNOSIS — K8020 Calculus of gallbladder without cholecystitis without obstruction: Secondary | ICD-10-CM | POA: Diagnosis not present

## 2023-02-06 ENCOUNTER — Other Ambulatory Visit: Payer: Self-pay | Admitting: Family Medicine

## 2023-02-06 DIAGNOSIS — M5416 Radiculopathy, lumbar region: Secondary | ICD-10-CM

## 2023-02-13 DIAGNOSIS — N951 Menopausal and female climacteric states: Secondary | ICD-10-CM | POA: Diagnosis not present

## 2023-02-15 DIAGNOSIS — J301 Allergic rhinitis due to pollen: Secondary | ICD-10-CM | POA: Diagnosis not present

## 2023-02-15 DIAGNOSIS — J3089 Other allergic rhinitis: Secondary | ICD-10-CM | POA: Diagnosis not present

## 2023-02-15 DIAGNOSIS — N951 Menopausal and female climacteric states: Secondary | ICD-10-CM | POA: Diagnosis not present

## 2023-02-15 DIAGNOSIS — J3081 Allergic rhinitis due to animal (cat) (dog) hair and dander: Secondary | ICD-10-CM | POA: Diagnosis not present

## 2023-02-16 DIAGNOSIS — M25562 Pain in left knee: Secondary | ICD-10-CM | POA: Diagnosis not present

## 2023-02-16 DIAGNOSIS — M1712 Unilateral primary osteoarthritis, left knee: Secondary | ICD-10-CM | POA: Diagnosis not present

## 2023-02-16 DIAGNOSIS — Z4789 Encounter for other orthopedic aftercare: Secondary | ICD-10-CM | POA: Diagnosis not present

## 2023-02-24 DIAGNOSIS — R079 Chest pain, unspecified: Secondary | ICD-10-CM | POA: Diagnosis not present

## 2023-02-24 DIAGNOSIS — R519 Headache, unspecified: Secondary | ICD-10-CM | POA: Diagnosis not present

## 2023-02-24 DIAGNOSIS — R55 Syncope and collapse: Secondary | ICD-10-CM | POA: Diagnosis not present

## 2023-02-24 DIAGNOSIS — R0789 Other chest pain: Secondary | ICD-10-CM | POA: Diagnosis not present

## 2023-02-24 DIAGNOSIS — I10 Essential (primary) hypertension: Secondary | ICD-10-CM | POA: Diagnosis not present

## 2023-02-24 DIAGNOSIS — Z79899 Other long term (current) drug therapy: Secondary | ICD-10-CM | POA: Diagnosis not present

## 2023-02-24 DIAGNOSIS — S0990XA Unspecified injury of head, initial encounter: Secondary | ICD-10-CM | POA: Diagnosis not present

## 2023-02-24 DIAGNOSIS — I70212 Atherosclerosis of native arteries of extremities with intermittent claudication, left leg: Secondary | ICD-10-CM | POA: Diagnosis not present

## 2023-03-01 DIAGNOSIS — J3081 Allergic rhinitis due to animal (cat) (dog) hair and dander: Secondary | ICD-10-CM | POA: Diagnosis not present

## 2023-03-01 DIAGNOSIS — J3089 Other allergic rhinitis: Secondary | ICD-10-CM | POA: Diagnosis not present

## 2023-03-01 DIAGNOSIS — J301 Allergic rhinitis due to pollen: Secondary | ICD-10-CM | POA: Diagnosis not present

## 2023-03-08 DIAGNOSIS — J301 Allergic rhinitis due to pollen: Secondary | ICD-10-CM | POA: Diagnosis not present

## 2023-03-08 DIAGNOSIS — J3081 Allergic rhinitis due to animal (cat) (dog) hair and dander: Secondary | ICD-10-CM | POA: Diagnosis not present

## 2023-03-08 DIAGNOSIS — J3089 Other allergic rhinitis: Secondary | ICD-10-CM | POA: Diagnosis not present

## 2023-03-12 DIAGNOSIS — N1831 Chronic kidney disease, stage 3a: Secondary | ICD-10-CM | POA: Diagnosis not present

## 2023-03-12 DIAGNOSIS — K8020 Calculus of gallbladder without cholecystitis without obstruction: Secondary | ICD-10-CM | POA: Diagnosis not present

## 2023-03-12 DIAGNOSIS — I10 Essential (primary) hypertension: Secondary | ICD-10-CM | POA: Diagnosis not present

## 2023-03-12 DIAGNOSIS — R339 Retention of urine, unspecified: Secondary | ICD-10-CM | POA: Diagnosis not present

## 2023-03-27 DIAGNOSIS — R7309 Other abnormal glucose: Secondary | ICD-10-CM | POA: Diagnosis not present

## 2023-03-28 ENCOUNTER — Emergency Department (HOSPITAL_BASED_OUTPATIENT_CLINIC_OR_DEPARTMENT_OTHER)
Admission: EM | Admit: 2023-03-28 | Discharge: 2023-03-28 | Disposition: A | Discharge status: Home / Self Care | Payer: BC Managed Care – PPO | Attending: Emergency Medicine | Admitting: Emergency Medicine

## 2023-03-28 ENCOUNTER — Encounter (HOSPITAL_BASED_OUTPATIENT_CLINIC_OR_DEPARTMENT_OTHER): Payer: Self-pay

## 2023-03-28 ENCOUNTER — Other Ambulatory Visit: Payer: Self-pay

## 2023-03-28 ENCOUNTER — Emergency Department (HOSPITAL_BASED_OUTPATIENT_CLINIC_OR_DEPARTMENT_OTHER): Payer: BC Managed Care – PPO | Admitting: Radiology

## 2023-03-28 DIAGNOSIS — Z79899 Other long term (current) drug therapy: Secondary | ICD-10-CM | POA: Insufficient documentation

## 2023-03-28 DIAGNOSIS — R5383 Other fatigue: Secondary | ICD-10-CM | POA: Diagnosis not present

## 2023-03-28 DIAGNOSIS — R0602 Shortness of breath: Secondary | ICD-10-CM | POA: Diagnosis not present

## 2023-03-28 DIAGNOSIS — I10 Essential (primary) hypertension: Secondary | ICD-10-CM | POA: Insufficient documentation

## 2023-03-28 LAB — CBC
HCT: 35.8 % — ABNORMAL LOW (ref 36.0–46.0)
Hemoglobin: 12.2 g/dL (ref 12.0–15.0)
MCH: 28.7 pg (ref 26.0–34.0)
MCHC: 34.1 g/dL (ref 30.0–36.0)
MCV: 84.2 fL (ref 80.0–100.0)
Platelets: 304 10*3/uL (ref 150–400)
RBC: 4.25 MIL/uL (ref 3.87–5.11)
RDW: 15 % (ref 11.5–15.5)
WBC: 7.6 10*3/uL (ref 4.0–10.5)
nRBC: 0 % (ref 0.0–0.2)

## 2023-03-28 LAB — BASIC METABOLIC PANEL
Anion gap: 9 (ref 5–15)
BUN: 14 mg/dL (ref 6–20)
CO2: 28 mmol/L (ref 22–32)
Calcium: 8.9 mg/dL (ref 8.9–10.3)
Chloride: 101 mmol/L (ref 98–111)
Creatinine, Ser: 0.9 mg/dL (ref 0.44–1.00)
GFR, Estimated: >60 mL/min (ref >60)
Glucose, Bld: 116 mg/dL — ABNORMAL HIGH (ref 70–99)
Potassium: 3.3 mmol/L — ABNORMAL LOW (ref 3.5–5.1)
Sodium: 138 mmol/L (ref 135–145)

## 2023-03-28 LAB — TROPONIN I (HIGH SENSITIVITY): Troponin I (High Sensitivity): 3 ng/L (ref <18)

## 2023-03-28 NOTE — ED Notes (Signed)
Dc instructions reviewed with patient. Patient voiced understanding. Dc with belongings.  °

## 2023-03-28 NOTE — ED Provider Notes (Signed)
EMERGENCY DEPARTMENT AT [facility] Provider Note   CSN: [id number] Arrival date & time: 03/28/23  1653     History  Chief Complaint  Patient presents with   Hypertension    Zoe Swanson is a 47 y.o. female.  47 yo F with a chief complaints of generalized fatigue and edema.  This has been going on for years.  She also tells me that she feels like her heart races off and on.  She feels that it is never normal and is always fast.  She recently had some medication changes done by her doctor.  He had adjusted her hypertension medications and taken her off hydrochlorothiazide and started her on Lasix and had adjusted her losartan   Hypertension       Home Medications Prior to Admission medications   Medication Sig Start Date End Date Taking? Authorizing Provider  albuterol (PROVENTIL) (2.5 MG/3ML) 0.083% nebulizer solution Take 3 mLs (2.5 mg total) by nebulization every 4 (four) hours as needed for wheezing or shortness of breath (asthma). 10/15/21   Nelwyn Salisbury, MD  albuterol (VENTOLIN HFA) 108 (90 Base) MCG/ACT inhaler Inhale 2 puffs into the lungs every 4 (four) hours as needed for wheezing or shortness of breath. 10/15/21   Nelwyn Salisbury, MD  ALPRAZolam Prudy Feeler) 1 MG tablet TAKE 1 TABLET BY MOUTH EVERY 6 HOURS AS NEEDED FOR ANXIETY 12/25/22   Nelwyn Salisbury, MD  augmented betamethasone dipropionate (DIPROLENE-AF) 0.05 % cream APPLY TO AFFECTED AREA(S) DAILY TOPICALLY 06/03/20   Janalyn Harder, MD  cyclobenzaprine (FLEXERIL) 10 MG tablet TAKE 1 TABLET BY MOUTH THREE TIMES A DAY AS NEEDED FOR MUSCLE SPASM 10/16/22   Nelwyn Salisbury, MD  diphenhydrAMINE (BENADRYL) 25 MG tablet Take 25 mg by mouth every 6 (six) hours as needed for allergies (allergic reaction).    [provider]  EPINEPHrine 0.3 mg/0.3 mL IJ SOAJ injection INJECT INTO THE MIDDLE OF THE OUTER THIGH AND HOLD FOR 3 SECONDS AS NEEDED FOR SEVERE ALLERGIC REACTION THEN CALL 911 IF USED. 10/15/21    Nelwyn Salisbury, MD  Ipratropium-Albuterol (COMBIVENT RESPIMAT) 20-100 MCG/ACT AERS respimat INHALE ONE INHALATION BY MOUTH EVERY 6 HOURS AS NEEDED FOR WHEEZING 10/15/21   Nelwyn Salisbury, MD  losartan (COZAAR) 100 MG tablet Take 100 mg by mouth daily. 10/01/21   [provider]  montelukast (SINGULAIR) 10 MG tablet Take 1 tablet (10 mg total) by mouth at bedtime. 10/15/21   Nelwyn Salisbury, MD  nebivolol (BYSTOLIC) 10 MG tablet Take 1 tablet (10 mg total) by mouth daily. 12/10/20   Nelwyn Salisbury, MD  nitroGLYCERIN (NITROSTAT) 0.4 MG SL tablet Place 1 tablet (0.4 mg total) under the tongue every 5 (five) minutes as needed for chest pain. MAX 3 doses 10/15/21 01/13/22  Nelwyn Salisbury, MD  phentermine 30 MG capsule TAKE 1 CAPSULE BY MOUTH EVERY DAY IN THE MORNING 08/03/22   Nelwyn Salisbury, MD  triamcinolone cream (KENALOG) 0.1 % APPLY TO AFFECTED AREA TWICE A DAY 07/10/22   Nelwyn Salisbury, MD      Allergies    Pollen extract, Metoprolol, Mold extract [trichophyton], Oxycodone-acetaminophen, Percocet [oxycodone-acetaminophen], and Lisinopril    Review of Systems   Review of Systems  Physical Exam Updated Vital Signs BP 122/70   Pulse 100   Temp (!) 96.5 F (35.8 C) (Temporal)   Resp 12   Ht 5' 5.5" (1.664 m)   Wt 77.6 kg   LMP  09/05/2019   SpO2 100%   BMI 28.04 kg/m  Physical Exam Vitals and nursing note reviewed.  Constitutional:      General: She is not in acute distress.    Appearance: She is well-developed. She is not diaphoretic.  HENT:     Head: Normocephalic and atraumatic.  Eyes:     Pupils: Pupils are equal, round, and reactive to light.  Cardiovascular:     Rate and Rhythm: Normal rate and regular rhythm.     Heart sounds: No murmur heard.    No friction rub. No gallop.  Pulmonary:     Effort: Pulmonary effort is normal.     Breath sounds: No wheezing or rales.  Abdominal:     General: There is no distension.     Palpations: Abdomen is soft.     Tenderness:  There is no abdominal tenderness.  Musculoskeletal:        General: No tenderness.     Cervical back: Normal range of motion and neck supple.     Right lower leg: No edema.     Left lower leg: No edema.     Comments: No appreciable edema  Skin:    General: Skin is warm and dry.  Neurological:     Mental Status: She is alert and oriented to person, place, and time.  Psychiatric:        Behavior: Behavior normal.     ED Results / Procedures / Treatments   Labs (all labs ordered are listed, but only abnormal results are displayed) Labs Reviewed  BASIC METABOLIC PANEL - Abnormal; Notable for the following components:      Result Value   Potassium 3.3 (*)    Glucose, Bld 116 (*)    All other components within normal limits  CBC - Abnormal; Notable for the following components:   HCT 35.8 (*)    All other components within normal limits  TROPONIN I (HIGH SENSITIVITY)  TROPONIN I (HIGH SENSITIVITY)    EKG EKG Interpretation  Date/Time:  Sunday March 28 2023 17:02:02 EDT Ventricular Rate:  102 PR Interval:  148 QRS Duration: 84 QT Interval:  356 QTC Calculation: 463 R Axis:   61 Text Interpretation: Sinus tachycardia Cannot rule out Inferior infarct , age undetermined Abnormal ECG Pulmonary disease pattern Otherwise no significant change Confirmed by Melene Plan [phone]) on 03/28/2023 6:28:21 PM  Radiology DG Chest 2 View  Result Date: 03/28/2023 CLINICAL DATA:  Shortness of breath EXAM: CHEST - 2 VIEW COMPARISON:  None Available. FINDINGS: The heart size and mediastinal contours are within normal limits. Both lungs are clear. The visualized skeletal structures are unremarkable. IMPRESSION: No active cardiopulmonary disease. Electronically Signed   By: Gerome Sam III M.D.   On: 03/28/2023 17:19    Procedures Procedures    Medications Ordered in ED Medications - No data to display  ED Course/ Medical Decision Making/ A&P                             Medical Decision  Making Amount and/or Complexity of Data Reviewed Labs: ordered. Radiology: ordered.   47 yo F with a cc of fatigue and edema.  Going on for years.  She is back for her daughter's graduation and she was feeling more unwell than normal and came for evaluation.  She had blood work a couple weeks ago at her PCPs office that she shows me on her phone that was  completely normal.  She is scheduled to have what looks like autoimmune studies thyroid studies done in the office when she gets back.  She is mildly tachycardic on arrival but resolved without obvious intervention while in the room.  Her blood work without significant electrolyte abnormality, renal function at baseline no significant anemia.  Troponins negative.  Chest x-ray independently interpreted by me without focal infiltrate or pneumothorax.  Will have the patient follow-up with her PCP in [place].  Encouraged to return for worsening.  6:34 PM:  I have discussed the diagnosis/risks/treatment options with the patient and family.  Evaluation and diagnostic testing in the emergency department does not suggest an emergent condition requiring admission or immediate intervention beyond what has been performed at this time.  They will follow up with PCP. We also discussed returning to the ED immediately if new or worsening sx occur. We discussed the sx which are most concerning (e.g., sudden worsening pain, fever, inability to tolerate by mouth) that necessitate immediate return. Medications administered to the patient during their visit and any new prescriptions provided to the patient are listed below.  Medications given during this visit Medications - No data to display   The patient appears reasonably screen and/or stabilized for discharge and I doubt any other medical condition or other [facility] requiring further screening, evaluation, or treatment in the ED at this time prior to discharge.          Final Clinical Impression(s) / ED  Diagnoses Final diagnoses:  Other fatigue    Rx / DC Orders ED Discharge Orders     None         Melene Plan, DO 03/28/23 1834

## 2023-03-28 NOTE — ED Triage Notes (Signed)
Patient here POV from Home.  BP has been high for Months. States it was has been as high as 158/98. Currently takes Lasix and Losartan. Some lightheadedness today. Some CP and SOB as well.   NAD Noted during Triage. A&Ox4. GCS 15. BIB Wheelchair.

## 2023-03-28 NOTE — Discharge Instructions (Signed)
Your blood work here did not show an obvious cause for your symptoms.  Please follow-up with your family doctor in [place].

## 2023-03-29 ENCOUNTER — Ambulatory Visit: Payer: BC Managed Care – PPO | Admitting: Family Medicine

## 2023-04-07 ENCOUNTER — Ambulatory Visit (INDEPENDENT_AMBULATORY_CARE_PROVIDER_SITE_OTHER): Payer: BC Managed Care – PPO | Admitting: Family Medicine

## 2023-04-07 ENCOUNTER — Encounter: Payer: Self-pay | Admitting: Family Medicine

## 2023-04-07 VITALS — BP 128/86 | HR 94 | Temp 98.3°F | Wt 188.0 lb

## 2023-04-07 DIAGNOSIS — R635 Abnormal weight gain: Secondary | ICD-10-CM

## 2023-04-07 DIAGNOSIS — I10 Essential (primary) hypertension: Secondary | ICD-10-CM | POA: Diagnosis not present

## 2023-04-07 DIAGNOSIS — N1831 Chronic kidney disease, stage 3a: Secondary | ICD-10-CM | POA: Diagnosis not present

## 2023-04-07 MED ORDER — PHENTERMINE HCL 30 MG PO CAPS: ORAL_CAPSULE | ORAL | 1 refills | Status: DC

## 2023-04-07 NOTE — Progress Notes (Signed)
   Subjective:    Patient ID: Zoe Swanson, female    DOB: 1976-01-02, 47 y.o.   MRN: [id number]  HPI Here to follow up on weight management. She is happy with the Phentermine and would like to keep taking it. Her BP has been stable. She spends most of her time in [place], [place], and she sees several specialists there including Cardiology,  Nephrology,  and GI. She feels well in general.    Review of Systems  Constitutional: Negative.   Respiratory: Negative.    Cardiovascular: Negative.        Objective:   Physical Exam Constitutional:      Appearance: Normal appearance.  Cardiovascular:     Rate and Rhythm: Normal rate and regular rhythm.     Pulses: Normal pulses.     Heart sounds: Normal heart sounds.  Pulmonary:     Effort: Pulmonary effort is normal.     Breath sounds: Normal breath sounds.  Musculoskeletal:     Right lower leg: No edema.     Left lower leg: No edema.  Neurological:     Mental Status: She is alert.           Assessment & Plan:  Her HTN is well controlled. It sounds like her renal function is stable. For the weight management, we will refill Phentermine for another 6 months.  Gershon Crane, MD

## 2023-04-13 DIAGNOSIS — N1831 Chronic kidney disease, stage 3a: Secondary | ICD-10-CM | POA: Diagnosis not present

## 2023-04-20 DIAGNOSIS — N182 Chronic kidney disease, stage 2 (mild): Secondary | ICD-10-CM | POA: Diagnosis not present

## 2023-04-20 DIAGNOSIS — I10 Essential (primary) hypertension: Secondary | ICD-10-CM | POA: Diagnosis not present

## 2023-04-20 DIAGNOSIS — I471 Supraventricular tachycardia, unspecified: Secondary | ICD-10-CM | POA: Diagnosis not present

## 2023-04-20 DIAGNOSIS — R339 Retention of urine, unspecified: Secondary | ICD-10-CM | POA: Diagnosis not present

## 2023-04-21 DIAGNOSIS — J301 Allergic rhinitis due to pollen: Secondary | ICD-10-CM | POA: Diagnosis not present

## 2023-04-21 DIAGNOSIS — J3089 Other allergic rhinitis: Secondary | ICD-10-CM | POA: Diagnosis not present

## 2023-04-26 DIAGNOSIS — Z516 Encounter for desensitization to allergens: Secondary | ICD-10-CM | POA: Diagnosis not present

## 2023-04-26 DIAGNOSIS — J301 Allergic rhinitis due to pollen: Secondary | ICD-10-CM | POA: Diagnosis not present

## 2023-04-26 DIAGNOSIS — R7309 Other abnormal glucose: Secondary | ICD-10-CM | POA: Diagnosis not present

## 2023-04-26 DIAGNOSIS — J3089 Other allergic rhinitis: Secondary | ICD-10-CM | POA: Diagnosis not present

## 2023-04-28 DIAGNOSIS — J301 Allergic rhinitis due to pollen: Secondary | ICD-10-CM | POA: Diagnosis not present

## 2023-04-28 DIAGNOSIS — J3089 Other allergic rhinitis: Secondary | ICD-10-CM | POA: Diagnosis not present

## 2023-05-05 DIAGNOSIS — J3089 Other allergic rhinitis: Secondary | ICD-10-CM | POA: Diagnosis not present

## 2023-05-05 DIAGNOSIS — J301 Allergic rhinitis due to pollen: Secondary | ICD-10-CM | POA: Diagnosis not present

## 2023-05-05 DIAGNOSIS — Z516 Encounter for desensitization to allergens: Secondary | ICD-10-CM | POA: Diagnosis not present

## 2023-05-07 DIAGNOSIS — R92333 Mammographic heterogeneous density, bilateral breasts: Secondary | ICD-10-CM | POA: Diagnosis not present

## 2023-05-07 DIAGNOSIS — Z1231 Encounter for screening mammogram for malignant neoplasm of breast: Secondary | ICD-10-CM | POA: Diagnosis not present

## 2023-05-07 DIAGNOSIS — Z9889 Other specified postprocedural states: Secondary | ICD-10-CM | POA: Diagnosis not present

## 2023-05-17 DIAGNOSIS — N182 Chronic kidney disease, stage 2 (mild): Secondary | ICD-10-CM | POA: Diagnosis not present

## 2023-05-19 DIAGNOSIS — J3089 Other allergic rhinitis: Secondary | ICD-10-CM | POA: Diagnosis not present

## 2023-05-19 DIAGNOSIS — J301 Allergic rhinitis due to pollen: Secondary | ICD-10-CM | POA: Diagnosis not present

## 2023-05-27 DIAGNOSIS — R7309 Other abnormal glucose: Secondary | ICD-10-CM | POA: Diagnosis not present

## 2023-06-02 DIAGNOSIS — I471 Supraventricular tachycardia, unspecified: Secondary | ICD-10-CM | POA: Diagnosis not present

## 2023-06-02 DIAGNOSIS — I10 Essential (primary) hypertension: Secondary | ICD-10-CM | POA: Diagnosis not present

## 2023-06-02 DIAGNOSIS — N182 Chronic kidney disease, stage 2 (mild): Secondary | ICD-10-CM | POA: Diagnosis not present

## 2023-06-02 DIAGNOSIS — R339 Retention of urine, unspecified: Secondary | ICD-10-CM | POA: Diagnosis not present

## 2023-06-27 DIAGNOSIS — R7309 Other abnormal glucose: Secondary | ICD-10-CM | POA: Diagnosis not present

## 2023-06-29 DIAGNOSIS — R4182 Altered mental status, unspecified: Secondary | ICD-10-CM | POA: Diagnosis not present

## 2023-06-29 DIAGNOSIS — R5383 Other fatigue: Secondary | ICD-10-CM | POA: Diagnosis not present

## 2023-06-29 DIAGNOSIS — I959 Hypotension, unspecified: Secondary | ICD-10-CM | POA: Diagnosis not present

## 2023-06-29 DIAGNOSIS — R55 Syncope and collapse: Secondary | ICD-10-CM | POA: Diagnosis not present

## 2023-06-30 DIAGNOSIS — R1013 Epigastric pain: Secondary | ICD-10-CM | POA: Diagnosis not present

## 2023-07-01 DIAGNOSIS — E669 Obesity, unspecified: Secondary | ICD-10-CM | POA: Diagnosis not present

## 2023-07-01 DIAGNOSIS — Z6832 Body mass index (BMI) 32.0-32.9, adult: Secondary | ICD-10-CM | POA: Diagnosis not present

## 2023-07-05 DIAGNOSIS — R000 Tachycardia, unspecified: Secondary | ICD-10-CM | POA: Diagnosis not present

## 2023-07-05 DIAGNOSIS — E785 Hyperlipidemia, unspecified: Secondary | ICD-10-CM | POA: Diagnosis not present

## 2023-07-05 DIAGNOSIS — I10 Essential (primary) hypertension: Secondary | ICD-10-CM | POA: Diagnosis not present

## 2023-07-05 DIAGNOSIS — N189 Chronic kidney disease, unspecified: Secondary | ICD-10-CM | POA: Diagnosis not present

## 2023-07-10 ENCOUNTER — Other Ambulatory Visit: Payer: Self-pay | Admitting: Family Medicine

## 2023-07-12 NOTE — Telephone Encounter (Signed)
Pt LOV was on 04/07/23 Last refill was done on 12/25/22 Please advise

## 2023-07-15 DIAGNOSIS — I471 Supraventricular tachycardia, unspecified: Secondary | ICD-10-CM | POA: Diagnosis not present

## 2023-07-15 DIAGNOSIS — N182 Chronic kidney disease, stage 2 (mild): Secondary | ICD-10-CM | POA: Diagnosis not present

## 2023-07-15 DIAGNOSIS — R339 Retention of urine, unspecified: Secondary | ICD-10-CM | POA: Diagnosis not present

## 2023-07-15 DIAGNOSIS — R000 Tachycardia, unspecified: Secondary | ICD-10-CM | POA: Diagnosis not present

## 2023-07-15 DIAGNOSIS — I10 Essential (primary) hypertension: Secondary | ICD-10-CM | POA: Diagnosis not present

## 2023-07-19 DIAGNOSIS — E876 Hypokalemia: Secondary | ICD-10-CM | POA: Diagnosis not present

## 2023-07-19 DIAGNOSIS — R42 Dizziness and giddiness: Secondary | ICD-10-CM | POA: Diagnosis not present

## 2023-07-19 DIAGNOSIS — R55 Syncope and collapse: Secondary | ICD-10-CM | POA: Diagnosis not present

## 2023-07-26 DIAGNOSIS — R000 Tachycardia, unspecified: Secondary | ICD-10-CM | POA: Diagnosis not present

## 2023-07-27 DIAGNOSIS — R7309 Other abnormal glucose: Secondary | ICD-10-CM | POA: Diagnosis not present

## 2023-07-28 DIAGNOSIS — K8020 Calculus of gallbladder without cholecystitis without obstruction: Secondary | ICD-10-CM | POA: Diagnosis not present

## 2023-07-28 DIAGNOSIS — N182 Chronic kidney disease, stage 2 (mild): Secondary | ICD-10-CM | POA: Diagnosis not present

## 2023-07-28 DIAGNOSIS — N390 Urinary tract infection, site not specified: Secondary | ICD-10-CM | POA: Diagnosis not present

## 2023-07-28 DIAGNOSIS — R7303 Prediabetes: Secondary | ICD-10-CM | POA: Diagnosis not present

## 2023-07-28 DIAGNOSIS — R7989 Other specified abnormal findings of blood chemistry: Secondary | ICD-10-CM | POA: Diagnosis not present

## 2023-07-28 DIAGNOSIS — K219 Gastro-esophageal reflux disease without esophagitis: Secondary | ICD-10-CM | POA: Diagnosis not present

## 2023-07-28 DIAGNOSIS — F419 Anxiety disorder, unspecified: Secondary | ICD-10-CM | POA: Diagnosis not present

## 2023-07-28 DIAGNOSIS — I129 Hypertensive chronic kidney disease with stage 1 through stage 4 chronic kidney disease, or unspecified chronic kidney disease: Secondary | ICD-10-CM | POA: Diagnosis not present

## 2023-07-28 DIAGNOSIS — R339 Retention of urine, unspecified: Secondary | ICD-10-CM | POA: Diagnosis not present

## 2023-07-28 DIAGNOSIS — I471 Supraventricular tachycardia, unspecified: Secondary | ICD-10-CM | POA: Diagnosis not present

## 2023-07-28 DIAGNOSIS — Z833 Family history of diabetes mellitus: Secondary | ICD-10-CM | POA: Diagnosis not present

## 2023-07-28 DIAGNOSIS — R600 Localized edema: Secondary | ICD-10-CM | POA: Diagnosis not present

## 2023-07-28 DIAGNOSIS — N289 Disorder of kidney and ureter, unspecified: Secondary | ICD-10-CM | POA: Diagnosis not present

## 2023-07-28 DIAGNOSIS — Z79899 Other long term (current) drug therapy: Secondary | ICD-10-CM | POA: Diagnosis not present

## 2023-07-28 DIAGNOSIS — N189 Chronic kidney disease, unspecified: Secondary | ICD-10-CM | POA: Diagnosis not present

## 2023-07-30 DIAGNOSIS — I361 Nonrheumatic tricuspid (valve) insufficiency: Secondary | ICD-10-CM | POA: Diagnosis not present

## 2023-08-03 DIAGNOSIS — S60221A Contusion of right hand, initial encounter: Secondary | ICD-10-CM | POA: Diagnosis not present

## 2023-08-12 DIAGNOSIS — S638X1A Sprain of other part of right wrist and hand, initial encounter: Secondary | ICD-10-CM | POA: Diagnosis not present

## 2023-08-12 DIAGNOSIS — M79641 Pain in right hand: Secondary | ICD-10-CM | POA: Diagnosis not present

## 2023-08-27 DIAGNOSIS — R7309 Other abnormal glucose: Secondary | ICD-10-CM | POA: Diagnosis not present

## 2023-08-30 DIAGNOSIS — I10 Essential (primary) hypertension: Secondary | ICD-10-CM | POA: Diagnosis not present

## 2023-08-30 DIAGNOSIS — R635 Abnormal weight gain: Secondary | ICD-10-CM | POA: Diagnosis not present

## 2023-08-30 DIAGNOSIS — Z780 Asymptomatic menopausal state: Secondary | ICD-10-CM | POA: Diagnosis not present

## 2023-09-08 DIAGNOSIS — I38 Endocarditis, valve unspecified: Secondary | ICD-10-CM | POA: Diagnosis not present

## 2023-09-08 DIAGNOSIS — I10 Essential (primary) hypertension: Secondary | ICD-10-CM | POA: Diagnosis not present

## 2023-09-08 DIAGNOSIS — E785 Hyperlipidemia, unspecified: Secondary | ICD-10-CM | POA: Diagnosis not present

## 2023-09-08 DIAGNOSIS — R000 Tachycardia, unspecified: Secondary | ICD-10-CM | POA: Diagnosis not present

## 2023-09-16 DIAGNOSIS — N182 Chronic kidney disease, stage 2 (mild): Secondary | ICD-10-CM | POA: Diagnosis not present

## 2023-09-16 DIAGNOSIS — I10 Essential (primary) hypertension: Secondary | ICD-10-CM | POA: Diagnosis not present

## 2023-09-16 DIAGNOSIS — I471 Supraventricular tachycardia, unspecified: Secondary | ICD-10-CM | POA: Diagnosis not present

## 2023-09-16 DIAGNOSIS — R339 Retention of urine, unspecified: Secondary | ICD-10-CM | POA: Diagnosis not present

## 2023-09-22 ENCOUNTER — Ambulatory Visit (INDEPENDENT_AMBULATORY_CARE_PROVIDER_SITE_OTHER): Payer: BC Managed Care – PPO | Admitting: Family Medicine

## 2023-09-22 ENCOUNTER — Encounter: Payer: Self-pay | Admitting: Family Medicine

## 2023-09-22 VITALS — BP 110/78 | HR 99 | Temp 98.3°F | Wt 193.0 lb

## 2023-09-22 DIAGNOSIS — N1831 Chronic kidney disease, stage 3a: Secondary | ICD-10-CM | POA: Diagnosis not present

## 2023-09-22 DIAGNOSIS — I471 Supraventricular tachycardia, unspecified: Secondary | ICD-10-CM

## 2023-09-22 DIAGNOSIS — I10 Essential (primary) hypertension: Secondary | ICD-10-CM

## 2023-09-22 LAB — POCT GLYCOSYLATED HEMOGLOBIN (HGB A1C): Hemoglobin A1C: 6.1 % — AB (ref 4.0–5.6)

## 2023-09-22 NOTE — Progress Notes (Signed)
   Subjective:    Patient ID: Zoe Swanson, female    DOB: 1976-03-10, 47 y.o.   MRN: [id number]  HPI Here for an A1c check and to discuss her visits to Dr. Dwyane Dee, her nephrologist in [place], [place] and to Dr. Pryor Curia Taghipour, her cardiologist there. Over the pasy few weeks she has been worked up for SVT. She wore a monitor and she had an ECHO. She has these records but forgot to bring them today. Apparently her creatinine a week ago was 1.2 and the GFR was 58. She has been taking Metoprolol tartrate BID, and this was recently increased to 100 mg BID. This has kept her heart rates down. She denies any chest pain or SOB, but she feels very fatigued all the time. Her A1c here today is 6.)%.   Review of Systems  Constitutional:  Positive for fatigue.  Respiratory: Negative.    Cardiovascular: Negative.   Gastrointestinal: Negative.   Genitourinary: Negative.        Objective:   Physical Exam Constitutional:      Appearance: Normal appearance.  Cardiovascular:     Rate and Rhythm: Normal rate and regular rhythm.     Pulses: Normal pulses.     Heart sounds: Normal heart sounds.  Pulmonary:     Effort: Pulmonary effort is normal.     Breath sounds: Normal breath sounds.  Musculoskeletal:     Right lower leg: No edema.     Left lower leg: No edema.  Neurological:     Mental Status: She is alert.           Assessment & Plan:  She is being treated for CKD, HTN, and now SVT by her specialists in [place]. She will bring their records by our office next week so I can go over them with her.  Gershon Crane, MD

## 2023-09-26 DIAGNOSIS — R7309 Other abnormal glucose: Secondary | ICD-10-CM | POA: Diagnosis not present

## 2023-09-27 NOTE — Addendum Note (Signed)
Addended by: Carola Rhine on: 09/27/2023 08:38 AM   Modules accepted: Orders

## 2023-10-11 ENCOUNTER — Other Ambulatory Visit: Payer: Self-pay

## 2023-10-11 ENCOUNTER — Telehealth: Payer: Self-pay | Admitting: Family Medicine

## 2023-10-11 MED ORDER — TRAZODONE HCL 50 MG PO TABS: 25.0000 mg | ORAL_TABLET | Freq: Every day | ORAL | 2 refills | Status: DC

## 2023-10-11 NOTE — Telephone Encounter (Signed)
Call in Trazodone 50 mg at bedtime, #30 with 2 rf

## 2023-10-11 NOTE — Telephone Encounter (Signed)
Pt call and stated she want to know can dr.Fry call her something to help her sleep. [facility] [id number] - [place], [place] - [address] Phone: [phone]  Fax: [phone]    Pt want a call back.

## 2023-10-14 NOTE — Telephone Encounter (Signed)
Rx was sent on 10/11/23

## 2023-10-27 DIAGNOSIS — R7309 Other abnormal glucose: Secondary | ICD-10-CM | POA: Diagnosis not present

## 2023-10-28 ENCOUNTER — Telehealth: Payer: Self-pay

## 2023-10-28 NOTE — Telephone Encounter (Signed)
 Copied from CRM [id number]. Topic: Clinical - Medical Advice >> Oct 26, 2023  8:29 AM Alfonso ORN wrote: Reason for CRM: Patient want to know if Dr. Garnette Olmsted can put her back on the medication  to help lose  weight , patient not sure of the name she was on about 2 months ago .   want to lose weight and retaining a lot of fluid . patient will be leaving town on friday and want to know if possible can get the medication before this friday 10/29/23

## 2023-10-29 ENCOUNTER — Other Ambulatory Visit: Payer: Self-pay | Admitting: Family Medicine

## 2023-10-29 NOTE — Telephone Encounter (Signed)
 Copied from CRM [id number]. Topic: Clinical - Medication Refill >> Oct 29, 2023  2:52 PM Pinkey ORN wrote: Most Recent Primary Care Visit:  Provider: JOHNNY SENIOR A  Department: [facility]  Visit Type: OFFICE VISIT  Date: 09/22/2023  Medication: ***  Has the patient contacted their pharmacy?  (Agent: If no, request that the patient contact the pharmacy for the refill. If patient does not wish to contact the pharmacy document the reason why and proceed with request.) (Agent: If yes, when and what did the pharmacy advise?)  Is this the correct pharmacy for this prescription?  If no, delete pharmacy and type the correct one.  This is the patient's preferred pharmacy:  [id number] - [place], [place] - [address] AT [facility] [address] [place] [address] Phone: [phone] Fax: [phone]  [id number] [place], [place] - [address] [place] [address] Phone: [phone] Fax: [phone]  [facility] [phone] - [place], [facility] - [phone] [facility] [address] [place] [address] Phone: [phone] Fax: [phone]   Has the prescription been filled recently?   Is the patient out of the medication?   Has the patient been seen for an appointment in the last year OR does the patient have an upcoming appointment?   Can we respond through MyChart?   Agent: Please be advised that Rx refills may take up to 3 business days. We ask that you follow-up with your pharmacy.

## 2023-11-01 ENCOUNTER — Other Ambulatory Visit (HOSPITAL_COMMUNITY): Payer: Self-pay

## 2023-11-01 MED ORDER — PHENTERMINE HCL 30 MG PO CAPS
30.0000 mg | ORAL_CAPSULE | ORAL | 1 refills | Status: DC
Start: 2023-11-01 — End: 2024-02-17

## 2023-11-01 NOTE — Addendum Note (Signed)
 Addended by: Gershon Crane A on: 11/01/2023 10:03 AM   Modules accepted: Orders

## 2023-11-01 NOTE — Telephone Encounter (Signed)
 Done

## 2023-11-02 NOTE — Telephone Encounter (Signed)
 Patient notified of update  and verbalized understanding.

## 2023-11-16 ENCOUNTER — Telehealth: Payer: Self-pay

## 2023-11-16 ENCOUNTER — Other Ambulatory Visit (HOSPITAL_COMMUNITY): Payer: Self-pay

## 2023-11-16 NOTE — Telephone Encounter (Signed)
*  Primary  Pharmacy Patient Advocate Encounter   Received notification from Fax that prior authorization for Phentermine HCl 30MG  capsules  is required/requested.   Insurance verification completed.   The patient is insured through [facility] .   Per test claim: PA required; PA submitted to above mentioned insurance via CoverMyMeds Key/confirmation #/EOC [id number] Status is pending   Patient has heart conditions and most likely will not be covered-last PA in system from 2020 and patient has seemingly been paying out of pocket for all other fills.

## 2023-11-18 NOTE — Telephone Encounter (Signed)
Pharmacy Patient Advocate Encounter  Received notification from [facility] that Prior Authorization for Phentermine HCl 30MG  capsules  has been DENIED.  No reason given; No denial letter received via Fax or CMM. It has been requested and will be uploaded to the media tab once received.

## 2023-11-19 DIAGNOSIS — R079 Chest pain, unspecified: Secondary | ICD-10-CM | POA: Diagnosis not present

## 2023-11-19 DIAGNOSIS — R000 Tachycardia, unspecified: Secondary | ICD-10-CM | POA: Diagnosis not present

## 2023-11-19 DIAGNOSIS — I38 Endocarditis, valve unspecified: Secondary | ICD-10-CM | POA: Diagnosis not present

## 2023-11-19 DIAGNOSIS — R55 Syncope and collapse: Secondary | ICD-10-CM | POA: Diagnosis not present

## 2023-11-27 DIAGNOSIS — R7309 Other abnormal glucose: Secondary | ICD-10-CM | POA: Diagnosis not present

## 2023-12-14 DIAGNOSIS — Z780 Asymptomatic menopausal state: Secondary | ICD-10-CM | POA: Diagnosis not present

## 2023-12-14 DIAGNOSIS — R41840 Attention and concentration deficit: Secondary | ICD-10-CM | POA: Diagnosis not present

## 2023-12-14 DIAGNOSIS — I10 Essential (primary) hypertension: Secondary | ICD-10-CM | POA: Diagnosis not present

## 2023-12-25 DIAGNOSIS — R7309 Other abnormal glucose: Secondary | ICD-10-CM | POA: Diagnosis not present

## 2023-12-29 DIAGNOSIS — I361 Nonrheumatic tricuspid (valve) insufficiency: Secondary | ICD-10-CM | POA: Diagnosis not present

## 2023-12-29 DIAGNOSIS — R079 Chest pain, unspecified: Secondary | ICD-10-CM | POA: Diagnosis not present

## 2023-12-29 DIAGNOSIS — I2510 Atherosclerotic heart disease of native coronary artery without angina pectoris: Secondary | ICD-10-CM | POA: Diagnosis not present

## 2024-01-11 DIAGNOSIS — Z8679 Personal history of other diseases of the circulatory system: Secondary | ICD-10-CM | POA: Diagnosis not present

## 2024-01-11 DIAGNOSIS — R079 Chest pain, unspecified: Secondary | ICD-10-CM | POA: Diagnosis not present

## 2024-01-11 DIAGNOSIS — R0789 Other chest pain: Secondary | ICD-10-CM | POA: Diagnosis not present

## 2024-01-12 DIAGNOSIS — I517 Cardiomegaly: Secondary | ICD-10-CM | POA: Diagnosis not present

## 2024-01-25 DIAGNOSIS — R7309 Other abnormal glucose: Secondary | ICD-10-CM | POA: Diagnosis not present

## 2024-01-26 DIAGNOSIS — I10 Essential (primary) hypertension: Secondary | ICD-10-CM | POA: Diagnosis not present

## 2024-01-26 DIAGNOSIS — R519 Headache, unspecified: Secondary | ICD-10-CM | POA: Diagnosis not present

## 2024-01-27 DIAGNOSIS — Z131 Encounter for screening for diabetes mellitus: Secondary | ICD-10-CM | POA: Diagnosis not present

## 2024-01-27 DIAGNOSIS — I10 Essential (primary) hypertension: Secondary | ICD-10-CM | POA: Diagnosis not present

## 2024-01-27 DIAGNOSIS — F439 Reaction to severe stress, unspecified: Secondary | ICD-10-CM | POA: Diagnosis not present

## 2024-01-27 DIAGNOSIS — Z029 Encounter for administrative examinations, unspecified: Secondary | ICD-10-CM | POA: Diagnosis not present

## 2024-02-01 DIAGNOSIS — I10 Essential (primary) hypertension: Secondary | ICD-10-CM | POA: Diagnosis not present

## 2024-02-01 DIAGNOSIS — Z6830 Body mass index (BMI) 30.0-30.9, adult: Secondary | ICD-10-CM | POA: Diagnosis not present

## 2024-02-04 ENCOUNTER — Other Ambulatory Visit: Payer: Self-pay | Admitting: Family Medicine

## 2024-02-07 DIAGNOSIS — I10 Essential (primary) hypertension: Secondary | ICD-10-CM | POA: Diagnosis not present

## 2024-02-07 DIAGNOSIS — R030 Elevated blood-pressure reading, without diagnosis of hypertension: Secondary | ICD-10-CM | POA: Diagnosis not present

## 2024-02-11 ENCOUNTER — Emergency Department (HOSPITAL_COMMUNITY)
Admission: EM | Admit: 2024-02-11 | Discharge: 2024-02-12 | Disposition: A | Discharge status: Home / Self Care | Attending: Emergency Medicine | Admitting: Emergency Medicine

## 2024-02-11 ENCOUNTER — Other Ambulatory Visit: Payer: Self-pay

## 2024-02-11 DIAGNOSIS — J45909 Unspecified asthma, uncomplicated: Secondary | ICD-10-CM | POA: Diagnosis not present

## 2024-02-11 DIAGNOSIS — I10 Essential (primary) hypertension: Secondary | ICD-10-CM | POA: Insufficient documentation

## 2024-02-11 DIAGNOSIS — Z79899 Other long term (current) drug therapy: Secondary | ICD-10-CM | POA: Insufficient documentation

## 2024-02-11 DIAGNOSIS — R000 Tachycardia, unspecified: Secondary | ICD-10-CM | POA: Diagnosis not present

## 2024-02-11 DIAGNOSIS — R0602 Shortness of breath: Secondary | ICD-10-CM | POA: Diagnosis not present

## 2024-02-11 DIAGNOSIS — R079 Chest pain, unspecified: Secondary | ICD-10-CM | POA: Insufficient documentation

## 2024-02-11 DIAGNOSIS — R1084 Generalized abdominal pain: Secondary | ICD-10-CM | POA: Diagnosis not present

## 2024-02-11 DIAGNOSIS — R1013 Epigastric pain: Secondary | ICD-10-CM | POA: Diagnosis not present

## 2024-02-11 DIAGNOSIS — R0789 Other chest pain: Secondary | ICD-10-CM | POA: Diagnosis not present

## 2024-02-11 NOTE — ED Triage Notes (Signed)
 BIB EMS for crushing chest pain starting at approximately 2230 tonight, radiating to her back, and Shortness of breath. Pain 10/10 with EMS given 6 mg morphine IV, 0.4 nitroglycerin  tablet SL, 342 Asprin PTA. Hx of HTN, and asthma, takes Metoprolol  100mg  and valsartan 80mg  twice daily. Recently on flight back from [place]  today.

## 2024-02-12 ENCOUNTER — Emergency Department (HOSPITAL_COMMUNITY)

## 2024-02-12 LAB — CBC WITH DIFFERENTIAL/PLATELET
Abs Immature Granulocytes: 0.04 10*3/uL (ref 0.00–0.07)
Basophils Absolute: 0.1 10*3/uL (ref 0.0–0.1)
Basophils Relative: 1 %
Eosinophils Absolute: 0.2 10*3/uL (ref 0.0–0.5)
Eosinophils Relative: 3 %
HCT: 38.3 % (ref 36.0–46.0)
Hemoglobin: 12.6 g/dL (ref 12.0–15.0)
Immature Granulocytes: 1 %
Lymphocytes Relative: 31 %
Lymphs Abs: 2.6 10*3/uL (ref 0.7–4.0)
MCH: 28.2 pg (ref 26.0–34.0)
MCHC: 32.9 g/dL (ref 30.0–36.0)
MCV: 85.7 fL (ref 80.0–100.0)
Monocytes Absolute: 0.5 10*3/uL (ref 0.1–1.0)
Monocytes Relative: 6 %
Neutro Abs: 4.9 10*3/uL (ref 1.7–7.7)
Neutrophils Relative %: 58 %
Platelets: 285 10*3/uL (ref 150–400)
RBC: 4.47 MIL/uL (ref 3.87–5.11)
RDW: 15.1 % (ref 11.5–15.5)
WBC: 8.2 10*3/uL (ref 4.0–10.5)
nRBC: 0 % (ref 0.0–0.2)

## 2024-02-12 LAB — COMPREHENSIVE METABOLIC PANEL WITH GFR
ALT: 61 U/L — ABNORMAL HIGH (ref 0–44)
AST: 112 U/L — ABNORMAL HIGH (ref 15–41)
Albumin: 3.6 g/dL (ref 3.5–5.0)
Alkaline Phosphatase: 96 U/L (ref 38–126)
Anion gap: 11 (ref 5–15)
BUN: 14 mg/dL (ref 6–20)
CO2: 23 mmol/L (ref 22–32)
Calcium: 8.9 mg/dL (ref 8.9–10.3)
Chloride: 104 mmol/L (ref 98–111)
Creatinine, Ser: 0.99 mg/dL (ref 0.44–1.00)
GFR, Estimated: >60 mL/min (ref >60)
Glucose, Bld: 125 mg/dL — ABNORMAL HIGH (ref 70–99)
Potassium: 3.7 mmol/L (ref 3.5–5.1)
Sodium: 138 mmol/L (ref 135–145)
Total Bilirubin: 0.8 mg/dL (ref 0.0–1.2)
Total Protein: 6.4 g/dL — ABNORMAL LOW (ref 6.5–8.1)

## 2024-02-12 LAB — D-DIMER, QUANTITATIVE: D-Dimer, Quant: <0.27 ug{FEU}/mL (ref 0.00–0.50)

## 2024-02-12 LAB — TROPONIN I (HIGH SENSITIVITY)
Troponin I (High Sensitivity): 4 ng/L (ref <18)
Troponin I (High Sensitivity): 5 ng/L (ref <18)

## 2024-02-12 LAB — HCG, QUANTITATIVE, PREGNANCY: hCG, Beta Chain, Quant, S: 1 m[IU]/mL (ref <5)

## 2024-02-12 LAB — LIPASE, BLOOD: Lipase: 39 U/L (ref 11–51)

## 2024-02-12 NOTE — ED Provider Notes (Signed)
 [place] EMERGENCY DEPARTMENT AT [facility] Provider Note   CSN: [id number] Arrival date & time: 02/11/24  2346     History  Chief Complaint  Patient presents with   Chest Pain   Shortness of Breath    MARVELOUS WOOLFORD is a 48 y.o. female.  48 yo F presents to ED with cp/sob that started earlier today after getting off an airplane. No h/o same. No associated diaphoresis, lightheadedness or spread of pain. No leg edema or calf pain. No h/o hormone replacement therapy. H/o HTN. Ems gave nitro and morphine with resolution of symptoms. No recent illnesess, fever or cough.    Chest Pain Associated symptoms: shortness of breath   Shortness of Breath Associated symptoms: chest pain        Home Medications Prior to Admission medications   Medication Sig Start Date End Date Taking? Authorizing Provider  albuterol  (PROVENTIL ) (2.5 MG/3ML) 0.083% nebulizer solution Take 3 mLs (2.5 mg total) by nebulization every 4 (four) hours as needed for wheezing or shortness of breath (asthma). 10/15/21   Donley Furth, MD  albuterol  (VENTOLIN  HFA) 108 [id number] Base) MCG/ACT inhaler Inhale 2 puffs into the lungs every 4 (four) hours as needed for wheezing or shortness of breath. 10/15/21   Donley Furth, MD  ALPRAZolam  (XANAX ) 1 MG tablet TAKE 1 TABLET BY MOUTH EVERY 6 HOURS AS NEEDED FOR ANXIETY 02/04/24   Donley Furth, MD  augmented betamethasone  dipropionate (DIPROLENE -AF) 0.05 % cream APPLY TO AFFECTED AREA(S) DAILY TOPICALLY 06/03/20   Devon Fogo, MD  cyclobenzaprine  (FLEXERIL ) 10 MG tablet TAKE 1 TABLET BY MOUTH THREE TIMES A DAY AS NEEDED FOR MUSCLE SPASM 10/16/22   Donley Furth, MD  diphenhydrAMINE  (BENADRYL ) 25 MG tablet Take 25 mg by mouth every 6 (six) hours as needed for allergies (allergic reaction).    [provider]  EPINEPHrine  0.3 mg/0.3 mL IJ SOAJ injection INJECT INTO THE MIDDLE OF THE OUTER THIGH AND HOLD FOR 3 SECONDS AS NEEDED FOR SEVERE ALLERGIC REACTION  THEN CALL 911 IF USED. 10/15/21   Donley Furth, MD  ezetimibe (ZETIA) 10 MG tablet Take 10 mg by mouth daily. 10/21/22   [provider]  FARXIGA 10 MG TABS tablet 10 mg daily. 06/02/23   [provider]  furosemide  (LASIX ) 20 MG tablet Take 20 mg by mouth daily.    [provider]  Ipratropium-Albuterol  (COMBIVENT  RESPIMAT) 20-100 MCG/ACT AERS respimat INHALE ONE INHALATION BY MOUTH EVERY 6 HOURS AS NEEDED FOR WHEEZING 10/15/21   Donley Furth, MD  losartan (COZAAR) 100 MG tablet Take 100 mg by mouth daily. 10/01/21   [provider]  metoprolol  tartrate (LOPRESSOR ) 100 MG tablet Take 100 mg by mouth 2 (two) times daily. 09/08/23   [provider]  montelukast  (SINGULAIR ) 10 MG tablet Take 1 tablet (10 mg total) by mouth at bedtime. 10/15/21   Donley Furth, MD  nitroGLYCERIN  (NITROSTAT ) 0.4 MG SL tablet Place 1 tablet (0.4 mg total) under the tongue every 5 (five) minutes as needed for chest pain. MAX 3 doses 10/15/21 01/13/22  Donley Furth, MD  phentermine  30 MG capsule Take 1 capsule (30 mg total) by mouth every morning. 11/01/23   Donley Furth, MD  POTASSIUM CHLORIDE ER PO Take 10 mg by mouth daily at 12 noon. Take 2 tablets daily    [provider]  traZODone  (DESYREL ) 50 MG tablet Take 0.5-1 tablets (25-50 mg total) by mouth at bedtime.  10/11/23   Donley Furth, MD  triamcinolone  cream (KENALOG ) 0.1 % APPLY TO AFFECTED AREA TWICE A DAY 07/10/22   Donley Furth, MD      Allergies    Pollen extract, Metoprolol , Mold extract [trichophyton], Oxycodone -acetaminophen , Percocet [oxycodone -acetaminophen ], and Lisinopril     Review of Systems   Review of Systems  Respiratory:  Positive for shortness of breath.   Cardiovascular:  Positive for chest pain.    Physical Exam Updated Vital Signs BP 121/77 (BP Location: Left Arm)   Pulse 73   Temp 97.7 F (36.5 C) (Oral)   Resp 18   LMP 09/05/2019   SpO2 100%  Physical Exam Vitals and  nursing note reviewed.  Constitutional:      Appearance: She is well-developed.  HENT:     Head: Normocephalic and atraumatic.  Cardiovascular:     Rate and Rhythm: Normal rate and regular rhythm.  Pulmonary:     Effort: No respiratory distress.     Breath sounds: No stridor.  Abdominal:     General: There is no distension.  Musculoskeletal:     Cervical back: Normal range of motion.  Neurological:     Mental Status: She is alert.     ED Results / Procedures / Treatments   Labs (all labs ordered are listed, but only abnormal results are displayed) Labs Reviewed  COMPREHENSIVE METABOLIC PANEL WITH GFR - Abnormal; Notable for the following components:      Result Value   Glucose, Bld 125 (*)    Total Protein 6.4 (*)    AST 112 (*)    ALT 61 (*)    All other components within normal limits  CBC WITH DIFFERENTIAL/PLATELET  LIPASE, BLOOD  D-DIMER, QUANTITATIVE  HCG, QUANTITATIVE, PREGNANCY  TROPONIN I (HIGH SENSITIVITY)  TROPONIN I (HIGH SENSITIVITY)    EKG EKG Interpretation Date/Time:  Friday February 11 2024 23:57:19 EDT Ventricular Rate:  79 PR Interval:  155 QRS Duration:  95 QT Interval:  397 QTC Calculation: 456 R Axis:   62  Text Interpretation: Sinus rhythm Probable left ventricular hypertrophy Baseline wander in lead(s) I II aVR No significant change since last tracing Confirmed by Florette Hurry [phone]) on 02/12/2024 3:34:21 PM  Radiology DG Chest 2 View Result Date: 02/12/2024 CLINICAL DATA:  Chest pain. EXAM: CHEST - 2 VIEW COMPARISON:  March 28, 2023 FINDINGS: The heart size and mediastinal contours are within normal limits. Both lungs are clear. The visualized skeletal structures are unremarkable. IMPRESSION: No active cardiopulmonary disease. Electronically Signed   By: Virgle Grime M.D.   On: 02/12/2024 01:02    Procedures Procedures    Medications Ordered in ED Medications - No data to display  ED Course/ Medical Decision Making/ A&P                                  Medical Decision Making Amount and/or Complexity of Data Reviewed Labs: ordered. Radiology: ordered.   Symptoms resolved during stay here.   Delta troponins normal and ecg reassuring - low suspicion for ACS at this time.  D dimer undetectable and no other s/o or risk for PE/dissection - unlikely.  No e/o pneumonia, PTX or other acute causes for symptoms.  Feeling well, VSS and WNL at time of discharge. Will fu w/ pcp for further workup/management.   Final Clinical Impression(s) / ED Diagnoses Final diagnoses:  Hypertension, unspecified type  Nonspecific chest pain  Rx / DC Orders ED Discharge Orders     None         Juliett Eastburn, Reymundo Caulk, MD 02/12/24 2302

## 2024-02-15 ENCOUNTER — Ambulatory Visit: Admitting: Family Medicine

## 2024-02-17 ENCOUNTER — Telehealth: Payer: Self-pay | Admitting: *Deleted

## 2024-02-17 MED ORDER — PHENTERMINE HCL 30 MG PO CAPS: 30.0000 mg | ORAL_CAPSULE | ORAL | 1 refills | Status: DC

## 2024-02-17 NOTE — Telephone Encounter (Signed)
 Copied from CRM [id number]. Topic: Clinical - Medication Question >> Feb 17, 2024  8:32 AM Bambi Bonine D wrote: Reason for CRM: Patient Patient stated that a year ago Dr.Fry prescribe her some weight loss medication and sent it to the pharmacy in [place] . Patient stated that she never received the medication because the pharmacy stated that Dr.Fry wasn't subscribed to that location. Patient is now needing the medication for weight loss again and would like Dr.Fry to prescribe a new medication request and send it to the [facility] before she leaves on Monday.

## 2024-02-17 NOTE — Telephone Encounter (Signed)
 Done

## 2024-02-23 DIAGNOSIS — R55 Syncope and collapse: Secondary | ICD-10-CM | POA: Diagnosis not present

## 2024-02-23 DIAGNOSIS — I2510 Atherosclerotic heart disease of native coronary artery without angina pectoris: Secondary | ICD-10-CM | POA: Diagnosis not present

## 2024-02-23 DIAGNOSIS — R000 Tachycardia, unspecified: Secondary | ICD-10-CM | POA: Diagnosis not present

## 2024-02-23 DIAGNOSIS — I38 Endocarditis, valve unspecified: Secondary | ICD-10-CM | POA: Diagnosis not present

## 2024-02-24 DIAGNOSIS — R7309 Other abnormal glucose: Secondary | ICD-10-CM | POA: Diagnosis not present

## 2024-02-29 DIAGNOSIS — R55 Syncope and collapse: Secondary | ICD-10-CM | POA: Diagnosis not present

## 2024-03-02 DIAGNOSIS — F411 Generalized anxiety disorder: Secondary | ICD-10-CM | POA: Diagnosis not present

## 2024-03-02 DIAGNOSIS — R55 Syncope and collapse: Secondary | ICD-10-CM | POA: Diagnosis not present

## 2024-03-02 DIAGNOSIS — I10 Essential (primary) hypertension: Secondary | ICD-10-CM | POA: Diagnosis not present

## 2024-03-02 DIAGNOSIS — H5702 Anisocoria: Secondary | ICD-10-CM | POA: Diagnosis not present

## 2024-03-07 DIAGNOSIS — R55 Syncope and collapse: Secondary | ICD-10-CM | POA: Diagnosis not present

## 2024-03-10 DIAGNOSIS — F4310 Post-traumatic stress disorder, unspecified: Secondary | ICD-10-CM | POA: Diagnosis not present

## 2024-03-13 DIAGNOSIS — Z4509 Encounter for adjustment and management of other cardiac device: Secondary | ICD-10-CM | POA: Diagnosis not present

## 2024-03-15 DIAGNOSIS — F4310 Post-traumatic stress disorder, unspecified: Secondary | ICD-10-CM | POA: Diagnosis not present

## 2024-03-16 DIAGNOSIS — F4310 Post-traumatic stress disorder, unspecified: Secondary | ICD-10-CM | POA: Diagnosis not present

## 2024-03-21 DIAGNOSIS — F321 Major depressive disorder, single episode, moderate: Secondary | ICD-10-CM | POA: Diagnosis not present

## 2024-03-21 DIAGNOSIS — F4310 Post-traumatic stress disorder, unspecified: Secondary | ICD-10-CM | POA: Diagnosis not present

## 2024-03-26 DIAGNOSIS — R7309 Other abnormal glucose: Secondary | ICD-10-CM | POA: Diagnosis not present

## 2024-03-27 DIAGNOSIS — N182 Chronic kidney disease, stage 2 (mild): Secondary | ICD-10-CM | POA: Diagnosis not present

## 2024-03-27 DIAGNOSIS — F4310 Post-traumatic stress disorder, unspecified: Secondary | ICD-10-CM | POA: Diagnosis not present

## 2024-03-27 DIAGNOSIS — F321 Major depressive disorder, single episode, moderate: Secondary | ICD-10-CM | POA: Diagnosis not present

## 2024-03-29 DIAGNOSIS — F321 Major depressive disorder, single episode, moderate: Secondary | ICD-10-CM | POA: Diagnosis not present

## 2024-03-29 DIAGNOSIS — F4310 Post-traumatic stress disorder, unspecified: Secondary | ICD-10-CM | POA: Diagnosis not present

## 2024-03-29 DIAGNOSIS — N951 Menopausal and female climacteric states: Secondary | ICD-10-CM | POA: Diagnosis not present

## 2024-03-29 DIAGNOSIS — N898 Other specified noninflammatory disorders of vagina: Secondary | ICD-10-CM | POA: Diagnosis not present

## 2024-04-07 DIAGNOSIS — I10 Essential (primary) hypertension: Secondary | ICD-10-CM | POA: Diagnosis not present

## 2024-04-13 DIAGNOSIS — R42 Dizziness and giddiness: Secondary | ICD-10-CM | POA: Diagnosis not present

## 2024-04-13 DIAGNOSIS — Z1330 Encounter for screening examination for mental health and behavioral disorders, unspecified: Secondary | ICD-10-CM | POA: Diagnosis not present

## 2024-04-19 DIAGNOSIS — F4310 Post-traumatic stress disorder, unspecified: Secondary | ICD-10-CM | POA: Diagnosis not present

## 2024-04-19 DIAGNOSIS — F321 Major depressive disorder, single episode, moderate: Secondary | ICD-10-CM | POA: Diagnosis not present

## 2024-04-21 DIAGNOSIS — R42 Dizziness and giddiness: Secondary | ICD-10-CM | POA: Diagnosis not present

## 2024-04-24 DIAGNOSIS — F4310 Post-traumatic stress disorder, unspecified: Secondary | ICD-10-CM | POA: Diagnosis not present

## 2024-04-24 DIAGNOSIS — F321 Major depressive disorder, single episode, moderate: Secondary | ICD-10-CM | POA: Diagnosis not present

## 2024-04-25 DIAGNOSIS — R7309 Other abnormal glucose: Secondary | ICD-10-CM | POA: Diagnosis not present

## 2024-04-26 DIAGNOSIS — G90522 Complex regional pain syndrome I of left lower limb: Secondary | ICD-10-CM | POA: Diagnosis not present

## 2024-04-26 DIAGNOSIS — G894 Chronic pain syndrome: Secondary | ICD-10-CM | POA: Diagnosis not present

## 2024-04-26 DIAGNOSIS — M5416 Radiculopathy, lumbar region: Secondary | ICD-10-CM | POA: Diagnosis not present

## 2024-04-26 DIAGNOSIS — Z9689 Presence of other specified functional implants: Secondary | ICD-10-CM | POA: Diagnosis not present

## 2024-05-05 DIAGNOSIS — I7090 Unspecified atherosclerosis: Secondary | ICD-10-CM | POA: Diagnosis not present

## 2024-05-05 DIAGNOSIS — R42 Dizziness and giddiness: Secondary | ICD-10-CM | POA: Diagnosis not present

## 2024-05-05 DIAGNOSIS — Q278 Other specified congenital malformations of peripheral vascular system: Secondary | ICD-10-CM | POA: Diagnosis not present

## 2024-05-08 DIAGNOSIS — I10 Essential (primary) hypertension: Secondary | ICD-10-CM | POA: Diagnosis not present

## 2024-05-17 ENCOUNTER — Encounter: Payer: Self-pay | Admitting: Family Medicine

## 2024-05-17 ENCOUNTER — Other Ambulatory Visit (HOSPITAL_COMMUNITY): Payer: Self-pay

## 2024-05-17 ENCOUNTER — Ambulatory Visit (INDEPENDENT_AMBULATORY_CARE_PROVIDER_SITE_OTHER): Admitting: Family Medicine

## 2024-05-17 VITALS — BP 118/72 | HR 79 | Temp 98.5°F | Wt 200.0 lb

## 2024-05-17 DIAGNOSIS — N1831 Chronic kidney disease, stage 3a: Secondary | ICD-10-CM | POA: Diagnosis not present

## 2024-05-17 DIAGNOSIS — F411 Generalized anxiety disorder: Secondary | ICD-10-CM

## 2024-05-17 DIAGNOSIS — J4540 Moderate persistent asthma, uncomplicated: Secondary | ICD-10-CM

## 2024-05-17 DIAGNOSIS — I10 Essential (primary) hypertension: Secondary | ICD-10-CM

## 2024-05-17 DIAGNOSIS — I471 Supraventricular tachycardia, unspecified: Secondary | ICD-10-CM

## 2024-05-17 DIAGNOSIS — M5416 Radiculopathy, lumbar region: Secondary | ICD-10-CM | POA: Diagnosis not present

## 2024-05-17 DIAGNOSIS — R002 Palpitations: Secondary | ICD-10-CM | POA: Diagnosis not present

## 2024-05-17 DIAGNOSIS — Z860100 Personal history of colon polyps, unspecified: Secondary | ICD-10-CM

## 2024-05-17 MED ORDER — EPINEPHRINE 0.3 MG/0.3ML IJ SOAJ: INTRAMUSCULAR | 5 refills | Status: DC

## 2024-05-17 MED ORDER — CYCLOBENZAPRINE HCL 10 MG PO TABS: 10.0000 mg | ORAL_TABLET | Freq: Three times a day (TID) | ORAL | 3 refills | Status: AC | PRN

## 2024-05-17 NOTE — Progress Notes (Signed)
   Subjective:    Patient ID: Zoe Swanson, female    DOB: 01/19/1976, 48 y.o.   MRN: [id number]  HPI Here to catch up after she recently moved back to [facility] from [place] . She has CKD, and she plans to see Dr. Karna Schmidt of [facility]. She needs a referral for this. She also needs a referral to a GI provider to follow up on her hx of colon polyps. She also needs a referral to Cardiology for her hx of SVT. She had a loop recorder implanted while in [place] so this needs to be followed up on. She has already seen Dr. Garnette Pique at [facility] to work up a syncopal spell she had a few weeks ago. No etiology has been found apparently. She had a brain MRI and a CT angiogram of the head, and these were normal. Her anxiety and HTN and asthma have been stable.    Review of Systems  Constitutional: Negative.   Respiratory: Negative.    Cardiovascular:  Positive for palpitations.  Gastrointestinal: Negative.   Genitourinary: Negative.   Neurological:  Positive for syncope. Negative for dizziness, tremors, seizures, facial asymmetry, speech difficulty, weakness and numbness.       Objective:   Physical Exam Constitutional:      Appearance: Normal appearance.  Cardiovascular:     Rate and Rhythm: Normal rate and regular rhythm.     Pulses: Normal pulses.     Heart sounds: Normal heart sounds.  Pulmonary:     Effort: Pulmonary effort is normal.     Breath sounds: Normal breath sounds.  Neurological:     General: No focal deficit present.     Mental Status: She is alert and oriented to person, place, and time.  Psychiatric:        Mood and Affect: Mood normal.        Behavior: Behavior normal.        Thought Content: Thought content normal.           Assessment & Plan:  She has moved back to [place], and we will do refills to GI, Cardiology, and Nephrology as indicated above. She will follow up with Neurology for the syncopal spell.  Her anxiety and asthma are stable. We spent a total of ( 35  ) minutes reviewing records and discussing these issues.  Garnette Olmsted, MD

## 2024-05-18 ENCOUNTER — Telehealth: Payer: Self-pay | Admitting: Family Medicine

## 2024-05-18 ENCOUNTER — Telehealth: Payer: Self-pay | Admitting: *Deleted

## 2024-05-18 NOTE — Telephone Encounter (Signed)
 Copied from CRM [id number]. Topic: Clinical - Medication Question >> May 17, 2024  3:43 PM Zoe Swanson wrote: Reason for CRM: Patient is calling in because due to a recent visit with Dr Johnny he mentioned multiple options on weight loss medications and advised her to contact him once she spoke with her insurance about what will be covered. Insurance stated they will pay for half and she can pay for remaining so she wants Dr Johnny to go ahead if possible and prescribe her with the medication.

## 2024-05-18 NOTE — Telephone Encounter (Unsigned)
 Copied from CRM [id number]. Topic: General - Other >> May 18, 2024  9:48 AM Gennette ORN wrote: Reason for CRM: Patient is calling in due to her insurance [employer] stated they will accept the medication costs for weight loss.  Insurance stated they will pay for half and she can pay for remaining so she wants Dr Johnny to go ahead if possible and prescribe her with the medication.

## 2024-05-19 ENCOUNTER — Telehealth: Payer: Self-pay | Admitting: Family Medicine

## 2024-05-19 NOTE — Telephone Encounter (Signed)
 Copied from CRM [id number]. Topic: Clinical - Medication Question >> May 19, 2024  2:13 PM Gennette ORN wrote: Reason for CRM: Patient is calling to get an update on the shot medication that Dr. Johnny referred her to get. She understands she has to pay a portion. She wants to get this progress going to start the shots. Please call patient back with update.

## 2024-05-19 NOTE — Telephone Encounter (Signed)
 Spoke with pt states that she spoke with her insurance and they advised to have Dr Johnny prescribe the GLP1 that he feels is the best for pt, pt stated that her insurance explained that she will pay a portion of the cost out of pocket. Please advise

## 2024-05-22 ENCOUNTER — Telehealth: Payer: Self-pay

## 2024-05-22 ENCOUNTER — Other Ambulatory Visit (HOSPITAL_COMMUNITY): Payer: Self-pay

## 2024-05-22 MED ORDER — TIRZEPATIDE-WEIGHT MANAGEMENT 2.5 MG/0.5ML ~~LOC~~ SOLN
2.5000 mg | SUBCUTANEOUS | 5 refills | Status: DC
Start: 2024-05-22 — End: 2024-07-13

## 2024-05-22 NOTE — Telephone Encounter (Signed)
 Patient is aware

## 2024-05-22 NOTE — Telephone Encounter (Signed)
 Pt.notified

## 2024-05-22 NOTE — Telephone Encounter (Signed)
 Pharmacy Patient Advocate Encounter   Received notification from CoverMyMeds that prior authorization for Zepbound  2.5 is required/requested.   Insurance verification completed.   The patient is insured through [facility] .   Per test claim: patient has plan benefit exclusion. Anti-obesity medications are not covered.

## 2024-05-22 NOTE — Telephone Encounter (Signed)
 I sent in a RX for Zepbound

## 2024-05-22 NOTE — Addendum Note (Signed)
 Addended by: JOHNNY SENIOR A on: 05/22/2024 12:40 PM   Modules accepted: Orders

## 2024-05-24 ENCOUNTER — Telehealth: Payer: Self-pay

## 2024-05-24 DIAGNOSIS — N1831 Chronic kidney disease, stage 3a: Secondary | ICD-10-CM

## 2024-05-24 NOTE — Telephone Encounter (Signed)
 Patient informed of the message below.  Patient wanted to know if an oral medication could be given instead of Phentermine  she used previously?  She also asked if a cheaper injectable could be given as Zepbound  costs $1100 a month?  Message sent to PCP.

## 2024-05-24 NOTE — Telephone Encounter (Signed)
 Agent will call pt back to get more info for patient . Pt said she found coupon on [employer]illy website and she can get med for around 400.00. The agent will send CRM

## 2024-05-24 NOTE — Telephone Encounter (Signed)
 The only injection she takes is Zepbound , and this does not come in a pill form

## 2024-05-24 NOTE — Telephone Encounter (Signed)
 Have her meet with Jon our pharmacist to assist her in getting this medication

## 2024-05-24 NOTE — Telephone Encounter (Signed)
 Copied from CRM [id number]. Topic: Clinical - Prescription Issue >> May 23, 2024 12:10 PM Jasmin G wrote: Reason for CRM: Pt was told by her insurance that multiple injections that she gets refilled won't be covered by her insurance anymore, so she wanted to see if she could get prescribed the pill form, please call pt back ASAP to establish best course of action

## 2024-05-24 NOTE — Telephone Encounter (Signed)
 Copied from CRM [id number]. Topic: Clinical - Medical Advice >> May 24, 2024  2:47 PM Frederich PARAS wrote: Reason for CRM: pt calling back to get message back to joanne in the office,pt says she was looking on the internet,and saw the medical discount card, called [facility] , pay pharmacy and she called them and she says that  the price will be around 300$ . Pt disconnected the line before I could get any fax #. Left vm for pt to call back with fax info for [employer]

## 2024-05-25 ENCOUNTER — Telehealth: Payer: Self-pay

## 2024-05-25 ENCOUNTER — Ambulatory Visit: Payer: Self-pay

## 2024-05-25 NOTE — Telephone Encounter (Signed)
 FYI Only or Action Required?: FYI only for provider.  Patient was last seen in primary care on 05/17/2024 by [name] LABOR, MD.  Called Nurse Triage reporting Urine Output.  Symptoms began several weeks ago.  Interventions attempted: Rest, hydration, or home remedies.  Symptoms are: gradually worsening.  Triage Disposition: Go to ED Now (or PCP Triage)  Patient/caregiver understands and will follow disposition?: Yes, will follow disposition  Copied from CRM [id number]. Topic: Clinical - Red Word Triage >> May 25, 2024  4:29 PM Burnard DEL wrote: Red Word that prompted transfer to Nurse Triage: retaining fluid,not able to urinate much,back pain,no appetite,fatigue Reason for Disposition  Patient sounds very sick or weak to the triager  Answer Assessment - Initial Assessment Questions 1. SYMPTOM: What's the main symptom you're concerned about? (e.g., frequency, incontinence)     Less urine output 2. ONSET: When did the  less urine output  start?     A few days 3. PAIN: Is there any pain? If Yes, ask: How bad is it? (Scale: 1-10; mild, moderate, severe)     Back pain,   4. CAUSE: What do you think is causing the symptoms?     I don't know 5. OTHER SYMPTOMS: Do you have any other symptoms? (e.g., blood in urine, fever, flank pain, pain with urination)     Swelling throughout entire body, SOB when supine, dark urine, Pt states that she normally voids a lot throughout the day both amount and occurences. Pt states that she has seen a decrease in amount and how often she needs to go to the ED.  Protocols used: Urinary Symptoms-A-AH

## 2024-05-25 NOTE — Telephone Encounter (Signed)
 Attempted to contact patient to discuss. Had to leave a voicemail.

## 2024-05-25 NOTE — Progress Notes (Addendum)
   05/25/2024  Patient ID: Zoe Swanson, female   DOB: [birth year], 48 y.o.   MRN: [id number]  Received request from PCP to reach out to patient to discuss self-pay access to GLP-1. Attempted to contact patient. Left HIPAA compliant message for patient to return my call at their convenience.   Patient returned my call. Reports that she is hardly urinating and that she feels she is holding on to fluid worse than when she was seen by PCP last week.   Recommended patient call the office directly for further triage to assess if she needs to be seen by PCP or go to ER. Patient reports she will do so immediately.  Will reach back out after that follow up to discuss zepbound  as indicated.   Jon VEAR Lindau, PharmD Clinical Pharmacist [phone]

## 2024-05-26 DIAGNOSIS — R7309 Other abnormal glucose: Secondary | ICD-10-CM | POA: Diagnosis not present

## 2024-05-26 NOTE — Telephone Encounter (Signed)
 Called to f/u with Triage note, pt stated that she feels better and that the swelling has gone down, pt states that she hydrated with Electrolytes and took her lasix  dose which helped her void, states that she voided frequent and that brought her swelling down, pt was provided with the nephology phone number, advised to call them for appointment

## 2024-05-30 NOTE — Addendum Note (Signed)
 Addended by: JOHNNY SENIOR A on: 05/30/2024 10:36 AM   Modules accepted: Orders

## 2024-05-30 NOTE — Telephone Encounter (Signed)
 Lab appt scheduled.

## 2024-05-30 NOTE — Telephone Encounter (Signed)
 Spoke with patient regarding zepbound  self pay program. Still not affordable for patient as she recently lost her job. She is interested in either going back on phentermine  or starting Contrave.   Would recommend obtaining an updated BMP before prescribing as both drugs have renal dosing requirements. She is still waiting to get an appt with nephrology for follow up on her kidney disease.   Dr. Johnny, do you typically prescribe for either phentermine  or Contrave, or do you typically refer patients out to the [facility]?  If you do prescribe, can we get an order for updated BMP for patient to assess proper dosing before prescribing?  Thank you! Jon VEAR Lindau, PharmD Clinical Pharmacist [phone]

## 2024-05-30 NOTE — Telephone Encounter (Signed)
 I understand. I prescribe Phentermine  quite frequently, and I would be happy to let her try some. We can discuss this after her labs are back. I ordered a BMET to get started. Thanks!

## 2024-05-30 NOTE — Progress Notes (Signed)
   05/30/2024  Patient ID: Zoe Swanson, female   DOB: Mar 10, 1976, 48 y.o.   MRN: [id number]  Spoke with patient about necessary labs. Scheduled appt for 06/01/24 with lab.  Jon VEAR Lindau, PharmD Clinical Pharmacist [phone]

## 2024-05-31 NOTE — Telephone Encounter (Signed)
 Noted

## 2024-06-01 ENCOUNTER — Other Ambulatory Visit

## 2024-06-08 DIAGNOSIS — I10 Essential (primary) hypertension: Secondary | ICD-10-CM | POA: Diagnosis not present

## 2024-06-09 ENCOUNTER — Other Ambulatory Visit (INDEPENDENT_AMBULATORY_CARE_PROVIDER_SITE_OTHER)

## 2024-06-09 DIAGNOSIS — N1831 Chronic kidney disease, stage 3a: Secondary | ICD-10-CM

## 2024-06-09 LAB — BASIC METABOLIC PANEL WITH GFR
BUN: 15 mg/dL (ref 6–23)
CO2: 25 meq/L (ref 19–32)
Calcium: 8.9 mg/dL (ref 8.4–10.5)
Chloride: 103 meq/L (ref 96–112)
Creatinine, Ser: 0.86 mg/dL (ref 0.40–1.20)
GFR: 79.17 mL/min (ref >60.00)
Glucose, Bld: 96 mg/dL (ref 70–99)
Potassium: 4.1 meq/L (ref 3.5–5.1)
Sodium: 137 meq/L (ref 135–145)

## 2024-06-12 ENCOUNTER — Ambulatory Visit: Payer: Self-pay | Admitting: Family Medicine

## 2024-06-12 ENCOUNTER — Other Ambulatory Visit (INDEPENDENT_AMBULATORY_CARE_PROVIDER_SITE_OTHER)

## 2024-06-12 DIAGNOSIS — N1831 Chronic kidney disease, stage 3a: Secondary | ICD-10-CM

## 2024-06-12 NOTE — Progress Notes (Signed)
   06/12/2024  Patient ID: Zoe Swanson, female   DOB: 06-22-1976, 48 y.o.   MRN: [id number]  Followed up with patient to continue discussion of which weight loss management meds would be appropriate for her.  BMP resulted and all values WNL, reviewed by PCP.  GLP-1 too cost prohibitive at this time. Kidney function allows for safe use of phentermine . However, patient reports she is still retaining fluid and is waiting to hear from cardio fluid regarding scheduling, keeping controlled with use of lasix  currently.  Provided contact phone numbers for nephrology and cardiology offices. Patient will reach back out if she has any issues with getting scheduled.  Agreed to hold off on starting phentermine  until she meets with cardio.  Jon VEAR Lindau, PharmD Clinical Pharmacist [phone]

## 2024-06-21 ENCOUNTER — Telehealth: Payer: Self-pay

## 2024-06-21 NOTE — Telephone Encounter (Signed)
 Spoke with pt stated that she has a cardiology appointment for 08/29/24, pt was provided the Gi office number to call for appointment. Copied from CRM [id number]. Topic: Referral - Question >> Jun 19, 2024 11:22 AM Revonda D wrote: Reason for CRM: Pt stated that a gastro and cardiology referral was submitted over 2 weeks ago and have been authorized. Pt stated that she contacted the offices she was referred to and they stated they don't have any referrals for her and would need to be re faxed. Pt would like to have the referrals re faxed and would like to receive a callback with an update.

## 2024-06-26 DIAGNOSIS — R7309 Other abnormal glucose: Secondary | ICD-10-CM | POA: Diagnosis not present

## 2024-06-28 ENCOUNTER — Other Ambulatory Visit

## 2024-06-29 NOTE — Progress Notes (Signed)
   06/29/2024  Patient ID: Zoe Swanson, female   DOB: Feb 11, 1976, 48 y.o.   MRN: [id number]  Followed up with patient to continue discussion of which weight loss management meds would be appropriate for her.  GLP-1 too cost prohibitive at this time. Kidney function allows for safe use of phentermine . However, patient reports she is still retaining fluid. Notes seeing swelling in her ankles that comes and goes in the last week and reports she also feels like she has swelling in her stomach at times.  Has follow up with nephro on 10/22 and new patient appt with cardio in Nov.  Patient reports feeling fatigued in the last month and would like an appt with PCP to discuss further prior to seeing nephro.  Counseled patient to weigh herself daily, first thing in the morning after using the restroom and keep a log of those weights. Instructed to bring readings to PCP visit. Counseled on ER precautions.  Agreed that starting phentermine  would not be advised until fluid retention issues are resolved as concern for worsening BP or heart concerns.  Follow Up: PCP on 07/05/24  Jon VEAR Lindau, PharmD Clinical Pharmacist [phone]

## 2024-07-05 ENCOUNTER — Encounter: Payer: Self-pay | Admitting: Internal Medicine

## 2024-07-05 ENCOUNTER — Ambulatory Visit: Admitting: Family Medicine

## 2024-07-09 DIAGNOSIS — I10 Essential (primary) hypertension: Secondary | ICD-10-CM | POA: Diagnosis not present

## 2024-07-10 ENCOUNTER — Ambulatory Visit: Admitting: Family Medicine

## 2024-07-11 ENCOUNTER — Telehealth: Payer: Self-pay | Admitting: Family Medicine

## 2024-07-11 NOTE — Telephone Encounter (Signed)
 Patient requesting Covid Vaccination Rx be sent to:  [id number] [place], [place] -  [address] [place] [address] [phone]

## 2024-07-11 NOTE — Telephone Encounter (Unsigned)
 Copied from CRM [id number]. Topic: Clinical - Medication Refill >> Jul 11, 2024 10:57 AM Zoe Swanson wrote: Medication: 2025/2026 COVID -19 Booster - Requested PFIZER (SARS-COV-2 Vaccination)  Has the patient contacted their pharmacy? Yes (Agent: If no, request that the patient contact the pharmacy for the refill. If patient does not wish to contact the pharmacy document the reason why and proceed with request.) (Agent: If yes, when and what did the pharmacy advise?)  This is the patient's preferred pharmacy:  [id number] [place], [place] -  [address] [place] [address] [phone]   Is this the correct pharmacy for this prescription? Yes If no, delete pharmacy and type the correct one.   Has the prescription been filled recently? No  Is the patient out of the medication? Yes  Has the patient been seen for an appointment in the last year OR does the patient have an upcoming appointment? Yes  Can we respond through MyChart? Yes  Agent: Please be advised that Rx refills may take up to 3 business days. We ask that you follow-up with your pharmacy.

## 2024-07-12 ENCOUNTER — Other Ambulatory Visit: Payer: Self-pay

## 2024-07-12 MED ORDER — COVID-19 MRNA VAC-TRIS(PFIZER) 30 MCG/0.3ML IM SUSY: 0.3000 mL | PREFILLED_SYRINGE | Freq: Once | INTRAMUSCULAR | 0 refills | Status: AC

## 2024-07-13 ENCOUNTER — Encounter: Payer: Self-pay | Admitting: Family Medicine

## 2024-07-13 ENCOUNTER — Ambulatory Visit (AMBULATORY_SURGERY_CENTER): Admitting: *Deleted

## 2024-07-13 ENCOUNTER — Encounter: Payer: Self-pay | Admitting: Internal Medicine

## 2024-07-13 ENCOUNTER — Ambulatory Visit (INDEPENDENT_AMBULATORY_CARE_PROVIDER_SITE_OTHER): Admitting: Family Medicine

## 2024-07-13 VITALS — BP 118/80 | HR 83 | Temp 97.9°F | Wt 205.0 lb

## 2024-07-13 VITALS — Ht 65.5 in | Wt 205.0 lb

## 2024-07-13 DIAGNOSIS — Z800 Family history of malignant neoplasm of digestive organs: Secondary | ICD-10-CM

## 2024-07-13 DIAGNOSIS — Z0000 Encounter for general adult medical examination without abnormal findings: Secondary | ICD-10-CM | POA: Diagnosis not present

## 2024-07-13 DIAGNOSIS — Z860100 Personal history of colon polyps, unspecified: Secondary | ICD-10-CM

## 2024-07-13 DIAGNOSIS — G4730 Sleep apnea, unspecified: Secondary | ICD-10-CM | POA: Diagnosis not present

## 2024-07-13 DIAGNOSIS — Z83711 Family history of hyperplastic colon polyps: Secondary | ICD-10-CM

## 2024-07-13 LAB — CBC WITH DIFFERENTIAL/PLATELET
Basophils Absolute: 0.1 K/uL (ref 0.0–0.1)
Basophils Relative: 1.1 % (ref 0.0–3.0)
Eosinophils Absolute: 0.1 K/uL (ref 0.0–0.7)
Eosinophils Relative: 1.8 % (ref 0.0–5.0)
HCT: 41.8 % (ref 36.0–46.0)
Hemoglobin: 13.7 g/dL (ref 12.0–15.0)
Lymphocytes Relative: 30.8 % (ref 12.0–46.0)
Lymphs Abs: 2.1 K/uL (ref 0.7–4.0)
MCHC: 32.9 g/dL (ref 30.0–36.0)
MCV: 87.4 fl (ref 78.0–100.0)
Monocytes Absolute: 0.5 K/uL (ref 0.1–1.0)
Monocytes Relative: 6.8 % (ref 3.0–12.0)
Neutro Abs: 4.1 K/uL (ref 1.4–7.7)
Neutrophils Relative %: 59.5 % (ref 43.0–77.0)
Platelets: 354 K/uL (ref 150.0–400.0)
RBC: 4.78 Mil/uL (ref 3.87–5.11)
RDW: 15.1 % (ref 11.5–15.5)
WBC: 6.9 K/uL (ref 4.0–10.5)

## 2024-07-13 LAB — TSH: TSH: 1.83 u[IU]/mL (ref 0.35–5.50)

## 2024-07-13 LAB — HEPATIC FUNCTION PANEL
ALT: 30 U/L (ref 0–35)
AST: 18 U/L (ref 0–37)
Albumin: 4.6 g/dL (ref 3.5–5.2)
Alkaline Phosphatase: 103 U/L (ref 39–117)
Bilirubin, Direct: 0.1 mg/dL (ref 0.0–0.3)
Total Bilirubin: 0.4 mg/dL (ref 0.2–1.2)
Total Protein: 7.5 g/dL (ref 6.0–8.3)

## 2024-07-13 LAB — BASIC METABOLIC PANEL WITH GFR
BUN: 14 mg/dL (ref 6–23)
CO2: 25 meq/L (ref 19–32)
Calcium: 9.8 mg/dL (ref 8.4–10.5)
Chloride: 104 meq/L (ref 96–112)
Creatinine, Ser: 0.85 mg/dL (ref 0.40–1.20)
GFR: 80.23 mL/min (ref >60.00)
Glucose, Bld: 100 mg/dL — ABNORMAL HIGH (ref 70–99)
Potassium: 5.2 meq/L — ABNORMAL HIGH (ref 3.5–5.1)
Sodium: 137 meq/L (ref 135–145)

## 2024-07-13 LAB — LIPID PANEL
Cholesterol: 275 mg/dL — ABNORMAL HIGH (ref 0–200)
HDL: 48.8 mg/dL (ref >39.00)
LDL Cholesterol: 201 mg/dL — ABNORMAL HIGH (ref 0–99)
NonHDL: 226.62
Total CHOL/HDL Ratio: 6
Triglycerides: 128 mg/dL (ref 0.0–149.0)
VLDL: 25.6 mg/dL (ref 0.0–40.0)

## 2024-07-13 LAB — HEMOGLOBIN A1C: Hgb A1c MFr Bld: 7 % — ABNORMAL HIGH (ref 4.6–6.5)

## 2024-07-13 MED ORDER — PHENTERMINE HCL 37.5 MG PO CAPS: 37.5000 mg | ORAL_CAPSULE | ORAL | 1 refills | Status: DC

## 2024-07-13 MED ORDER — FUROSEMIDE 20 MG PO TABS: ORAL_TABLET | ORAL | Status: DC

## 2024-07-13 MED ORDER — NA SULFATE-K SULFATE-MG SULF 17.5-3.13-1.6 GM/177ML PO SOLN: 1.0000 | Freq: Once | ORAL | 0 refills | Status: AC

## 2024-07-13 NOTE — Progress Notes (Signed)
 Pt's name and DOB verified at the beginning of the pre-visit with 2 identifiers  Pt denies any difficulty with ambulating,sitting, laying down or rolling side to side  Pt has no issues moving head neck or swallowing  Hx of PONV  No FH of Malignant Hyperthermia  Pt is not on home 02   Pt is not on blood thinners   Pt has frequent issues with constipation RN instructed pt to use Miralax per bottles instructions a week before prep days. Pt states they will  Pt is not on dialysis  Pt denise any abnormal heart rhythms   Pt denies any upcoming cardiac testing  Chart not reviewed by CRNA prior to PV  Visit by phone  Pt states weight is 205 lb   Pt given  both [facility] main # and MD on call # prior to instructions.  Informed pt to come in at the time discussed and is shown on PV instructions.  Pt instructed to use Singlecare.com or GoodRx for a price reduction on prep  Instructed pt where to find PV instructions in My Chart  Instructed pt on all aspects of written instructions including med holds clothing to wear and foods to eat and not eat as well as after procedure legal restrictions and to call MD on call if needed.. Pt states understanding. Instructed pt to review instructions again prior to procedure and call main # given if has any questions or any issues. Pt states they will.

## 2024-07-13 NOTE — Progress Notes (Signed)
 Subjective:    Patient ID: Zoe Swanson, female    DOB: 12-22-1975, 48 y.o.   MRN: [id number]  HPI Here for a well exam. She feels well but but she is frustrated with her weight. She knows part of this is fluid, but not all of it. She is active and she watches her diet. She is now working with a nutritionist also. She was referred to see Dr. Karna Schmidt of [facility], but she will not see her until November. Lastly she thinks she has sleep apnea because she snores and she is a restless sleeper.    Review of Systems  Constitutional: Negative.   HENT: Negative.    Eyes: Negative.   Respiratory: Negative.    Cardiovascular: Negative.   Gastrointestinal: Negative.   Genitourinary:  Negative for decreased urine volume, difficulty urinating, dyspareunia, dysuria, enuresis, flank pain, frequency, hematuria, pelvic pain and urgency.  Musculoskeletal: Negative.   Skin: Negative.   Neurological: Negative.  Negative for headaches.  Psychiatric/Behavioral: Negative.         Objective:   Physical Exam Constitutional:      General: She is not in acute distress.    Appearance: Normal appearance. She is well-developed.  HENT:     Head: Normocephalic and atraumatic.     Right Ear: External ear normal.     Left Ear: External ear normal.     Nose: Nose normal.     Mouth/Throat:     Pharynx: No oropharyngeal exudate.  Eyes:     General: No scleral icterus.    Conjunctiva/sclera: Conjunctivae normal.     Pupils: Pupils are equal, round, and reactive to light.  Neck:     Thyroid : No thyromegaly.     Vascular: No JVD.  Cardiovascular:     Rate and Rhythm: Normal rate and regular rhythm.     Pulses: Normal pulses.     Heart sounds: Normal heart sounds. No murmur heard.    No friction rub. No gallop.  Pulmonary:     Effort: Pulmonary effort is normal. No respiratory distress.     Breath sounds: Normal breath sounds. No wheezing or rales.  Chest:     Chest wall: No  tenderness.  Abdominal:     General: Bowel sounds are normal. There is no distension.     Palpations: Abdomen is soft. There is no mass.     Tenderness: There is no abdominal tenderness. There is no guarding or rebound.  Musculoskeletal:        General: No tenderness. Normal range of motion.     Cervical back: Normal range of motion and neck supple.  Lymphadenopathy:     Cervical: No cervical adenopathy.  Skin:    General: Skin is warm and dry.     Findings: No erythema or rash.  Neurological:     General: No focal deficit present.     Mental Status: She is alert and oriented to person, place, and time.     Cranial Nerves: No cranial nerve deficit.     Motor: No abnormal muscle tone.     Coordination: Coordination normal.     Deep Tendon Reflexes: Reflexes are normal and symmetric. Reflexes normal.  Psychiatric:        Mood and Affect: Mood normal.        Behavior: Behavior normal.        Thought Content: Thought content normal.        Judgment: Judgment normal.  Assessment & Plan:  Well exam. We discussed diet and exercise. Get fasting labs. She will see Nephrology as above. She will try Phentermine  37.5 mg every morning to help with weight loss. Refer to Pulmonology for a sleep evaluation.  Garnette Olmsted, MD

## 2024-07-14 ENCOUNTER — Ambulatory Visit: Payer: Self-pay | Admitting: Family Medicine

## 2024-07-14 DIAGNOSIS — E785 Hyperlipidemia, unspecified: Secondary | ICD-10-CM

## 2024-07-17 ENCOUNTER — Telehealth: Payer: Self-pay | Admitting: *Deleted

## 2024-07-17 NOTE — Telephone Encounter (Signed)
 Copied from CRM [id number]. Topic: Clinical - Prescription Issue >> Jul 17, 2024  8:33 AM Zoe Swanson wrote: Reason for CRM: Patient called in requesting an alternative o the colonoscopy medication. Patient is requesting GO Lightly and Abby C which are covered by her insurance at a 0$ cost. Patient can be reached at [phone] to verify the switch has been made. It can be sent to the pharmacy on file is okay which is:  [id number] - [name], [place] - [address] [name] [address] Phone: [phone] Fax: [phone] Hours: Not open 24 hours

## 2024-07-18 ENCOUNTER — Telehealth: Payer: Self-pay | Admitting: Internal Medicine

## 2024-07-18 ENCOUNTER — Telehealth (AMBULATORY_SURGERY_CENTER): Payer: Self-pay

## 2024-07-18 DIAGNOSIS — Z860100 Personal history of colon polyps, unspecified: Secondary | ICD-10-CM

## 2024-07-18 MED ORDER — PEG 3350-KCL-NA BICARB-NACL 420 G PO SOLR: 4000.0000 mL | Freq: Once | ORAL | 0 refills | Status: AC

## 2024-07-18 NOTE — Telephone Encounter (Signed)
 Spoke with pt advised to call [facility] for further information regarding alternative medication for colonoscopy preparation, pt voiced understanding

## 2024-07-18 NOTE — Telephone Encounter (Signed)
 She needs to take this up with her GI provider

## 2024-07-18 NOTE — Telephone Encounter (Signed)
 Prep was changed per patient request to Golytely. New prep instructions reviewed with patient. All questions answered.  New prep instructions sent via MyChart and mail. Patient verbalizes understanding.

## 2024-07-18 NOTE — Telephone Encounter (Signed)
 Inbound call from patient stating that her insurance will not cover her prep for colon on 10/2. Requesting we send in different prep. Please advise.

## 2024-07-24 DIAGNOSIS — E785 Hyperlipidemia, unspecified: Secondary | ICD-10-CM | POA: Insufficient documentation

## 2024-07-24 NOTE — Addendum Note (Signed)
 Addended by: JOHNNY SENIOR A on: 07/24/2024 12:51 PM   Modules accepted: Orders

## 2024-07-24 NOTE — Telephone Encounter (Signed)
 I did the referral

## 2024-07-26 DIAGNOSIS — E109 Type 1 diabetes mellitus without complications: Secondary | ICD-10-CM | POA: Diagnosis not present

## 2024-07-27 ENCOUNTER — Encounter: Payer: Self-pay | Admitting: Internal Medicine

## 2024-07-27 ENCOUNTER — Ambulatory Visit: Admitting: Internal Medicine

## 2024-07-27 VITALS — BP 137/94 | HR 88 | Temp 98.6°F | Resp 14 | Ht 65.5 in | Wt 205.0 lb

## 2024-07-27 DIAGNOSIS — Z860100 Personal history of colon polyps, unspecified: Secondary | ICD-10-CM

## 2024-07-27 DIAGNOSIS — Z1211 Encounter for screening for malignant neoplasm of colon: Secondary | ICD-10-CM

## 2024-07-27 DIAGNOSIS — K5909 Other constipation: Secondary | ICD-10-CM

## 2024-07-27 MED ORDER — SODIUM CHLORIDE 0.9 % IV SOLN: 500.0000 mL | Freq: Once | INTRAVENOUS | Status: DC

## 2024-07-27 NOTE — Op Note (Signed)
 [facility] Patient Name: Zoe Swanson Procedure Date: 07/27/2024 11:24 AM MRN: [id number] Endoscopist: Norleen SAILOR. Abran , MD, [id number] Age: 48 Referring MD:  Date of Birth: 10/31/1975 Gender: Female Account #: [id number] Procedure:                Colonoscopy Indications:              High risk colon cancer surveillance: Personal                            history of colonic polyps. [place]  2022 Medicines:                Monitored Anesthesia Care Procedure:                Pre-Anesthesia Assessment:                           - Prior to the procedure, a History and Physical                            was performed, and patient medications and                            allergies were reviewed. The patient's tolerance of                            previous anesthesia was also reviewed. The risks                            and benefits of the procedure and the sedation                            options and risks were discussed with the patient.                            All questions were answered, and informed consent                            was obtained. Prior Anticoagulants: The patient has                            taken no anticoagulant or antiplatelet agents. ASA                            Grade Assessment: II - A patient with mild systemic                            disease. After reviewing the risks and benefits,                            the patient was deemed in satisfactory condition to                            undergo the procedure.  After obtaining informed consent, the colonoscope                            was passed under direct vision. Throughout the                            procedure, the patient's blood pressure, pulse, and                            oxygen saturations were monitored continuously. The                            CF [id number] #[id number] was introduced through the anus                            and advanced to the  the cecum, identified by                            appendiceal orifice and ileocecal valve. The                            ileocecal valve, appendiceal orifice, and rectum                            were photographed. The quality of the bowel                            preparation was good. The colonoscopy was performed                            without difficulty. The patient tolerated the                            procedure well. The bowel preparation used was                            SUPREP via split dose instruction. Scope In: 11:41:43 AM Scope Out: 11:58:31 AM Scope Withdrawal Time: 0 hours 11 minutes 16 seconds  Total Procedure Duration: 0 hours 16 minutes 48 seconds  Findings:                 The entire examined colon appeared normal on direct                            and retroflexion views. Complications:            No immediate complications. Estimated blood loss:                            None. Estimated Blood Loss:     Estimated blood loss: none. Impression:               - The entire examined colon is normal on direct and  retroflexion views.                           - No specimens collected. Recommendation:           - Repeat colonoscopy in 10 years for surveillance.                           - Patient has a contact number available for                            emergencies. The signs and symptoms of potential                            delayed complications were discussed with the                            patient. Return to normal activities tomorrow.                            Written discharge instructions were provided to the                            patient.                           - Resume previous diet.                           - Continue present medications. Norleen SAILOR. Abran, MD 07/27/2024 12:03:41 PM This report has been signed electronically.

## 2024-07-27 NOTE — Patient Instructions (Signed)
Resume previous diet.  Continue present medications.  Repeat colonoscopy in 10 years for surveillance.   YOU HAD AN ENDOSCOPIC PROCEDURE TODAY AT THE [facility]:   Refer to the procedure report that was given to you for any specific questions about what was found during the examination.  If the procedure report does not answer your questions, please call your gastroenterologist to clarify.  If you requested that your care partner not be given the details of your procedure findings, then the procedure report has been included in a sealed envelope for you to review at your convenience later.  YOU SHOULD EXPECT: Some feelings of bloating in the abdomen. Passage of more gas than usual.  Walking can help get rid of the air that was put into your GI tract during the procedure and reduce the bloating. If you had a lower endoscopy (such as a colonoscopy or flexible sigmoidoscopy) you may notice spotting of blood in your stool or on the toilet paper. If you underwent a bowel prep for your procedure, you may not have a normal bowel movement for a few days.  Please Note:  You might notice some irritation and congestion in your nose or some drainage.  This is from the oxygen used during your procedure.  There is no need for concern and it should clear up in a day or so.  SYMPTOMS TO REPORT IMMEDIATELY:  Following lower endoscopy (colonoscopy or flexible sigmoidoscopy):  Excessive amounts of blood in the stool  Significant tenderness or worsening of abdominal pains  Swelling of the abdomen that is new, acute  Fever of 100F or higher  For urgent or emergent issues, a gastroenterologist can be reached at any hour by calling [phone]. Do not use MyChart messaging for urgent concerns.    DIET:  We do recommend a small meal at first, but then you may proceed to your regular diet.  Drink plenty of fluids but you should avoid alcoholic beverages for 24 hours.  ACTIVITY:  You should plan to  take it easy for the rest of today and you should NOT DRIVE or use heavy machinery until tomorrow (because of the sedation medicines used during the test).    FOLLOW UP: Our staff will call the number listed on your records the next business day following your procedure.  We will call around 7:15- 8:00 am to check on you and address any questions or concerns that you may have regarding the information given to you following your procedure. If we do not reach you, we will leave a message.     If any biopsies were taken you will be contacted by phone or by letter within the next 1-3 weeks.  Please call us at [phone] if you have not heard about the biopsies in 3 weeks.    SIGNATURES/CONFIDENTIALITY: You and/or your care partner have signed paperwork which will be entered into your electronic medical record.  These signatures attest to the fact that that the information above on your After Visit Summary has been reviewed and is understood.  Full responsibility of the confidentiality of this discharge information lies with you and/or your care-partner. 

## 2024-07-27 NOTE — Progress Notes (Signed)
 HISTORY OF PRESENT ILLNESS:  Zoe Swanson is a 48 y.o. female with colonoscopy in [place]  November 2022.  Polyps.  No pathology identified.  5-year follow-up on report noted.  Sent for follow-up at this time by PCP.  The patient denies any relevant GI complaints.  Denies any abnormal laboratories or studies.  Does have constipation, though this is chronic and unchanged.  REVIEW OF SYSTEMS:  All non-GI ROS negative except for  Past Medical History:  Diagnosis Date   Allergy    Anxiety    Asthma    prn inhaler and neb.   Axillary mass 07/2014   left   Cataract    Chronic kidney disease    CRPS (complex regional pain syndrome), lower limb    left ankle   Gallstones    Hypertension    under better control since adding HCTZ   Migraine    Multiple allergies    Sleep apnea     Past Surgical History:  Procedure Laterality Date   ABDOMINAL HYSTERECTOMY     ANKLE ARTHROTOMY Left 06/02/2011   removal of loose body; anterolateral ligament repair   BREAST BIOPSY  10/26/2006   BREAST LUMPECTOMY W/ NEEDLE LOCALIZATION Left 12/20/2006   BREAST LUMPECTOMY WITH RADIOACTIVE SEED LOCALIZATION Left 08/24/2014   Procedure: LEFT BREAST LUMPECTOMY WITH RADIOACTIVE SEED LOCALIZATION;  Surgeon: Elon Pacini, MD;  Location: [facility];  Service: General;  Laterality: Left;   CESAREAN SECTION  06/30/2009   COLONOSCOPY  08/26/2021   per Dr. Valentin Spark in [facility], single polyp, repeat in 5 yrs   KNEE ARTHROSCOPY Left 10/26/1993   SPINAL CORD STIMULATOR IMPLANT Right 07/20/2013   buttock   TEE WITHOUT CARDIOVERSION N/A 05/20/2017   Procedure: TRANSESOPHAGEAL ECHOCARDIOGRAM (TEE);  Surgeon: Shlomo Wilbert SAUNDERS, MD;  Location: [facility];  Service: Cardiovascular;  Laterality: N/A;   TUBAL LIGATION      Social History LANEY LOUDERBACK  reports that she has never smoked. She has never used smokeless tobacco. She reports current alcohol use. She reports that she does not use  drugs.  family history includes Colon cancer in her maternal uncle and maternal uncle; Colon polyps in her father, maternal aunt, maternal grandfather, maternal grandmother, maternal uncle, mother, paternal aunt, and paternal uncle; Diabetes Mellitus II in her mother; Hypertension in her father and mother.  Allergies  Allergen Reactions   Atorvastatin Other (See Comments)    Possibly headache or a rash; patient unsure   Escitalopram  Other (See Comments)   Hydrocodone  Other (See Comments)   Metoprolol  Other (See Comments)    Headaches and dizziness   Mold Extract [Trichophyton] Hives and Itching   Pollen Extract Hives and Itching   Lisinopril  Cough   Oxycodone -Acetaminophen  Itching   Percocet [Oxycodone -Acetaminophen ] Itching    itching       PHYSICAL EXAMINATION: Vital signs: BP (!) 144/97   Pulse 95   Temp 98.6 F (37 C) (Temporal)   Ht 5' 5.5 (1.664 m)   Wt 205 lb (93 kg)   LMP 09/05/2019   SpO2 100%   BMI 33.59 kg/m  General: Well-developed, well-nourished, no acute distress HEENT: Sclerae are anicteric, conjunctiva pink. Oral mucosa intact Lungs: Clear Heart: Regular Abdomen: soft, nontender, nondistended, no obvious ascites, no peritoneal signs, normal bowel sounds. No organomegaly. Extremities: No edema Psychiatric: alert and oriented x3. Cooperative     ASSESSMENT:  History of colon polyps   PLAN:   Colonoscopy

## 2024-07-27 NOTE — Progress Notes (Signed)
 Vss nad trans to pacu

## 2024-07-28 ENCOUNTER — Telehealth: Payer: Self-pay | Admitting: *Deleted

## 2024-07-28 ENCOUNTER — Telehealth: Payer: Self-pay

## 2024-07-28 NOTE — Telephone Encounter (Signed)
 Reached a work Engineer, technical sales ,not able to leave message

## 2024-07-28 NOTE — Telephone Encounter (Signed)
 Entered in error

## 2024-07-28 NOTE — Telephone Encounter (Signed)
 Copied from CRM [id number]. Topic: Clinical - Prescription Issue >> Jul 27, 2024  1:35 PM Zoe Swanson wrote: Reason for CRM: Patient called in regarding prescription FARXIGA 10 MG TABS tablet, would like a cheaper alternative if that is possible at this prescription has increased

## 2024-07-31 ENCOUNTER — Ambulatory Visit: Admitting: Family Medicine

## 2024-07-31 ENCOUNTER — Encounter: Payer: Self-pay | Admitting: Family Medicine

## 2024-07-31 VITALS — BP 110/78 | HR 87 | Temp 98.5°F | Wt 207.0 lb

## 2024-07-31 DIAGNOSIS — E785 Hyperlipidemia, unspecified: Secondary | ICD-10-CM

## 2024-07-31 DIAGNOSIS — E119 Type 2 diabetes mellitus without complications: Secondary | ICD-10-CM | POA: Diagnosis not present

## 2024-07-31 MED ORDER — EMPAGLIFLOZIN 25 MG PO TABS: 25.0000 mg | ORAL_TABLET | Freq: Every day | ORAL | 3 refills | Status: DC

## 2024-07-31 MED ORDER — FREESTYLE LIBRE 3 PLUS SENSOR MISC: 3 refills | Status: AC

## 2024-07-31 NOTE — Progress Notes (Signed)
   Subjective:    Patient ID: Zoe Swanson, female    DOB: Jul 09, 1976, 48 y.o.   MRN: [id number]  HPI Here to go over her recent lab results. These were remarkable for an A1c of 7.0% and an LDL of 201. She is working on her diet, but she does not have a glucose monitor. Also she does not tolerate statins due to muscle pains, so she tried Zetia a few years ago. This did not help much. She asks to alternative treatments to think about.    Review of Systems  Constitutional: Negative.   Respiratory: Negative.    Cardiovascular: Negative.   Neurological: Negative.        Objective:   Physical Exam Constitutional:      Appearance: She is obese.  Cardiovascular:     Rate and Rhythm: Normal rate and regular rhythm.     Pulses: Normal pulses.     Heart sounds: Normal heart sounds.  Pulmonary:     Effort: Pulmonary effort is normal.     Breath sounds: Normal breath sounds.  Neurological:     Mental Status: She is alert.           Assessment & Plan:  For the type 2 diabetes without complications, we will order her a Freestyle Libre 3 Plus monitor. For the dyslipidemia, we will refer her to the [facility] to consider using Repatha or some other treatments.  Garnette Olmsted, MD

## 2024-07-31 NOTE — Telephone Encounter (Signed)
 Pt had appointment with Dr Johnny this afternoon for this.

## 2024-07-31 NOTE — Addendum Note (Signed)
 Addended by: JOHNNY SENIOR A on: 07/31/2024 07:56 AM   Modules accepted: Orders

## 2024-07-31 NOTE — Telephone Encounter (Signed)
 I stopped the Farxiga and started her on Jardiance

## 2024-08-09 DIAGNOSIS — I10 Essential (primary) hypertension: Secondary | ICD-10-CM | POA: Diagnosis not present

## 2024-08-15 ENCOUNTER — Ambulatory Visit (HOSPITAL_BASED_OUTPATIENT_CLINIC_OR_DEPARTMENT_OTHER)

## 2024-08-16 ENCOUNTER — Encounter (HOSPITAL_BASED_OUTPATIENT_CLINIC_OR_DEPARTMENT_OTHER): Payer: Self-pay | Admitting: Emergency Medicine

## 2024-08-16 ENCOUNTER — Ambulatory Visit (INDEPENDENT_AMBULATORY_CARE_PROVIDER_SITE_OTHER)

## 2024-08-16 ENCOUNTER — Emergency Department (HOSPITAL_BASED_OUTPATIENT_CLINIC_OR_DEPARTMENT_OTHER)
Admission: EM | Admit: 2024-08-16 | Discharge: 2024-08-16 | Disposition: A | Discharge status: Home / Self Care | Attending: Emergency Medicine | Admitting: Emergency Medicine

## 2024-08-16 ENCOUNTER — Other Ambulatory Visit (HOSPITAL_BASED_OUTPATIENT_CLINIC_OR_DEPARTMENT_OTHER): Payer: Self-pay

## 2024-08-16 ENCOUNTER — Ambulatory Visit: Payer: Self-pay

## 2024-08-16 ENCOUNTER — Other Ambulatory Visit: Payer: Self-pay

## 2024-08-16 ENCOUNTER — Encounter (HOSPITAL_BASED_OUTPATIENT_CLINIC_OR_DEPARTMENT_OTHER): Payer: Self-pay

## 2024-08-16 VITALS — BP 129/80 | HR 93 | Ht 65.5 in | Wt 203.5 lb

## 2024-08-16 DIAGNOSIS — R059 Cough, unspecified: Secondary | ICD-10-CM | POA: Insufficient documentation

## 2024-08-16 DIAGNOSIS — J4540 Moderate persistent asthma, uncomplicated: Secondary | ICD-10-CM | POA: Diagnosis not present

## 2024-08-16 DIAGNOSIS — G4719 Other hypersomnia: Secondary | ICD-10-CM

## 2024-08-16 DIAGNOSIS — R21 Rash and other nonspecific skin eruption: Secondary | ICD-10-CM | POA: Diagnosis not present

## 2024-08-16 HISTORY — DX: Type 2 diabetes mellitus without complications: E11.9

## 2024-08-16 LAB — CBG MONITORING, ED: Glucose-Capillary: 120 mg/dL — ABNORMAL HIGH (ref 70–99)

## 2024-08-16 MED ORDER — EPINEPHRINE 0.3 MG/0.3ML IJ SOAJ
INTRAMUSCULAR | 5 refills | Status: AC
  Filled 2024-08-16: qty 2, 30d supply, fill #0

## 2024-08-16 MED ORDER — ALBUTEROL SULFATE HFA 108 (90 BASE) MCG/ACT IN AERS
2.0000 | INHALATION_SPRAY | RESPIRATORY_TRACT | 11 refills | Status: AC | PRN
  Filled 2024-08-16: qty 6.7, 17d supply, fill #0

## 2024-08-16 MED ORDER — PREDNISONE 20 MG PO TABS: ORAL_TABLET | ORAL | 0 refills | Status: DC

## 2024-08-16 MED ORDER — DIPHENHYDRAMINE HCL 25 MG PO CAPS
50.0000 mg | ORAL_CAPSULE | Freq: Once | ORAL | Status: AC
  Administered 2024-08-16: 50 mg via ORAL
  Filled 2024-08-16: qty 2

## 2024-08-16 MED ORDER — FAMOTIDINE 20 MG PO TABS
40.0000 mg | ORAL_TABLET | Freq: Once | ORAL | Status: AC
  Administered 2024-08-16: 40 mg via ORAL
  Filled 2024-08-16: qty 2

## 2024-08-16 MED ORDER — ALBUTEROL SULFATE (2.5 MG/3ML) 0.083% IN NEBU
2.5000 mg | INHALATION_SOLUTION | RESPIRATORY_TRACT | 5 refills | Status: DC | PRN
  Filled 2024-08-16: qty 75, 5d supply, fill #0

## 2024-08-16 MED ORDER — MONTELUKAST SODIUM 10 MG PO TABS
10.0000 mg | ORAL_TABLET | Freq: Every day | ORAL | 3 refills | Status: AC
  Filled 2024-08-16: qty 30, 30d supply, fill #0

## 2024-08-16 MED ORDER — PREDNISONE 50 MG PO TABS
60.0000 mg | ORAL_TABLET | Freq: Once | ORAL | Status: AC
  Administered 2024-08-16: 60 mg via ORAL
  Filled 2024-08-16: qty 1

## 2024-08-16 NOTE — Assessment & Plan Note (Signed)
-    Currently out of her medications and does not follow with a local pulmonologist -  She is agreeable to following with our group; will schedule follow up -  Resume baseline meds- Symbicort BID, Singulair  daily, Albuterol  MDI/nebs, and Epipen  prn- orders sent to [facility] today. -  Patient to bring old records from Pulmonology in [place]  to next appt

## 2024-08-16 NOTE — Telephone Encounter (Signed)
 FYI Spoke with pt states that this morning she started itching her face but after a short while she broke out with itch hives. Pt states that she was triaged and advised to go to the ED for Evaluation. Pt stated that she declined going to the ED. Pt states that she has been taking Benadryl  which has helped some but still has the hives. Denies any SOB or fever, advised to seek medical help if symptoms change.

## 2024-08-16 NOTE — ED Notes (Signed)

## 2024-08-16 NOTE — ED Provider Notes (Signed)
  EMERGENCY DEPARTMENT AT [facility] Provider Note   CSN: [id number] Arrival date & time: 08/16/24  1732     Patient presents with: Allergic Reaction ( With raised bumps )   Zoe Swanson is a 48 y.o. female.   48 yo F with a chief complaint of a rash to her face.  She states she applied something due to her face this morning and then went to a doctor's appointment.  Later noted that her face was feeling uncomfortable.  She also has had a little bit of a hoarse voice and a dry cough.  That started yesterday.  Had a couple loose stools yesterday as well.  No fevers no sick contacts.  Denies difficulty breathing.  Denies nausea vomiting.   Allergic Reaction      Prior to Admission medications   Medication Sig Start Date End Date Taking? Authorizing Provider  predniSONE  (DELTASONE ) 20 MG tablet 2 tabs po daily x 4 days 08/16/24  Yes Emil Share, DO  albuterol  (PROVENTIL ) (2.5 MG/3ML) 0.083% nebulizer solution Inhale 3 mLs (2.5 mg total) by nebulization every 4 (four) hours as needed for wheezing or shortness of breath (asthma). 08/16/24   Charley Conger, PA-C  albuterol  (VENTOLIN  HFA) 108 [phone] Base) MCG/ACT inhaler Inhale 2 puffs into the lungs every 4 (four) hours as needed for wheezing or shortness of breath. 08/16/24   Charley Conger, PA-C  ALPRAZolam  (XANAX ) 1 MG tablet TAKE 1 TABLET BY MOUTH EVERY 6 HOURS AS NEEDED FOR ANXIETY 02/04/24   [name] LABOR, MD  augmented betamethasone  dipropionate (DIPROLENE -AF) 0.05 % cream APPLY TO AFFECTED AREA(S) DAILY TOPICALLY Patient taking differently: as needed. 06/03/20   Livingston Rigg, MD  Continuous Glucose Sensor (FREESTYLE LIBRE 3 PLUS SENSOR) MISC Change sensor every 15 days. 07/31/24   [name] LABOR, MD  cyclobenzaprine  (FLEXERIL ) 10 MG tablet Take 1 tablet (10 mg total) by mouth 3 (three) times daily as needed for muscle spasms. 05/17/24   [name] LABOR, MD  diphenhydrAMINE  (BENADRYL ) 25 MG tablet Take 25 mg by mouth every  6 (six) hours as needed for allergies (allergic reaction).    [provider]  docusate sodium (COLACE CLEAR) 50 MG capsule Take by mouth as needed. 12/24/21   [provider]  EPINEPHrine  0.3 mg/0.3 mL IJ SOAJ injection INJECT INTO THE MIDDLE OF THE OUTER THIGH AND HOLD FOR 3 SECONDS AS NEEDED FOR SEVERE ALLERGIC REACTION THEN CALL 911 IF USED. 08/16/24   Charley Conger, PA-C  ezetimibe (ZETIA) 10 MG tablet Take 10 mg by mouth daily. 10/21/22   [provider]  furosemide  (LASIX ) 20 MG tablet She takes 2 tabs in the mornings and 1 or 2 tabs in the afternoons 07/13/24   [name] LABOR, MD  losartan (COZAAR) 100 MG tablet Take 100 mg by mouth daily. 10/01/21   [provider]  Magnesium Glycinate 100 MG CAPS Take 100 mg by mouth. 03/03/24   [provider]  metoprolol  tartrate (LOPRESSOR ) 100 MG tablet Take 100 mg by mouth 2 (two) times daily. 09/08/23   [provider]  montelukast  (SINGULAIR ) 10 MG tablet Take 1 tablet (10 mg total) by mouth at bedtime. 08/16/24   Charley Conger, PA-C  nortriptyline (PAMELOR) 10 MG capsule Take 30 mg by mouth. 04/13/24   [provider]  phentermine  37.5 MG capsule Take 1 capsule (37.5 mg total) by mouth every morning. 07/13/24   [name] LABOR, MD  POTASSIUM CHLORIDE ER PO Take 10 mg  by mouth daily at 12 noon. Take 2 tablets daily Patient taking differently: Take 10 mg by mouth as needed. Take 2 tablets daily    [provider]  pravastatin (PRAVACHOL) 20 MG tablet Take 20 mg by mouth. 03/03/24   [provider]  triamcinolone  cream (KENALOG ) 0.1 % APPLY TO AFFECTED AREA TWICE A DAY Patient not taking: Reported on 07/31/2024 07/10/22   [name] LABOR, MD    Allergies: Atorvastatin, Escitalopram , Hydrocodone , Metoprolol , Mold extract [trichophyton], Pollen extract, Lisinopril , Oxycodone -acetaminophen , and Percocet [oxycodone -acetaminophen ]    Review of Systems  Updated Vital Signs BP (!) 136/103  (BP Location: Right Arm)   Pulse 89   Temp 98.2 F (36.8 C) (Oral)   Resp 17   Wt 90.7 kg   LMP 09/05/2019   SpO2 98%   BMI 32.78 kg/m   Physical Exam Vitals and nursing note reviewed.  Constitutional:      General: She is not in acute distress.    Appearance: She is well-developed. She is not diaphoretic.  HENT:     Head: Normocephalic and atraumatic.     Comments: Some erythema and hypopigmentation of the skin around the nose and lips.  No obvious intraoral involvement.  No posterior oropharyngeal erythema or edema.  Swollen turbinates posterior nasal drip. Eyes:     Pupils: Pupils are equal, round, and reactive to light.  Cardiovascular:     Rate and Rhythm: Normal rate and regular rhythm.     Heart sounds: No murmur heard.    No friction rub. No gallop.  Pulmonary:     Effort: Pulmonary effort is normal.     Breath sounds: No wheezing or rales.  Abdominal:     General: There is no distension.     Palpations: Abdomen is soft.     Tenderness: There is no abdominal tenderness.  Musculoskeletal:        General: No tenderness.     Cervical back: Normal range of motion and neck supple.  Skin:    General: Skin is warm and dry.  Neurological:     Mental Status: She is alert and oriented to person, place, and time.  Psychiatric:        Behavior: Behavior normal.     (all labs ordered are listed, but only abnormal results are displayed) Labs Reviewed - No data to display  EKG: None  Radiology: No results found.   Procedures   Medications Ordered in the ED  predniSONE  (DELTASONE ) tablet 60 mg (has no administration in time range)  diphenhydrAMINE  (BENADRYL ) capsule 50 mg (has no administration in time range)  famotidine  (PEPCID ) tablet 40 mg (has no administration in time range)                                    Medical Decision Making Risk Prescription drug management.   48 yo F with a chief complaints of a rash to her face.  Started this morning,  after she had applied a product to her face.  She also had been describing a hoarse voice and a bit of a dry cough going on since yesterday.  I think she likely has 2 separate problems.  Likely has a URI as well as a facial rash due to contact dermatitis.  I think it is unlikely the patient has anaphylaxis.  She is well-appearing and nontoxic.  No wheezes no nausea vomiting.  I did offer  her to be observed in the ED.  She is declining and would like to go home at this time.  Steroids antihistamines PCP follow-up.  6:08 PM:  I have discussed the diagnosis/risks/treatment options with the patient and family.  Evaluation and diagnostic testing in the emergency department does not suggest an emergent condition requiring admission or immediate intervention beyond what has been performed at this time.  They will follow up with PCP. We also discussed returning to the ED immediately if new or worsening sx occur. We discussed the sx which are most concerning (e.g., sudden worsening pain, fever, inability to tolerate by mouth) that necessitate immediate return. Medications administered to the patient during their visit and any new prescriptions provided to the patient are listed below.  Medications given during this visit Medications  predniSONE  (DELTASONE ) tablet 60 mg (has no administration in time range)  diphenhydrAMINE  (BENADRYL ) capsule 50 mg (has no administration in time range)  famotidine  (PEPCID ) tablet 40 mg (has no administration in time range)     The patient appears reasonably screen and/or stabilized for discharge and I doubt any other medical condition or other [facility] requiring further screening, evaluation, or treatment in the ED at this time prior to discharge.       Final diagnoses:  Facial rash    ED Discharge Orders          Ordered    predniSONE  (DELTASONE ) 20 MG tablet        08/16/24 1800               Emil Share, DO 08/16/24 1808

## 2024-08-16 NOTE — Progress Notes (Signed)
 @Patient  ID: Zoe Swanson, female    DOB: 1976-09-20, 48 y.o.   MRN: [id number]  Chief Complaint  Patient presents with   Consult    Sleep    Referring provider: [name] LABOR, MD  HPI: Discussed the use of AI scribe software for clinical note transcription with the patient, who gave verbal consent to proceed.  History of Present Illness Zoe Swanson is a 48 year old female who presents with concerns about snoring and sleep disturbances. Her primary care doctor suggested a sleep test due to recent onset of snoring and changes in her sleep pattern.  She describes her snoring as loud and irregular, with episodes where it sounds like she is trying to catch her breath, as noted by her husband. She wakes up around 3 AM and either stays awake or returns to sleep around 5 AM. These issues have worsened over the past year. She experiences excessive daytime sleepiness, sometimes falling asleep while driving, especially after late work shifts. Her Epworth Sleepiness Scale score is 19.  She has a history of diabetes, heart issues, and stage 3 kidney disease. She mentions weight gain and difficulty losing it, despite being on Lasix , which previously helped with weight management. She also has high blood pressure and a loop recorder for monitoring heart rhythms. She has been referred to a cardiologist due to issues with her arteries and veins affecting her kidneys.  She has asthma and uses a Butisol rescue inhaler. She was previously on Singulair  at night but has run out and needs a refill. She also uses a nebulizer and has been on cluster shots in the past, which made her sleepy. She experiences shortness of breath with activity and rest, and a cough, which she attributes to missing medications.  Her social history includes being a Product manager and having a history of a partial hysterectomy. She has twins who are 50 years old and were born prematurely at 12 weeks. She has a history of using  terbutaline during pregnancy.  She also has many environmental allergies, and usually has an Epipen  available.     TEST/EVENTS :  ESS:  19   Split night study completed in 2018:  full results not available but patient reports that it was negative.  Allergies  Allergen Reactions   Atorvastatin Other (See Comments)    Possibly headache or a rash; patient unsure   Escitalopram  Other (See Comments)   Hydrocodone  Other (See Comments)   Metoprolol  Other (See Comments)    Headaches and dizziness   Mold Extract [Trichophyton] Hives and Itching   Pollen Extract Hives and Itching   Lisinopril  Cough   Oxycodone -Acetaminophen  Itching   Percocet [Oxycodone -Acetaminophen ] Itching    itching    Immunization History  Administered Date(s) Administered   Influenza,inj,Quad PF,6+ Mos 11/08/2013, 12/05/2014, 10/14/2018, 09/02/2019   Influenza-Unspecified 06/24/2023   Janssen (J&J) SARS-COV-2 Vaccination 01/03/2019   Meningococcal polysaccharide vaccine (MPSV4) 10/31/2010   PFIZER(Purple Top)SARS-COV-2 Vaccination 09/02/2020   PPD Test 12/21/2011   Rabies, IM 06/11/2020   Td 10/31/2010   Tdap 06/11/2020    Past Medical History:  Diagnosis Date   Allergy    Anxiety    Asthma    prn inhaler and neb.   Axillary mass 07/2014   left   Cataract    Chronic kidney disease    CRPS (complex regional pain syndrome), lower limb    left ankle   Gallstones    Hypertension    under better control since  adding HCTZ   Migraine    Multiple allergies    Sleep apnea     Tobacco History: Social History   Tobacco Use  Smoking Status Never  Smokeless Tobacco Never   Counseling given: Not Answered   Outpatient Medications Prior to Visit  Medication Sig Dispense Refill   albuterol  (PROVENTIL ) (2.5 MG/3ML) 0.083% nebulizer solution Take 3 mLs (2.5 mg total) by nebulization every 4 (four) hours as needed for wheezing or shortness of breath (asthma). 75 mL 5   albuterol  (VENTOLIN  HFA) 108 (90  Base) MCG/ACT inhaler Inhale 2 puffs into the lungs every 4 (four) hours as needed for wheezing or shortness of breath. 18 g 11   ALPRAZolam  (XANAX ) 1 MG tablet TAKE 1 TABLET BY MOUTH EVERY 6 HOURS AS NEEDED FOR ANXIETY 120 tablet 5   augmented betamethasone  dipropionate (DIPROLENE -AF) 0.05 % cream APPLY TO AFFECTED AREA(S) DAILY TOPICALLY (Patient taking differently: as needed.) 30 g 2   Continuous Glucose Sensor (FREESTYLE LIBRE 3 PLUS SENSOR) MISC Change sensor every 15 days. 6 each 3   cyclobenzaprine  (FLEXERIL ) 10 MG tablet Take 1 tablet (10 mg total) by mouth 3 (three) times daily as needed for muscle spasms. 90 tablet 3   diphenhydrAMINE  (BENADRYL ) 25 MG tablet Take 25 mg by mouth every 6 (six) hours as needed for allergies (allergic reaction).     docusate sodium (COLACE CLEAR) 50 MG capsule Take by mouth as needed.     EPINEPHrine  0.3 mg/0.3 mL IJ SOAJ injection INJECT INTO THE MIDDLE OF THE OUTER THIGH AND HOLD FOR 3 SECONDS AS NEEDED FOR SEVERE ALLERGIC REACTION THEN CALL 911 IF USED. 2 each 5   ezetimibe (ZETIA) 10 MG tablet Take 10 mg by mouth daily.     furosemide  (LASIX ) 20 MG tablet She takes 2 tabs in the mornings and 1 or 2 tabs in the afternoons     Ipratropium-Albuterol  (COMBIVENT  RESPIMAT) 20-100 MCG/ACT AERS respimat INHALE ONE INHALATION BY MOUTH EVERY 6 HOURS AS NEEDED FOR WHEEZING 4 g 10   losartan (COZAAR) 100 MG tablet Take 100 mg by mouth daily.     Magnesium Glycinate 100 MG CAPS Take 100 mg by mouth.     metoprolol  tartrate (LOPRESSOR ) 100 MG tablet Take 100 mg by mouth 2 (two) times daily.     montelukast  (SINGULAIR ) 10 MG tablet Take 1 tablet (10 mg total) by mouth at bedtime. 90 tablet 3   nortriptyline (PAMELOR) 10 MG capsule Take 30 mg by mouth.     phentermine  37.5 MG capsule Take 1 capsule (37.5 mg total) by mouth every morning. 90 capsule 1   POTASSIUM CHLORIDE ER PO Take 10 mg by mouth daily at 12 noon. Take 2 tablets daily (Patient taking differently: Take 10  mg by mouth as needed. Take 2 tablets daily)     pravastatin (PRAVACHOL) 20 MG tablet Take 20 mg by mouth.     triamcinolone  cream (KENALOG ) 0.1 % APPLY TO AFFECTED AREA TWICE A DAY (Patient not taking: Reported on 07/31/2024) 30 g 0   No facility-administered medications prior to visit.     Review of Systems: as per HPI  Constitutional:   No  weight loss, night sweats,  Fevers, chills, fatigue, or  lassitude.  HEENT:   No headaches,  Difficulty swallowing,  Tooth/dental problems, or  Sore throat,                No sneezing, itching, ear ache, nasal congestion, post nasal drip,  CV:  No chest pain,  Orthopnea, PND, swelling in lower extremities, anasarca, dizziness, palpitations, syncope.   GI  No heartburn, indigestion, abdominal pain, nausea, vomiting, diarrhea, change in bowel habits, loss of appetite, bloody stools.   Resp: No shortness of breath with exertion or at rest.  No excess mucus, no productive cough,  No non-productive cough,  No coughing up of blood.  No change in color of mucus.  No wheezing.  No chest wall deformity  Skin: no rash or lesions.  GU: no dysuria, change in color of urine, no urgency or frequency.  No flank pain, no hematuria   MS:  No joint pain or swelling.  No decreased range of motion.  No back pain.    Physical Exam  BP 129/80   Pulse 93   Ht 5' 5.5 (1.664 m)   Wt 203 lb 8 oz (92.3 kg)   LMP 09/05/2019   SpO2 96%   BMI 33.35 kg/m   GEN: A/Ox3; pleasant , NAD, well nourished    HEENT:  [place]/AT,  EACs-clear, TMs-wnl, NOSE-clear, THROAT-clear, no lesions, no postnasal drip or exudate noted. Mallampati 4  NECK:  Supple w/ fair ROM; no JVD; normal carotid impulses w/o bruits; no thyromegaly or nodules palpated; no lymphadenopathy.    RESP  Clear  P & A; w/o, wheezes/ rales/ or rhonchi. no accessory muscle use, no dullness to percussion  CARD:  RRR, no m/r/g, no peripheral edema, pulses intact, no cyanosis or clubbing.  GI:   Soft & nt; nml  bowel sounds; no organomegaly or masses detected.   Musco: Warm bil, no deformities or joint swelling noted.   Neuro: alert, no focal deficits noted.    Skin: Warm, no lesions or rashes    Lab Results:  CBC    Component Value Date/Time   WBC 6.9 07/13/2024 0922   RBC 4.78 07/13/2024 0922   HGB 13.7 07/13/2024 0922   HCT 41.8 07/13/2024 0922   PLT 354.0 07/13/2024 0922   MCV 87.4 07/13/2024 0922   MCH 28.2 02/12/2024 0020   MCHC 32.9 07/13/2024 0922   RDW 15.1 07/13/2024 0922   LYMPHSABS 2.1 07/13/2024 0922   MONOABS 0.5 07/13/2024 0922   EOSABS 0.1 07/13/2024 0922   BASOSABS 0.1 07/13/2024 0922    BMET    Component Value Date/Time   NA 137 07/13/2024 0922   K 5.2 (H) 07/13/2024 0922   CL 104 07/13/2024 0922   CO2 25 07/13/2024 0922   GLUCOSE 100 (H) 07/13/2024 0922   BUN 14 07/13/2024 0922   CREATININE 0.85 07/13/2024 0922   CALCIUM 9.8 07/13/2024 0922   GFRNONAA >60 02/12/2024 0020   GFRAA >60 02/02/2018 1708    BNP    Component Value Date/Time   BNP 13.3 10/21/2017 1623    ProBNP No results found for: PROBNP  Imaging: No results found.  Administration History     None           No data to display          No results found for: NITRICOXIDE   Assessment & Plan:  Zoe Swanson is a 48 year old female who presents with concerns about snoring and sleep disturbances. Her primary care doctor suggested a sleep test due to recent onset of snoring and changes in her sleep pattern.  Due to c/o snoring, excessive daytime sleepiness, and ESS of 19, there is significant concern for sleep apnea. Assessment & Plan Excessive daytime sleepiness -  Plan  for HST; ordered today -  Follow up to review results Moderate persistent asthma without complication -  Currently out of her medications and does not follow with a local pulmonologist -  She is agreeable to following with our group; will schedule follow up -  Resume baseline meds- Symbicort BID,  Singulair  daily, Albuterol  MDI/nebs, and Epipen  prn- orders sent to [facility] today. -  Patient to bring old records from Pulmonology in [place]  to next appt   No follow-ups on file.  Candis Dandy, PA-C 08/16/2024

## 2024-08-16 NOTE — Telephone Encounter (Signed)
 Reached out to pt to check to see if she was going to ED she states she is not currently as her head hurts too bad. I did advise she should call EMS she states her 50 year old is home and she has taken another Benadryl  and if things do not improve she will have her child drive her to ED.

## 2024-08-16 NOTE — Patient Instructions (Signed)
 Resume Symbicort twice daily.  Resume Singulair  daily.  Use Albuterol  MDI or neb every 4-6 hours as needed.  Epipens reordered; use as directed.  Complete home sleep test; follow up in 10-12 weeks for results.

## 2024-08-16 NOTE — Telephone Encounter (Signed)
 I read all these notes and it sounds like this is not an emergency situation. She should keep taking Benadryl , and we can see her in our office tomorrow of needed. She should go to the ED if she has any trouble breathing.

## 2024-08-16 NOTE — Telephone Encounter (Signed)
 FYI Only or Action Required?: FYI only for provider.  Patient is followed in Pulmonology for Sleep, last seen on 08/16/2024 by Charley Conger, PA-C.  Called Nurse Triage reporting Facial Swelling.  Symptoms began today.  Interventions attempted: Nothing.  Symptoms are: gradually worsening.  Triage Disposition: See HCP Within 4 Hours (Or PCP Triage)-recommended to ED  Patient/caregiver understands and will follow disposition?: Unsure  E2C2 Pulmonary Triage - Initial Assessment Questions "Chief Complaint (e.g., cough, sob, wheezing, fever, chills, sweat or additional symptoms) *Go to specific symptom protocol after initial questions. Patient was seen in pulmonary office today for sleep consult. Patient states while she was in her appointment, she felt like her face was breaking out into a rash. Endorses burning sensation and felt like her face was swelling. Patient states currently her facial swelling is severe. Currently denies SOB, CP or nausea. Patient states that she has had facial swelling before and oral prednisone  works for her. Patient was calling to see if pulmonary team would write for prednisone . Due to symptoms, Patient is recommended to the ED. Routing note to primary office along with pulmonary.   "How long have symptoms been present?" Started today  Have you tested for COVID or Flu? Note: If not, ask patient if a home test can be taken. If so, instruct patient to call back for positive results. No  MEDICINES:   "Have you used any OTC meds to help with symptoms?" No If yes, ask "What medications?"   "Have you used your inhalers/maintenance medication?" No If yes, "What medications?"   If inhaler, ask "How many puffs and how often?" Note: Review instructions on medication in the chart.   OXYGEN: "Do you wear supplemental oxygen?" No If yes, "How many liters are you supposed to use?"   "Do you monitor your oxygen levels?" No If yes, What is your reading (oxygen  level) today?   What is your usual oxygen saturation reading?  (Note: Pulmonary O2 sats should be 90% or greater)   Copied from CRM #[id number]. Topic: Clinical - Red Word Triage >> Aug 16, 2024 12:42 PM Isabell A wrote: Red Word that prompted transfer to Nurse Triage:  Patient seen this morning - states shes having an allergic reaction to something from the office or home - requesting prednisone , states her face is swelling. Reason for Disposition  Patient sounds very sick or weak to the triager  Answer Assessment - Initial Assessment Questions 1. ONSET: When did the swelling start? (e.g., minutes, hours, days)     Started this morning 2. LOCATION: What part of the face is swollen? (e.g., cheek, entire face, jaw joint area, under jaw)     Entire face 3. SEVERITY: How swollen is it?     Almost severe 4. ITCHING: Is there any itching? If Yes, ask: How much?   (Scale 1-10; mild, moderate or severe)     Yes-moderate 5. PAIN: Is the swelling painful to touch? If Yes, ask: How painful is it?   (Scale 0-10; mild, moderate or severe)     no 6. FEVER: Do you have a fever? If Yes, ask: What is it, how was it measured, and when did it start?      no 7. CAUSE: What do you think is causing the face swelling?     Patient thinks she has come in contact with something that has caused facial swelling 8. NEW MEDICINES: Have there been any new medicines started recently?     no 9. RECURRENT SYMPTOM: Have  you had face swelling before? If Yes, ask: When was the last time? What happened that time?     yes 10. OTHER SYMPTOMS: Do you have any other symptoms? (e.g., leg swelling, toothache)       no  Protocols used: Face Swelling-A-AH

## 2024-08-16 NOTE — ED Notes (Signed)
Pt requested CBG

## 2024-08-16 NOTE — ED Triage Notes (Signed)
 Pt reports facial itching and swelling and lip swelling today Speech clear, no s/s of respiratory distress. Used a new lotion today. Hoarseness reported and noted that started yesterday

## 2024-08-16 NOTE — Discharge Instructions (Signed)
 Please try to avoid what ever you think may have caused this reaction on your face.  I have prescribed steroids for you to take for the next few days.  You can take an antihistamine as well.  Please return for rapid spreading of the rash difficulty breathing or swallowing or if you develop nausea vomiting or diarrhea.

## 2024-08-17 NOTE — Telephone Encounter (Signed)
 Pt was seen at the ED on 08/16/24

## 2024-08-17 NOTE — Telephone Encounter (Signed)
 FYI Pt was seen at the ED for this on 08/16/24

## 2024-08-20 ENCOUNTER — Other Ambulatory Visit: Payer: Self-pay | Admitting: Family Medicine

## 2024-08-21 ENCOUNTER — Telehealth: Payer: Self-pay | Admitting: Pharmacy Technician

## 2024-08-21 ENCOUNTER — Ambulatory Visit (INDEPENDENT_AMBULATORY_CARE_PROVIDER_SITE_OTHER): Admitting: Internal Medicine

## 2024-08-21 ENCOUNTER — Other Ambulatory Visit (HOSPITAL_BASED_OUTPATIENT_CLINIC_OR_DEPARTMENT_OTHER): Payer: Self-pay

## 2024-08-21 ENCOUNTER — Encounter (HOSPITAL_BASED_OUTPATIENT_CLINIC_OR_DEPARTMENT_OTHER): Payer: Self-pay | Admitting: Internal Medicine

## 2024-08-21 ENCOUNTER — Other Ambulatory Visit (HOSPITAL_COMMUNITY): Payer: Self-pay

## 2024-08-21 VITALS — BP 140/94 | HR 66 | Ht 65.5 in | Wt 210.2 lb

## 2024-08-21 DIAGNOSIS — E7849 Other hyperlipidemia: Secondary | ICD-10-CM | POA: Diagnosis not present

## 2024-08-21 DIAGNOSIS — E119 Type 2 diabetes mellitus without complications: Secondary | ICD-10-CM

## 2024-08-21 DIAGNOSIS — T466X5D Adverse effect of antihyperlipidemic and antiarteriosclerotic drugs, subsequent encounter: Secondary | ICD-10-CM

## 2024-08-21 DIAGNOSIS — Z789 Other specified health status: Secondary | ICD-10-CM

## 2024-08-21 DIAGNOSIS — T466X5A Adverse effect of antihyperlipidemic and antiarteriosclerotic drugs, initial encounter: Secondary | ICD-10-CM

## 2024-08-21 MED ORDER — REPATHA SURECLICK 140 MG/ML ~~LOC~~ SOAJ
140.0000 mg | SUBCUTANEOUS | 3 refills | Status: AC
  Filled 2024-08-21 – 2024-08-23 (×2): qty 2, 28d supply, fill #0
  Filled 2024-09-10 – 2024-09-13 (×5): qty 2, 28d supply, fill #1
  Filled 2024-10-05 – 2024-10-12 (×2): qty 2, 28d supply, fill #2
  Filled 2024-11-08: qty 2, 28d supply, fill #3

## 2024-08-21 NOTE — Telephone Encounter (Signed)
 Pharmacy Patient Advocate Encounter   Received notification from Physician's Office that prior authorization for Repatha is required/requested.   Insurance verification completed.   The patient is insured through [facility].   Per test claim: PA required; PA submitted to above mentioned insurance via Latent Key/confirmation #/EOC [id number] Status is pending

## 2024-08-21 NOTE — Patient Instructions (Signed)
 Medication Instructions:  Dr. Mona recommends Repatha. This is an injectable cholesterol medication self-administered once every 14 days. This medication will likely need prior approval with your insurance company, which we will work on. If the medication is not approved initially, we may need to do an appeal with your insurance. If approved, we will provide you with copay and cost information. We'll then send the prescription to your pharmacy. We would have you complete another set of fasting labs between 3-4 months to reassess cholesterol.   Repatha is self-injected once every 14 days in subcutaneous or fatty tissue - such as belly or side/outer/upper thigh. It is best stored in the refrigerator but is stable at room temp up to 28 days. Please take the pen-injector out of fridge about 30 minutes - 1 hour prior to injection, to allow it to warm closer to room temperature.   This medication is very effective in lowering LDL and can lower LPa, as Dr. Mona mentioned. It is also generally well tolerated -- most common reaction may be cold-like symptoms such as runny nose, scratchy throat, as this is an antibody therapy. It is generally self-limiting and after a few doses, your body should have normalized to the medication.   Here is a demo video: [email or web address].[email or web address]   If you need a co-pay card for Repatha: lawsponsor.fr    Patient Assistance:    These foundations have funds at various times.   The [employer]: https://www.panfoundation.org/disease-funds/hypercholesterolemia/ -- can sign up for wait list  The [facility] offers assistance to help pay for medication copays.  They will cover copays for all cholesterol lowering meds, including statins, fibrates, omega-3 fish oils like Vascepa, ezetimibe, Repatha, Praluent, Nexletol, Nexlizet.  The cards are usually good for $2,500 or 12 months, whichever comes first. Our fax # is  [phone] (you will need this to apply) Go to healthwellfoundation.org Click on "Apply Now" Answer questions as to whom is applying (patient or representative) Your disease fund will be "hypercholesterolemia - Medicare access" They will ask questions about finances and which medications you are taking for cholesterol When you submit, the approval is usually within minutes.  You will need to print the card information from the site You will need to show this information to your pharmacy, they will bill your Medicare Part D plan first -then bill [employer] --for the copay.   You can also call them at [phone], although the hold times can be quite long.   *If you need a refill on your cardiac medications before your next appointment, please call your pharmacy*  Lab Work: Return in 3 months - fasting NMR lipoprofile and Lpa   Testing/Procedures: none  Follow-Up: Dr. Michele as planned

## 2024-08-21 NOTE — Progress Notes (Signed)
 LIPID CLINIC CONSULT NOTE  Chief Complaint:  Manage dyslipidemia  Primary Care Physician: [name] LABOR, MD  Primary Cardiologist:  None  HPI:  Zoe Swanson is a 48 y.o. female who is being seen today for the evaluation of dyslipidemia at the request of [name] LABOR, MD. 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  She has a history of high cholesterol as well as hypertension and stage III chronic kidney disease along with other medical problems and chronic back pain issues.  She had previously seen cardiology last in 2018.  She was being treated for the above problems and then moved to the [place]  area.  She was getting care there as well as had some cardiac workup.  It sounds like she was having issues with passing out or possible arrhythmias.  She said she had a loop recorder placed.  She has a general cardiology appointment scheduled in November with Dr. Michele.  Today she was referred for statin intolerance.  She has previously been on atorvastatin but this was discontinued because of rash and subsequently tried pravastatin with similar side effects and was on ezetimibe.  She also noted that neither medication was very effective at lowering her cholesterol.  Recent labs in September 2025 showed total cholesterol 275, triglycerides 128, HDL 48 and LDL 201.  This is highly suggestive of a familial hyperlipidemia.  She does note that number of family members also had very high cholesterol and there is some early cardiovascular disease in her family, some of which are from [place].  PMHx:  Past Medical History:  Diagnosis Date   Allergy    Anxiety    Asthma    prn inhaler and neb.   Axillary mass 07/2014   left   Cataract    Chronic kidney disease    CRPS (complex regional pain syndrome), lower limb    left ankle   Diabetes mellitus without complication (HCC)    Gallstones     Hypertension    under better control since adding HCTZ   Migraine    Multiple allergies    Sleep apnea     Past Surgical History:  Procedure Laterality Date   ABDOMINAL HYSTERECTOMY     ANKLE ARTHROTOMY Left 06/02/2011   removal of loose body; anterolateral ligament repair   BREAST BIOPSY  10/26/2006   BREAST LUMPECTOMY W/ NEEDLE LOCALIZATION Left 12/20/2006   BREAST LUMPECTOMY WITH RADIOACTIVE SEED LOCALIZATION Left 08/24/2014   Procedure: LEFT BREAST LUMPECTOMY WITH RADIOACTIVE SEED LOCALIZATION;  Surgeon: Elon Pacini, MD;  Location: [facility];  Service: General;  Laterality: Left;   CESAREAN SECTION  06/30/2009   COLONOSCOPY  08/26/2021   per Dr. Valentin Spark in [facility], single polyp, repeat in 5 yrs   KNEE ARTHROSCOPY Left 10/26/1993   SPINAL CORD STIMULATOR IMPLANT Right 07/20/2013   buttock   TEE WITHOUT CARDIOVERSION N/A 05/20/2017   Procedure: TRANSESOPHAGEAL ECHOCARDIOGRAM (TEE);  Surgeon: Shlomo Wilbert SAUNDERS, MD;  Location: [facility];  Service: Cardiovascular;  Laterality: N/A;   TUBAL LIGATION      FAMHx:  Family History  Problem Relation Age of Onset   Colon polyps Mother    Hypertension Mother    Diabetes Mellitus II Mother    Colon polyps Father    Hypertension Father    Colon polyps Maternal Aunt    Colon cancer Maternal Uncle  Colon polyps Maternal Uncle    Colon cancer Maternal Uncle    Colon polyps Paternal Aunt    Colon polyps Paternal Uncle    Colon polyps Maternal Grandmother    Colon polyps Maternal Grandfather    Esophageal cancer Neg Hx    Stomach cancer Neg Hx    Rectal cancer Neg Hx     SOCHx:   reports that she has never smoked. She has never used smokeless tobacco. She reports current alcohol use. She reports that she does not use drugs.  ALLERGIES:  Allergies  Allergen Reactions   Atorvastatin Other (See Comments)    Possibly headache or a rash; patient unsure   Escitalopram  Other (See Comments)    Hydrocodone  Other (See Comments)   Metoprolol  Other (See Comments)    Headaches and dizziness   Mold Extract [Trichophyton] Hives and Itching   Pollen Extract Hives and Itching   Lisinopril  Cough   Oxycodone -Acetaminophen  Itching   Percocet [Oxycodone -Acetaminophen ] Itching    itching    ROS: Pertinent items noted in HPI and remainder of comprehensive ROS otherwise negative.  HOME MEDS: Current Outpatient Medications on File Prior to Visit  Medication Sig Dispense Refill   albuterol  (PROVENTIL ) (2.5 MG/3ML) 0.083% nebulizer solution Inhale 3 mLs (2.5 mg total) by nebulization every 4 (four) hours as needed for wheezing or shortness of breath (asthma). 75 mL 5   albuterol  (VENTOLIN  HFA) 108 (90 Base) MCG/ACT inhaler Inhale 2 puffs into the lungs every 4 (four) hours as needed for wheezing or shortness of breath. 6.7 g 11   ALPRAZolam  (XANAX ) 1 MG tablet TAKE 1 TABLET BY MOUTH EVERY 6 HOURS AS NEEDED FOR ANXIETY 120 tablet 5   ASPIRIN LOW DOSE 81 MG tablet Take 81 mg by mouth daily.     augmented betamethasone  dipropionate (DIPROLENE -AF) 0.05 % cream APPLY TO AFFECTED AREA(S) DAILY TOPICALLY (Patient taking differently: as needed.) 30 g 2   Continuous Glucose Sensor (FREESTYLE LIBRE 3 PLUS SENSOR) MISC Change sensor every 15 days. 6 each 3   cyclobenzaprine  (FLEXERIL ) 10 MG tablet Take 1 tablet (10 mg total) by mouth 3 (three) times daily as needed for muscle spasms. 90 tablet 3   diphenhydrAMINE  (BENADRYL ) 25 MG tablet Take 25 mg by mouth every 6 (six) hours as needed for allergies (allergic reaction).     docusate sodium (COLACE CLEAR) 50 MG capsule Take by mouth as needed.     EPINEPHrine  0.3 mg/0.3 mL IJ SOAJ injection INJECT INTO THE MIDDLE OF THE OUTER THIGH AND HOLD FOR 3 SECONDS AS NEEDED FOR SEVERE ALLERGIC REACTION THEN CALL 911 IF USED. 2 each 5   ezetimibe (ZETIA) 10 MG tablet Take 10 mg by mouth daily.     furosemide  (LASIX ) 20 MG tablet She takes 2 tabs in the mornings and 1  or 2 tabs in the afternoons     Magnesium Glycinate 100 MG CAPS Take 100 mg by mouth.     metoprolol  tartrate (LOPRESSOR ) 100 MG tablet Take 100 mg by mouth 2 (two) times daily.     montelukast  (SINGULAIR ) 10 MG tablet Take 1 tablet (10 mg total) by mouth at bedtime. 90 tablet 3   nortriptyline (PAMELOR) 10 MG capsule Take 30 mg by mouth.     phentermine  37.5 MG capsule Take 1 capsule (37.5 mg total) by mouth every morning. 90 capsule 1   POTASSIUM CHLORIDE ER PO Take 10 mg by mouth daily at 12 noon. Take 2 tablets daily (Patient taking differently: Take  10 mg by mouth as needed. Take 2 tablets daily)     predniSONE  (DELTASONE ) 20 MG tablet 2 tabs po daily x 4 days 8 tablet 0   triamcinolone  cream (KENALOG ) 0.1 % APPLY TO AFFECTED AREA TWICE A DAY 30 g 0   valsartan (DIOVAN) 80 MG tablet Take 80 mg by mouth 2 (two) times daily.     pravastatin (PRAVACHOL) 20 MG tablet Take 20 mg by mouth. (Patient not taking: Reported on 08/21/2024)     No current facility-administered medications on file prior to visit.    LABS/IMAGING: No results found for this or any previous visit (from the past 48 hours). No results found.  LIPID PANEL:    Component Value Date/Time   CHOL 275 (H) 07/13/2024 0922   TRIG 128.0 07/13/2024 0922   HDL 48.80 07/13/2024 0922   CHOLHDL 6 07/13/2024 0922   VLDL 25.6 07/13/2024 0922   LDLCALC 201 (H) 07/13/2024 0922   LDLDIRECT 150.4 11/01/2013 0959    No results found for: LIPOA   WEIGHTS: Wt Readings from Last 3 Encounters:  08/21/24 210 lb 3.2 oz (95.3 kg)  08/16/24 203 lb 8 oz (92.3 kg)  07/31/24 207 lb (93.9 kg)    VITALS: BP (!) 140/94   Pulse 66   Ht 5' 5.5 (1.664 m)   Wt 210 lb 3.2 oz (95.3 kg)   LMP 09/05/2019   SpO2 97%   BMI 34.45 kg/m   EXAM: Deferred  EKG: Deferred  ASSESSMENT: Probable familial hyperlipidemia, LDL greater than 190 Statin intolerant-rash Ezetimibe intolerant CKD 3 Diabetes type 2-A1c 7.0%  PLAN: 1.   Ms.  Swanson has a probable familial hyperlipidemia with LDL greater than 190.  Unfortunate she cannot tolerate statins and ezetimibe.  She has issues with chronic kidney disease and type 2 diabetes and has recently been passing out with possible arrhythmias.  She has no known cardiovascular disease although I did review some prior scans which showed no evidence of any coronary calcification although that was many years ago.  I would recommend a PCSK9 inhibitor for her.  I think this is the best option given her prior intolerances to get at least 50% reduction in cholesterol.  Will reach out for prior authorization for this.  Plan repeat labs including NMR and LP(a) in about 3 months.  Thanks for the kind referral.  Vinie KYM Maxcy, MD, [facility], FNLA, FACP    [facility]  Medical Director of the Advanced Lipid Disorders &  Cardiovascular Risk Reduction Clinic Diplomate of the American Board of Clinical Lipidology Attending Cardiologist  Direct Dial: [phone]  Fax: [phone]  Website:  [email or web address] Vinie JAYSON Maxcy 08/21/2024, 8:39 AM

## 2024-08-22 NOTE — Telephone Encounter (Signed)
 Pharmacy Patient Advocate Encounter  Received notification from [facility] that Prior Authorization for repatha has been APPROVED from 08/22/24 to 08/21/27   PA #/Case ID/Reference #: [id number]

## 2024-08-23 ENCOUNTER — Other Ambulatory Visit (HOSPITAL_BASED_OUTPATIENT_CLINIC_OR_DEPARTMENT_OTHER): Payer: Self-pay

## 2024-08-26 DIAGNOSIS — E109 Type 1 diabetes mellitus without complications: Secondary | ICD-10-CM | POA: Diagnosis not present

## 2024-08-29 ENCOUNTER — Other Ambulatory Visit (HOSPITAL_COMMUNITY): Payer: Self-pay

## 2024-08-29 ENCOUNTER — Encounter: Payer: Self-pay | Admitting: Cardiology

## 2024-08-29 ENCOUNTER — Encounter (HOSPITAL_COMMUNITY): Payer: Self-pay

## 2024-08-29 ENCOUNTER — Ambulatory Visit: Attending: Cardiology | Admitting: Cardiology

## 2024-08-29 VITALS — BP 124/88 | HR 101 | Resp 16 | Ht 65.0 in | Wt 203.0 lb

## 2024-08-29 DIAGNOSIS — Z789 Other specified health status: Secondary | ICD-10-CM

## 2024-08-29 DIAGNOSIS — I471 Supraventricular tachycardia, unspecified: Secondary | ICD-10-CM | POA: Diagnosis not present

## 2024-08-29 DIAGNOSIS — I10 Essential (primary) hypertension: Secondary | ICD-10-CM

## 2024-08-29 DIAGNOSIS — E119 Type 2 diabetes mellitus without complications: Secondary | ICD-10-CM

## 2024-08-29 DIAGNOSIS — R002 Palpitations: Secondary | ICD-10-CM | POA: Diagnosis not present

## 2024-08-29 DIAGNOSIS — E7849 Other hyperlipidemia: Secondary | ICD-10-CM

## 2024-08-29 DIAGNOSIS — Z599 Problem related to housing and economic circumstances, unspecified: Secondary | ICD-10-CM

## 2024-08-29 MED ORDER — METOPROLOL TARTRATE 100 MG PO TABS
100.0000 mg | ORAL_TABLET | Freq: Two times a day (BID) | ORAL | 6 refills | Status: DC
  Filled 2024-08-29 – 2024-08-30 (×3): qty 60, 30d supply, fill #0

## 2024-08-29 NOTE — Patient Instructions (Addendum)
 Medication Instructions:  Your physician has recommended you make the following change in your medication:  RESTART Lopressor  100 mg twice daily  *If you need a refill on your cardiac medications before your next appointment, please call your pharmacy*   Follow-Up: At [facility], you and your health needs are our priority.  As part of our continuing mission to provide you with exceptional heart care, our providers are all part of one team.  This team includes your primary Cardiologist (physician) and Advanced Practice Providers or APPs (Physician Assistants and Nurse Practitioners) who all work together to provide you with the care you need, when you need it.  Your next appointment:   3 month(s)  Provider:   Dr. Michele Lanius have been referred to our device clinic to follow your loop recorder   Thank you for choosing [facility]!!   [phone]   Follow up with your primary care provider for your non-cardiac medication refills needs

## 2024-08-29 NOTE — Progress Notes (Signed)
 Cardiology Office Note:    Date:  08/29/2024  NAME:  Zoe Swanson    MRN: [id number] DOB:  26-Oct-1976   PCP:  Zoe Swanson LABOR, MD  Former Cardiology Providers: Dr. Burnard, Dr. Percilla Taghipour [facility])  Primary Cardiologist:  None, [facility] (established care 08/29/2024) Electrophysiologist:  None   Referring MD: Zoe Swanson LABOR, MD  Reason of Consult: Palpitations/SVT  Chief Complaint  Patient presents with   supraventricular tachycardia   Palpitations   New Patient (Initial Visit)    History of Present Illness:    Zoe Swanson is a 48 y.o. African-American female whose past medical history and cardiovascular risk factors includes: Dyslipidemia suggestive of familial hyperlipidemia, statin intolerance, hypertension. She is being seen today for the evaluation of palpitations/SVT at the request of Zoe Swanson LABOR, MD.  Saw Dr. Mona in lipid clinic back in October 2025, progress note reviewed.  Referred to cardiology to establish care given her palpitations.  Palpitations:  Patient describes the symptoms as: a fluttering sensation Onset: several years ago Duration: minutes to hours Frequency:daily  Associated symptoms:  Triggers: more noticeable at night before going to bed (has not been able to afford her metoprolol  recently).  Relieving factors: none Are symptoms associated with syncopal events: no. Hx of syncope while she was in [facility].   She started phentermine  a week ago for weight loss but is considering discontinuing it.  Her past medical workup includes a loop recorder placement, a negative tilt table test, and an echocardiogram. She has been under the care of cardiologists in [place] , including Dr. Gordy Gander, who monitored her loop recorder. However, she has not received regular updates on the loop recorder results.  Factors to consider: History of thyroid  disease: No History of anemia: No Coffee consumption: now none Caffeinated beverages/sodas: no Energy drinks:  no Alcohol use: no Stimulants: phentermine  - recently started.  Illicit drugs: no Weight loss supplements: phentermine  - recently started.  New over-the-counter medications: none Herbal supplements: non Any prior workup: Had Loop recorder placed in May 2025 in [facility] with Dr. Gordy Mainland at [facility].   Current Medications: Current Meds  Medication Sig   ALPRAZolam  (XANAX ) 1 MG tablet TAKE 1 TABLET BY MOUTH EVERY 6 HOURS AS NEEDED FOR ANXIETY   ASPIRIN LOW DOSE 81 MG tablet Take 81 mg by mouth daily.   augmented betamethasone  dipropionate (DIPROLENE -AF) 0.05 % cream APPLY TO AFFECTED AREA(S) DAILY TOPICALLY (Patient taking differently: as needed.)   Continuous Glucose Sensor (FREESTYLE LIBRE 3 PLUS SENSOR) MISC Change sensor every 15 days.   cyclobenzaprine  (FLEXERIL ) 10 MG tablet Take 1 tablet (10 mg total) by mouth 3 (three) times daily as needed for muscle spasms.   diphenhydrAMINE  (BENADRYL ) 25 MG tablet Take 25 mg by mouth every 6 (six) hours as needed for allergies (allergic reaction).   docusate sodium (COLACE CLEAR) 50 MG capsule Take by mouth as needed.   EPINEPHrine  0.3 mg/0.3 mL IJ SOAJ injection INJECT INTO THE MIDDLE OF THE OUTER THIGH AND HOLD FOR 3 SECONDS AS NEEDED FOR SEVERE ALLERGIC REACTION THEN CALL 911 IF USED.   Evolocumab (REPATHA SURECLICK) 140 MG/ML SOAJ Inject 140 mg into the skin every 14 (fourteen) days.   furosemide  (LASIX ) 20 MG tablet She takes 2 tabs in the mornings and 1 or 2 tabs in the afternoons   Magnesium Glycinate 100 MG CAPS Take 100 mg by mouth.   montelukast  (SINGULAIR ) 10 MG tablet Take 1 tablet (10 mg total) by mouth at bedtime.  phentermine  37.5 MG capsule Take 1 capsule (37.5 mg total) by mouth every morning.   POTASSIUM CHLORIDE ER PO Take 10 mg by mouth daily at 12 noon. Take 2 tablets daily (Patient taking differently: Take 10 mg by mouth as needed. Take 2 tablets daily)   triamcinolone  cream (KENALOG ) 0.1 % APPLY TO AFFECTED AREA TWICE A DAY      Allergies:    Atorvastatin, Escitalopram , Hydrocodone , Metoprolol , Mold extract [trichophyton], Pollen extract, Lisinopril , Oxycodone -acetaminophen , and Percocet [oxycodone -acetaminophen ]   Past Medical History: Past Medical History:  Diagnosis Date   Allergy    Anxiety    Asthma    prn inhaler and neb.   Axillary mass 07/2014   left   Cataract    Chronic kidney disease    CRPS (complex regional pain syndrome), lower limb    left ankle   Diabetes mellitus without complication (HCC)    Gallstones    Hypertension    under better control since adding HCTZ   Migraine    Multiple allergies    Sleep apnea     Past Surgical History: Past Surgical History:  Procedure Laterality Date   ABDOMINAL HYSTERECTOMY     ANKLE ARTHROTOMY Left 06/02/2011   removal of loose body; anterolateral ligament repair   BREAST BIOPSY  10/26/2006   BREAST LUMPECTOMY W/ NEEDLE LOCALIZATION Left 12/20/2006   BREAST LUMPECTOMY WITH RADIOACTIVE SEED LOCALIZATION Left 08/24/2014   Procedure: LEFT BREAST LUMPECTOMY WITH RADIOACTIVE SEED LOCALIZATION;  Surgeon: Elon Pacini, MD;  Location: [facility];  Service: General;  Laterality: Left;   CESAREAN SECTION  06/30/2009   COLONOSCOPY  08/26/2021   per Dr. Valentin Spark in [facility], single polyp, repeat in 5 yrs   KNEE ARTHROSCOPY Left 10/26/1993   SPINAL CORD STIMULATOR IMPLANT Right 07/20/2013   buttock   TEE WITHOUT CARDIOVERSION N/A 05/20/2017   Procedure: TRANSESOPHAGEAL ECHOCARDIOGRAM (TEE);  Surgeon: Shlomo Wilbert SAUNDERS, MD;  Location: [facility];  Service: Cardiovascular;  Laterality: N/A;   TUBAL LIGATION      Social History: Social History   Tobacco Use   Smoking status: Never   Smokeless tobacco: Never  Vaping Use   Vaping status: Never Used  Substance Use Topics   Alcohol use: Yes    Alcohol/week: 0.0 standard drinks of alcohol    Comment: occasionally   Drug use: No    Family History: Family History   Problem Relation Age of Onset   Colon polyps Mother    Hypertension Mother    Diabetes Mellitus II Mother    Colon polyps Father    Hypertension Father    Colon polyps Maternal Aunt    Colon cancer Maternal Uncle    Colon polyps Maternal Uncle    Colon cancer Maternal Uncle    Colon polyps Paternal Aunt    Colon polyps Paternal Uncle    Colon polyps Maternal Grandmother    Colon polyps Maternal Grandfather    Esophageal cancer Neg Hx    Stomach cancer Neg Hx    Rectal cancer Neg Hx     ROS:   Review of Systems  Cardiovascular:  Positive for palpitations. Negative for chest pain, claudication, irregular heartbeat, leg swelling, near-syncope, orthopnea, paroxysmal nocturnal dyspnea and syncope.  Respiratory:  Negative for shortness of breath.   Hematologic/Lymphatic: Negative for bleeding problem.    EKGs/Labs/Other Studies Reviewed:   EKG: EKG Interpretation Date/Time:  Tuesday August 29 2024 08:44:39 EST Ventricular Rate:  102 PR Interval:  144 QRS Duration:  78 QT Interval:  346 QTC Calculation: 450 R Axis:   43  Text Interpretation: Sinus tachycardia Possible Left atrial enlargement When compared with ECG of 11-Feb-2024 23:57, Since last tracing rate faster Confirmed by Michele Richardson [phone]) on 08/29/2024 8:47:23 AM  Echocardiogram: 04/2017 LVEF 60 to 65%. Normal wall motion, no regional wall motion abnormalities, grade 1 diastolic dysfunction, see report for additional details   Labs:    Latest Ref Rng & Units 07/13/2024    9:22 AM 02/12/2024   12:20 AM 03/28/2023    5:46 PM  CBC  WBC 4.0 - 10.5 K/uL 6.9  8.2  7.6   Hemoglobin 12.0 - 15.0 g/dL 86.2  87.3  87.7   Hematocrit 36.0 - 46.0 % 41.8  38.3  35.8   Platelets 150.0 - 400.0 K/uL 354.0  285  304        Latest Ref Rng & Units 07/13/2024    9:22 AM 06/09/2024    8:32 AM 02/12/2024   12:20 AM  BMP  Glucose 70 - 99 mg/dL 899  96  874   BUN 6 - 23 mg/dL 14  15  14    Creatinine 0.40 - 1.20 mg/dL 9.14   9.13  9.00   Sodium 135 - 145 mEq/L 137  137  138   Potassium 3.5 - 5.1 mEq/L 5.2  4.1  3.7   Chloride 96 - 112 mEq/L 104  103  104   CO2 19 - 32 mEq/L 25  25  23    Calcium 8.4 - 10.5 mg/dL 9.8  8.9  8.9       Latest Ref Rng & Units 07/13/2024    9:22 AM 06/09/2024    8:32 AM 02/12/2024   12:20 AM  CMP  Glucose 70 - 99 mg/dL 899  96  874   BUN 6 - 23 mg/dL 14  15  14    Creatinine 0.40 - 1.20 mg/dL 9.14  9.13  9.00   Sodium 135 - 145 mEq/L 137  137  138   Potassium 3.5 - 5.1 mEq/L 5.2  4.1  3.7   Chloride 96 - 112 mEq/L 104  103  104   CO2 19 - 32 mEq/L 25  25  23    Calcium 8.4 - 10.5 mg/dL 9.8  8.9  8.9   Total Protein 6.0 - 8.3 g/dL 7.5   6.4   Total Bilirubin 0.2 - 1.2 mg/dL 0.4   0.8   Alkaline Phos 39 - 117 U/L 103   96   AST 0 - 37 U/L 18   112   ALT 0 - 35 U/L 30   61     Lab Results  Component Value Date   CHOL 275 (H) 07/13/2024   HDL 48.80 07/13/2024   LDLCALC 201 (H) 07/13/2024   LDLDIRECT 150.4 11/01/2013   TRIG 128.0 07/13/2024   CHOLHDL 6 07/13/2024   No results for input(s): LIPOA in the last 8760 hours. No components found for: NTPROBNP No results for input(s): PROBNP in the last 8760 hours. Recent Labs    07/13/24 0922  TSH 1.83    Physical Exam:    Today's Vitals   08/29/24 0839  BP: 124/88  Pulse: (!) 101  Resp: 16  SpO2: 95%  Weight: 203 lb (92.1 kg)  Height: 5' 5 (1.651 m)   Body mass index is 33.78 kg/m. Wt Readings from Last 3 Encounters:  08/29/24 203 lb (92.1 kg)  08/21/24 210 lb 3.2 oz (95.3 kg)  08/16/24 203 lb 8 oz (92.3 kg)    Physical Exam  Constitutional: No distress.  hemodynamically stable  Neck: No JVD present.  Cardiovascular: Regular rhythm, S1 normal and S2 normal. Tachycardia present. Exam reveals no gallop, no S3 and no S4.  No murmur heard. Pulmonary/Chest: Effort normal and breath sounds normal. No stridor. She has no wheezes. She has no rales.  Musculoskeletal:        General: No edema.     Cervical  back: Neck supple.  Skin: Skin is warm.     Impression & Recommendation(s):  Impression:   ICD-10-CM   1. Palpitations  R00.2     2. Supraventricular tachycardia  I47.10 EKG 12-Lead    3. Familial hyperlipidemia  E78.49     4. Statin intolerance  Z78.9     5. Type 2 diabetes mellitus treated without insulin (HCC)  E11.9     6. Benign hypertension  I10     7. Financial difficulties  Z59.9 Referral to HRT/VAS Care Navigation       Recommendation(s):  Palpitations Supraventricular tachycardia Stated that she was diagnosed with SVT while in [place].  She was placed metoprolol  tartrate 100 mg po bid and has done well.  Do to finances she has not taken metoprolol  recently - likely the underlying causes of recurrent symptoms.  Also was recently started on phentermine  which could be contributing as well, patient considering discontinuation. Given her history of syncope while she was in [place]  she had a loop recorder implant.  Will have to transfer the data to our practice/device clinic to longitudinally monitor.  Will put a referral for device clinic/EP Also requesting records from Dr. Baird office, last office note, tilt table test, coronary CTA results as well as echocardiogram  Familial hyperlipidemia Statin intolerance Recently started on Repatha. Has been intolerant to statin therapy and Zetia was ineffective. Seen by lipid clinic  Type 2 diabetes mellitus treated without insulin (HCC) Re emphasized importance of glycemic control  Benign hypertension Office blood pressures are well-controlled. Cardiology following peripherally, managed by primary care provider.  Financial difficulties Lost job after [employer] leave. Currently unemployed. Placed referral for social work navigator.   Orders Placed:  Orders Placed This Encounter  Procedures   Referral to HRT/VAS Care Navigation    Referral Priority:   Routine    Referral Type:   Consultation    Referral Reason:    HRT/VAS Care Navigation    Number of Visits Requested:   1   EKG 12-Lead     Final Medication List:    Meds ordered this encounter  Medications   metoprolol  tartrate (LOPRESSOR ) 100 MG tablet    Sig: Take 1 tablet (100 mg total) by mouth 2 (two) times daily.    Dispense:  60 tablet    Refill:  6    Medications Discontinued During This Encounter  Medication Reason   predniSONE  (DELTASONE ) 20 MG tablet Patient Preference   ezetimibe (ZETIA) 10 MG tablet Patient Preference   nortriptyline (PAMELOR) 10 MG capsule Patient Preference   metoprolol  tartrate (LOPRESSOR ) 100 MG tablet Reorder     Current Outpatient Medications:    ALPRAZolam  (XANAX ) 1 MG tablet, TAKE 1 TABLET BY MOUTH EVERY 6 HOURS AS NEEDED FOR ANXIETY, Disp: 120 tablet, Rfl: 5   ASPIRIN LOW DOSE 81 MG tablet, Take 81 mg by mouth daily., Disp: , Rfl:    augmented betamethasone  dipropionate (DIPROLENE -AF) 0.05 % cream, APPLY TO AFFECTED AREA(S) DAILY TOPICALLY (Patient taking differently: as  needed.), Disp: 30 g, Rfl: 2   Continuous Glucose Sensor (FREESTYLE LIBRE 3 PLUS SENSOR) MISC, Change sensor every 15 days., Disp: 6 each, Rfl: 3   cyclobenzaprine  (FLEXERIL ) 10 MG tablet, Take 1 tablet (10 mg total) by mouth 3 (three) times daily as needed for muscle spasms., Disp: 90 tablet, Rfl: 3   diphenhydrAMINE  (BENADRYL ) 25 MG tablet, Take 25 mg by mouth every 6 (six) hours as needed for allergies (allergic reaction)., Disp: , Rfl:    docusate sodium (COLACE CLEAR) 50 MG capsule, Take by mouth as needed., Disp: , Rfl:    EPINEPHrine  0.3 mg/0.3 mL IJ SOAJ injection, INJECT INTO THE MIDDLE OF THE OUTER THIGH AND HOLD FOR 3 SECONDS AS NEEDED FOR SEVERE ALLERGIC REACTION THEN CALL 911 IF USED., Disp: 2 each, Rfl: 5   Evolocumab (REPATHA SURECLICK) 140 MG/ML SOAJ, Inject 140 mg into the skin every 14 (fourteen) days., Disp: 6 mL, Rfl: 3   furosemide  (LASIX ) 20 MG tablet, She takes 2 tabs in the mornings and 1 or 2 tabs in the  afternoons, Disp: , Rfl:    Magnesium Glycinate 100 MG CAPS, Take 100 mg by mouth., Disp: , Rfl:    montelukast  (SINGULAIR ) 10 MG tablet, Take 1 tablet (10 mg total) by mouth at bedtime., Disp: 90 tablet, Rfl: 3   phentermine  37.5 MG capsule, Take 1 capsule (37.5 mg total) by mouth every morning., Disp: 90 capsule, Rfl: 1   POTASSIUM CHLORIDE ER PO, Take 10 mg by mouth daily at 12 noon. Take 2 tablets daily (Patient taking differently: Take 10 mg by mouth as needed. Take 2 tablets daily), Disp: , Rfl:    triamcinolone  cream (KENALOG ) 0.1 %, APPLY TO AFFECTED AREA TWICE A DAY, Disp: 30 g, Rfl: 0   albuterol  (PROVENTIL ) (2.5 MG/3ML) 0.083% nebulizer solution, Inhale 3 mLs (2.5 mg total) by nebulization every 4 (four) hours as needed for wheezing or shortness of breath (asthma)., Disp: 75 mL, Rfl: 5   albuterol  (VENTOLIN  HFA) 108 (90 Base) MCG/ACT inhaler, Inhale 2 puffs into the lungs every 4 (four) hours as needed for wheezing or shortness of breath. (Patient not taking: Reported on 08/29/2024), Disp: 6.7 g, Rfl: 11   metoprolol  tartrate (LOPRESSOR ) 100 MG tablet, Take 1 tablet (100 mg total) by mouth 2 (two) times daily., Disp: 60 tablet, Rfl: 6   pravastatin (PRAVACHOL) 20 MG tablet, Take 20 mg by mouth. (Patient not taking: Reported on 08/29/2024), Disp: , Rfl:    valsartan (DIOVAN) 80 MG tablet, Take 80 mg by mouth 2 (two) times daily. (Patient not taking: Reported on 08/29/2024), Disp: , Rfl:   Consent:   NA  Disposition:   3 month follow up - sooner if needed.   Her questions and concerns were addressed to her satisfaction. She voices understanding of the recommendations provided during this encounter.    Signed, Madonna Michele HAS, [facility]  A Division of [facility] [address] [place], [place] [address]

## 2024-08-30 ENCOUNTER — Telehealth: Payer: Self-pay | Admitting: Licensed Clinical Social Worker

## 2024-08-30 ENCOUNTER — Other Ambulatory Visit (HOSPITAL_COMMUNITY): Payer: Self-pay

## 2024-08-30 ENCOUNTER — Other Ambulatory Visit (HOSPITAL_BASED_OUTPATIENT_CLINIC_OR_DEPARTMENT_OTHER): Payer: Self-pay

## 2024-08-30 NOTE — Progress Notes (Signed)
 Heart and Vascular Care Navigation  08/30/2024  Zoe Swanson 04/21/1976 [id number]  Reason for Referral: financial challenges   Engaged with patient by telephone for initial visit for Heart and Vascular Care Coordination.                                                                                                   Assessment:                                     LCSW was able to reach pt today at [phone]. Introduced self, role, reason for call. Pt confirmed full name and DOB, current home address and PCP. Resides with spouse. Coaches track and field part time, had a job in [place]  she recently moved back home though and has found financially managing the costs of her medications in particular to be difficult. Her spouse works full time, she has access to transportation ad usually drives herself unless she is feeling unwell. She has seen the same PCP for years.   LCSW shared with commercial insurance there are a few options to help save money. First, pt can ensure she is using preferred pharmacy, appears it is [facility]. She shares that she [facility] but medications were still too pricey. Secondly, we discussed using cash prices. Many medications are affordable at cash price at [facility] pharmacy and she can compare this with copays through insurance. For instance her metoprolol  is $5 at [facility], she'd like this switched to [facility] pharmacy as closer to her home. Pt open to reviewing GoodRx and comparing medications there, and [facility] pharmacy.   LCSW also will connect pt with food, utility, and housing resources as her spouse is carrying most of the bills with his job. No additional questions at this time.   HRT/VAS Care Coordination     Patients Home Cardiology Office [facility]   Outpatient Care Team Social Worker   Social Worker Name: Zoe Swanson, [place], [phone]   Living arrangements for the past 2 months Single Family Home   Lives with: Spouse   Patient Current  Insurance Coverage Commercial Insurance   Patient Has Concern With Paying Medical Bills No   Does Patient Have Prescription Coverage? Yes   Patient Prescription Assistance Programs Other   Other Assistance Programs Medications discussed GoodRx and using [facility] pharmacy for reduced cash price if more competitive than [facility] with insurance   Home Assistive Devices/Equipment None       Social History:                                                                             SDOH Screenings   Food Insecurity: Food Insecurity Present (08/30/2024)  Housing: Low Risk  (08/30/2024)  Transportation Needs: No Transportation Needs (08/30/2024)  Utilities: Patient Declined (08/30/2024)  Depression (PHQ2-9): High Risk (05/17/2024)  Financial Resource Strain: Medium Risk (08/30/2024)  Social Connections: Unknown (03/06/2022)   Received from [facility]  Tobacco Use: Low Risk  (08/29/2024)  Health Literacy: Adequate Health Literacy (08/30/2024)    SDOH Interventions: Financial Resources:  Surveyor, Quantity Strain Interventions: [facility] Provided (food, utility and housing assistance)  Food Insecurity:  Food Insecurity Interventions: [facility] Provided (actor mailed to pt)  Housing Insecurity:  Housing Interventions: [facility] Provided (rent/utility assistance provided)  Transportation:   Transportation Interventions: Intervention Not Indicated    Other Care Navigation Interventions:     Provided Pharmacy assistance resources Other- discussed GoodRx and cash price at [employer] for more affordable options. Shared some meds may be filled at 90 day supplies as well which could make them more expensive    Follow-up plan:   LCSW messaged Arlana Molt, [employer] who was able to assist with sending metoprolol  at [facility] pharmacy for $5 cash price/30 days. I also have mailed my card, [facility] discount pharmacy list, goodrx physical card, utility assistance resources, as well  as information about credit counseling and housing assistance if needed. At this time pt does not meet criteria for Medicaid or pt assistance through Patient Assistance fund. LCSW encouraged pt to call me as needed moving forward.

## 2024-09-03 ENCOUNTER — Encounter: Payer: Self-pay | Admitting: Cardiology

## 2024-09-04 ENCOUNTER — Other Ambulatory Visit (HOSPITAL_BASED_OUTPATIENT_CLINIC_OR_DEPARTMENT_OTHER): Payer: Self-pay

## 2024-09-09 ENCOUNTER — Other Ambulatory Visit (HOSPITAL_BASED_OUTPATIENT_CLINIC_OR_DEPARTMENT_OTHER): Payer: Self-pay

## 2024-09-09 DIAGNOSIS — I10 Essential (primary) hypertension: Secondary | ICD-10-CM | POA: Diagnosis not present

## 2024-09-10 ENCOUNTER — Other Ambulatory Visit (HOSPITAL_BASED_OUTPATIENT_CLINIC_OR_DEPARTMENT_OTHER): Payer: Self-pay

## 2024-09-11 ENCOUNTER — Other Ambulatory Visit (HOSPITAL_BASED_OUTPATIENT_CLINIC_OR_DEPARTMENT_OTHER): Payer: Self-pay

## 2024-09-13 ENCOUNTER — Other Ambulatory Visit: Payer: Self-pay

## 2024-09-13 ENCOUNTER — Other Ambulatory Visit (HOSPITAL_BASED_OUTPATIENT_CLINIC_OR_DEPARTMENT_OTHER): Payer: Self-pay

## 2024-09-22 ENCOUNTER — Emergency Department (HOSPITAL_BASED_OUTPATIENT_CLINIC_OR_DEPARTMENT_OTHER)
Admission: EM | Admit: 2024-09-22 | Discharge: 2024-09-23 | Disposition: A | Attending: Emergency Medicine | Admitting: Emergency Medicine

## 2024-09-22 DIAGNOSIS — R531 Weakness: Secondary | ICD-10-CM | POA: Insufficient documentation

## 2024-09-22 DIAGNOSIS — Z5321 Procedure and treatment not carried out due to patient leaving prior to being seen by health care provider: Secondary | ICD-10-CM | POA: Insufficient documentation

## 2024-09-22 DIAGNOSIS — E162 Hypoglycemia, unspecified: Secondary | ICD-10-CM | POA: Insufficient documentation

## 2024-09-22 LAB — COMPREHENSIVE METABOLIC PANEL WITH GFR
ALT: 13 U/L (ref 0–44)
AST: 16 U/L (ref 15–41)
Albumin: 4.3 g/dL (ref 3.5–5.0)
Alkaline Phosphatase: 101 U/L (ref 38–126)
Anion gap: 13 (ref 5–15)
BUN: 18 mg/dL (ref 6–20)
CO2: 22 mmol/L (ref 22–32)
Calcium: 8.9 mg/dL (ref 8.9–10.3)
Chloride: 103 mmol/L (ref 98–111)
Creatinine, Ser: 0.82 mg/dL (ref 0.44–1.00)
GFR, Estimated: >60 mL/min (ref >60)
Glucose, Bld: 132 mg/dL — ABNORMAL HIGH (ref 70–99)
Potassium: 3.6 mmol/L (ref 3.5–5.1)
Sodium: 137 mmol/L (ref 135–145)
Total Bilirubin: 0.2 mg/dL (ref 0.0–1.2)
Total Protein: 7.3 g/dL (ref 6.5–8.1)

## 2024-09-22 LAB — CBC
HCT: 42.2 % (ref 36.0–46.0)
Hemoglobin: 14.3 g/dL (ref 12.0–15.0)
MCH: 29.1 pg (ref 26.0–34.0)
MCHC: 33.9 g/dL (ref 30.0–36.0)
MCV: 85.8 fL (ref 80.0–100.0)
Platelets: 377 K/uL (ref 150–400)
RBC: 4.92 MIL/uL (ref 3.87–5.11)
RDW: 14.7 % (ref 11.5–15.5)
WBC: 8.6 K/uL (ref 4.0–10.5)
nRBC: 0 % (ref 0.0–0.2)

## 2024-09-22 LAB — CBG MONITORING, ED: Glucose-Capillary: 144 mg/dL — ABNORMAL HIGH (ref 70–99)

## 2024-09-22 NOTE — ED Triage Notes (Signed)
 Pt reports 'feeling off' since 0300 this morning, reports fluctuations in BP, blood sugar (lowest 56 today per pt). Endorses feeling tired yesterday. Called PCP nurse and she recommended calling 911. EMS came to house to evaluate and stated pt's vitals were good. Pt refused transportation but EMS recommended being evaluated. Pt stating 'I'm tired'. Denies diabetic medications.

## 2024-09-26 NOTE — Progress Notes (Deleted)
   Electrophysiology Office Note:   Date:  09/26/2024  ID:  Zoe Swanson, DOB [birth year], MRN [id number]  Primary Cardiologist: None Primary Heart Failure: None Electrophysiologist: None  {Click to update primary MD,subspecialty MD or APP then REFRESH:1}    History of Present Illness:   Zoe Swanson is a 48 y.o. female with h/o hyperlipidemia, statin intolerance, hypertension, palpitation/SVT seen today for  for Electrophysiology evaluation of Loop recorder at the request of Zoe Swanson.   Palpitations are described as a fluttering sensation.  She has been having palpitations for many years and the episodes last for minutes to hours.  She has daily symptoms.  She notices them more frequently before going to bed at night.  She has not been able to afford her metoprolol  recently.  In the past, she has had a negative tilt table test.    Review of systems complete and found to be negative unless listed in HPI.   Device History: {INDUSTRY:28136} loop recorder implanted *** for palpitations  EP Information / Studies Reviewed:    {EKGtoday:28818}        Risk Assessment/Calculations:            Physical Exam:   VS:  LMP 09/05/2019    Wt Readings from Last 3 Encounters:  09/22/24 199 lb (90.3 kg)  08/29/24 203 lb (92.1 kg)  08/21/24 210 lb 3.2 oz (95.3 kg)     GEN: Well nourished, well developed in no acute distress NECK: No JVD; No carotid bruits CARDIAC: {EPRHYTHM:28826}, no murmurs, rubs, gallops RESPIRATORY:  Clear to auscultation without rales, wheezing or rhonchi  ABDOMEN: Soft, non-tender, non-distended EXTREMITIES:  No edema; No deformity   ILR Interrogation- reviewed in detail today,  See PACEART report  ASSESSMENT AND PLAN:    Palpitations s/p {INDUSTRY:28136} Loop recorder Normal device function See Pace Art report No changes today  2.  Hyperlipidemia: On Repatha  per primary cardiology  3.  Hypertension:***  {Click here to Review PMH, Prob List,  Meds, Allergies, SHx, FHx  :1}   Follow up with {EPMDS:28135::EP Team} {EPFOLLOW LE:71826}  Signed, Zoe Waterfield Gladis Norton, MD

## 2024-09-27 ENCOUNTER — Ambulatory Visit: Admitting: Cardiology

## 2024-10-02 ENCOUNTER — Emergency Department (HOSPITAL_BASED_OUTPATIENT_CLINIC_OR_DEPARTMENT_OTHER)

## 2024-10-02 ENCOUNTER — Ambulatory Visit: Payer: Self-pay

## 2024-10-02 ENCOUNTER — Emergency Department (HOSPITAL_COMMUNITY)

## 2024-10-02 ENCOUNTER — Encounter (HOSPITAL_BASED_OUTPATIENT_CLINIC_OR_DEPARTMENT_OTHER): Payer: Self-pay | Admitting: Emergency Medicine

## 2024-10-02 ENCOUNTER — Other Ambulatory Visit: Payer: Self-pay

## 2024-10-02 ENCOUNTER — Emergency Department (HOSPITAL_BASED_OUTPATIENT_CLINIC_OR_DEPARTMENT_OTHER)
Admission: EM | Admit: 2024-10-02 | Discharge: 2024-10-02 | Disposition: A | Discharge status: Home / Self Care | Source: Ambulatory Visit | Attending: Emergency Medicine | Admitting: Emergency Medicine

## 2024-10-02 ENCOUNTER — Other Ambulatory Visit

## 2024-10-02 DIAGNOSIS — E1122 Type 2 diabetes mellitus with diabetic chronic kidney disease: Secondary | ICD-10-CM | POA: Diagnosis not present

## 2024-10-02 DIAGNOSIS — R200 Anesthesia of skin: Secondary | ICD-10-CM | POA: Diagnosis not present

## 2024-10-02 DIAGNOSIS — N189 Chronic kidney disease, unspecified: Secondary | ICD-10-CM | POA: Diagnosis not present

## 2024-10-02 DIAGNOSIS — G4489 Other headache syndrome: Secondary | ICD-10-CM | POA: Diagnosis not present

## 2024-10-02 DIAGNOSIS — Z743 Need for continuous supervision: Secondary | ICD-10-CM | POA: Diagnosis not present

## 2024-10-02 DIAGNOSIS — I6523 Occlusion and stenosis of bilateral carotid arteries: Secondary | ICD-10-CM | POA: Diagnosis not present

## 2024-10-02 DIAGNOSIS — Z7982 Long term (current) use of aspirin: Secondary | ICD-10-CM | POA: Diagnosis not present

## 2024-10-02 DIAGNOSIS — I129 Hypertensive chronic kidney disease with stage 1 through stage 4 chronic kidney disease, or unspecified chronic kidney disease: Secondary | ICD-10-CM | POA: Diagnosis not present

## 2024-10-02 DIAGNOSIS — R4182 Altered mental status, unspecified: Secondary | ICD-10-CM | POA: Diagnosis not present

## 2024-10-02 DIAGNOSIS — R079 Chest pain, unspecified: Secondary | ICD-10-CM

## 2024-10-02 DIAGNOSIS — R519 Headache, unspecified: Secondary | ICD-10-CM | POA: Diagnosis not present

## 2024-10-02 DIAGNOSIS — R410 Disorientation, unspecified: Secondary | ICD-10-CM | POA: Diagnosis not present

## 2024-10-02 DIAGNOSIS — R0989 Other specified symptoms and signs involving the circulatory and respiratory systems: Secondary | ICD-10-CM | POA: Diagnosis not present

## 2024-10-02 DIAGNOSIS — R531 Weakness: Secondary | ICD-10-CM | POA: Diagnosis not present

## 2024-10-02 DIAGNOSIS — R29818 Other symptoms and signs involving the nervous system: Secondary | ICD-10-CM | POA: Diagnosis not present

## 2024-10-02 DIAGNOSIS — R2990 Unspecified symptoms and signs involving the nervous system: Secondary | ICD-10-CM

## 2024-10-02 LAB — COMPREHENSIVE METABOLIC PANEL WITH GFR
ALT: 22 U/L (ref 0–44)
AST: 23 U/L (ref 15–41)
Albumin: 4.3 g/dL (ref 3.5–5.0)
Alkaline Phosphatase: 115 U/L (ref 38–126)
Anion gap: 10 (ref 5–15)
BUN: 11 mg/dL (ref 6–20)
CO2: 25 mmol/L (ref 22–32)
Calcium: 9.4 mg/dL (ref 8.9–10.3)
Chloride: 104 mmol/L (ref 98–111)
Creatinine, Ser: 0.9 mg/dL (ref 0.44–1.00)
GFR, Estimated: >60 mL/min (ref >60)
Glucose, Bld: 99 mg/dL (ref 70–99)
Potassium: 4.1 mmol/L (ref 3.5–5.1)
Sodium: 139 mmol/L (ref 135–145)
Total Bilirubin: 0.4 mg/dL (ref 0.0–1.2)
Total Protein: 7.5 g/dL (ref 6.5–8.1)

## 2024-10-02 LAB — I-STAT VENOUS BLOOD GAS, ED
Acid-Base Excess: 2 mmol/L (ref 0.0–2.0)
Bicarbonate: 27 mmol/L (ref 20.0–28.0)
Calcium, Ion: 1.1 mmol/L — ABNORMAL LOW (ref 1.15–1.40)
HCT: 42 % (ref 36.0–46.0)
Hemoglobin: 14.3 g/dL (ref 12.0–15.0)
O2 Saturation: 47 %
Potassium: 4.2 mmol/L (ref 3.5–5.1)
Sodium: 138 mmol/L (ref 135–145)
TCO2: 28 mmol/L (ref 22–32)
pCO2, Ven: 44.3 mmHg (ref 44–60)
pH, Ven: 7.393 (ref 7.25–7.43)
pO2, Ven: 26 mmHg — CL (ref 32–45)

## 2024-10-02 LAB — CBC
HCT: 41.4 % (ref 36.0–46.0)
Hemoglobin: 13.9 g/dL (ref 12.0–15.0)
MCH: 29 pg (ref 26.0–34.0)
MCHC: 33.6 g/dL (ref 30.0–36.0)
MCV: 86.3 fL (ref 80.0–100.0)
Platelets: 349 K/uL (ref 150–400)
RBC: 4.8 MIL/uL (ref 3.87–5.11)
RDW: 14.6 % (ref 11.5–15.5)
WBC: 7.3 K/uL (ref 4.0–10.5)
nRBC: 0 % (ref 0.0–0.2)

## 2024-10-02 LAB — CBG MONITORING, ED: Glucose-Capillary: 77 mg/dL (ref 70–99)

## 2024-10-02 LAB — URINALYSIS, ROUTINE W REFLEX MICROSCOPIC
Bilirubin Urine: NEGATIVE
Glucose, UA: NEGATIVE mg/dL
Hgb urine dipstick: NEGATIVE
Ketones, ur: NEGATIVE mg/dL
Leukocytes,Ua: NEGATIVE
Nitrite: NEGATIVE
Protein, ur: NEGATIVE mg/dL
Specific Gravity, Urine: 1.009 (ref 1.005–1.030)
pH: 6 (ref 5.0–8.0)

## 2024-10-02 LAB — URINE DRUG SCREEN
Amphetamines: NEGATIVE
Barbiturates: NEGATIVE
Benzodiazepines: POSITIVE — AB
Cocaine: NEGATIVE
Fentanyl: NEGATIVE
Methadone Scn, Ur: NEGATIVE
Opiates: NEGATIVE
Tetrahydrocannabinol: NEGATIVE

## 2024-10-02 LAB — DIFFERENTIAL
Abs Immature Granulocytes: 0.03 K/uL (ref 0.00–0.07)
Basophils Absolute: 0.1 K/uL (ref 0.0–0.1)
Basophils Relative: 1 %
Eosinophils Absolute: 0.2 K/uL (ref 0.0–0.5)
Eosinophils Relative: 2 %
Immature Granulocytes: 0 %
Lymphocytes Relative: 34 %
Lymphs Abs: 2.4 K/uL (ref 0.7–4.0)
Monocytes Absolute: 0.5 K/uL (ref 0.1–1.0)
Monocytes Relative: 6 %
Neutro Abs: 4.1 K/uL (ref 1.7–7.7)
Neutrophils Relative %: 57 %

## 2024-10-02 LAB — TROPONIN I (HIGH SENSITIVITY): Troponin I (High Sensitivity): 3 ng/L (ref <18)

## 2024-10-02 LAB — AMMONIA: Ammonia: 15 umol/L (ref 9–35)

## 2024-10-02 MED ORDER — IOHEXOL 350 MG/ML SOLN
100.0000 mL | Freq: Once | INTRAVENOUS | Status: AC | PRN
  Administered 2024-10-02: 75 mL via INTRAVENOUS

## 2024-10-02 MED ORDER — LORAZEPAM 1 MG PO TABS: 0.5000 mg | ORAL_TABLET | ORAL | Status: DC | PRN

## 2024-10-02 MED ORDER — DEXAMETHASONE SOD PHOSPHATE PF 10 MG/ML IJ SOLN
10.0000 mg | Freq: Once | INTRAMUSCULAR | Status: AC
  Administered 2024-10-02: 10 mg via INTRAVENOUS

## 2024-10-02 MED ORDER — DIPHENHYDRAMINE HCL 50 MG/ML IJ SOLN
12.5000 mg | Freq: Once | INTRAMUSCULAR | Status: AC
  Administered 2024-10-02: 12.5 mg via INTRAVENOUS
  Filled 2024-10-02: qty 1

## 2024-10-02 MED ORDER — ACETAMINOPHEN 500 MG PO TABS
1000.0000 mg | ORAL_TABLET | Freq: Once | ORAL | Status: AC
  Administered 2024-10-02: 1000 mg via ORAL
  Filled 2024-10-02: qty 2

## 2024-10-02 MED ORDER — KETOROLAC TROMETHAMINE 15 MG/ML IJ SOLN
15.0000 mg | Freq: Once | INTRAMUSCULAR | Status: AC
  Administered 2024-10-02: 15 mg via INTRAVENOUS
  Filled 2024-10-02: qty 1

## 2024-10-02 MED ORDER — METOCLOPRAMIDE HCL 5 MG/ML IJ SOLN
10.0000 mg | Freq: Once | INTRAMUSCULAR | Status: AC
  Administered 2024-10-02: 10 mg via INTRAVENOUS
  Filled 2024-10-02: qty 2

## 2024-10-02 NOTE — ED Triage Notes (Signed)
 Pt from [facility] via [facility] for MRI. She is A&O x4. CT negative with clear blood work. NIHSS 0.  20G R AC.  Vital signs on arrival: 99% RA, 69, 138/90 (102).

## 2024-10-02 NOTE — Telephone Encounter (Signed)
    FYI Only or Action Required?: FYI only for provider: ED advised.  Patient was last seen in primary care on 07/31/2024 by Zoe Swanson LABOR, MD.  Called Nurse Triage reporting Medication Problem and Chest Pain.  Symptoms began today.  Interventions attempted: Nothing.  Symptoms are: unchanged.  Triage Disposition: Call EMS 911 Now  Patient/caregiver understands and will follow disposition?: YesCopied from CRM [id number]. Topic: Clinical - Red Word Triage >> Oct 02, 2024  8:41 AM Zoe Swanson wrote: Red Word that prompted transfer to Nurse Triage: fatigue and chest pain and burns (?) Currently taking Repatha ; has taken four shots so far. Reason for Disposition  [1] Loss of speech or garbled speech AND [2] sudden onset AND [3] present now  Difficult to awaken or acting confused (e.g., disoriented, slurred speech)  Answer Assessment - Initial Assessment Questions 1. SYMPTOM: What is the main symptom you are concerned about? (e.g., weakness, numbness)     Patient called to report problem with Repatha : fatigue, intermittent heart burn and chest pain. Patient stated I feel confused. 2. ONSET: When did this start? (e.g., minutes, hours, days; while sleeping) Today  Patient appears confused. Nurse asked pt's husband if normal for her and he said no.  Offered to call 911, pt and pt's spouse declined and will go to ED for further evaluation.  Protocols used: Neurologic Deficit-A-AH

## 2024-10-02 NOTE — ED Notes (Signed)
 [facility] at bedside

## 2024-10-02 NOTE — ED Notes (Signed)
Blue top tube sent to lab. 

## 2024-10-02 NOTE — Consult Note (Addendum)
 [facility] Telemedicine Consult   Requesting Provider: Jerrol Consult Participants: Husband, nurse Location of the provider: [facility] Location of the patient: [facility]  This consult was provided via telemedicine with 2-way video and audio communication. The patient/family was informed that care would be provided in this way and agreed to receive care in this manner.    Chief Complaint: Confusion, headache, left sided numbness  HPI: 48 year old female with a history of migraine, DM, HTN, OSA, CKD, CRPS who presents with complaint of headache, confusion and left sided numbness.  History obtained from patient and husband.  Patient seems to have awakened at baseline today with chest pain.  As the morning progressed while on the phone developed some confusion.  Headache and left sided complaints developed after.   Patient has a history of migraines but does not report any associated focal symptoms in the past.  Husband reports he is not aware since she just goes to bed and has him deal with family matters until she feels better.  Reports headache as having started in the back and now has radiated forward and to the left side of her head.  Has associated photophobia and undergoes much of the exam with her eyes closed.  Headache usually relieved with Benadryl  of Motrin.   LKW: 10/02/2024 at 0900 tpa given?: No, mild, nondisabling symptoms.   IR Thrombectomy? No, No target lesion identified Modified Rankin Scale: 0-Completely asymptomatic and back to baseline post- stroke Time of Page: 1157 Time of teleneurologist evaluation: 1158  Exam: Vitals:   10/02/24 1017  BP: (!) 146/89  Pulse: 75  Resp: 17  Temp: 97.7 F (36.5 C)  SpO2: 100%    General: NAD  1A: Level of Consciousness - 0 1B: Ask Month and Age - 1 1C: 'Blink Eyes' & 'Squeeze Hands' - 0 2: Test Horizontal Extraocular Movements - 0 3: Test Visual Fields - 0 4: Test Facial Palsy - 0 5A: Test Left Arm Motor Drift -  0 5B: Test Right Arm Motor Drift - 0 6A: Test Left Leg Motor Drift - 1 6B: Test Right Leg Motor Drift - 0 7: Test Limb Ataxia - 0 8: Test Sensation - 1 9: Test Language/Aphasia- 0 10: Test Dysarthria - 0 11: Test Extinction/Inattention - 0 NIHSS score: 3   Imaging Reviewed:  CT HEAD WITHOUT 10/02/2024 12:05:54 PM   TECHNIQUE: CT of the head was performed without the administration of intravenous contrast. Automated exposure control, iterative reconstruction, and/or weight based adjustment of the mA/kV was utilized to reduce the radiation dose to as low as reasonably achievable.   COMPARISON: None available.   CLINICAL HISTORY: Neuro deficit, acute, stroke suspected.   FINDINGS:   BRAIN AND VENTRICLES: No acute intracranial hemorrhage. No mass effect or midline shift. No extra-axial fluid collection. No evidence of acute infarct. No hydrocephalus.   ORBITS: No acute abnormality.   SINUSES AND MASTOIDS: No acute abnormality.   SOFT TISSUES AND SKULL: No acute skull fracture. No acute soft tissue abnormality.   Alberta Stroke Program Early CT Score (ASPECTS) Ganglionic (caudate, IC, lentiform nucleus, insula, M1-M3): 7 Supraganglionic (M4-M6): 3 Total: 10   Please note the above findings were discussed with Dr. Jerrol at 12:15 pm 10/02/2024.   IMPRESSION: 1. No acute intracranial abnormality. 2. ASPECTS: 10.  CTA HEAD AND NECK WITHOUT AND WITH 10/02/2024 12:18:25 PM   TECHNIQUE: CTA of the head and neck was performed without and with the administration of 75 mL of iohexol  (OMNIPAQUE ) 350 MG/ML  injection 100 mL IOHEXOL  350 MG/ML SOLN. Multiplanar 2D and/or 3D reformatted images are provided for review. Automated exposure control, iterative reconstruction, and/or weight based adjustment of the mA/kV was utilized to reduce the radiation dose to as low as reasonably achievable. Stenosis of the internal carotid arteries measured using NASCET criteria.    COMPARISON: None available   CLINICAL HISTORY: Neuro deficit, acute, stroke suspected.   FINDINGS:   CTA NECK: AORTIC ARCH AND ARCH VESSELS: There is common origin of the brachiocephalic artery and left common carotid artery from the aortic arch. No dissection or arterial injury. No significant stenosis of the brachiocephalic or subclavian arteries.   CERVICAL CAROTID ARTERIES: There is minimal atheromatous disease within the cervical segments of the internal carotid arteries with no appreciable stenosis. No dissection or arterial injury.   CERVICAL VERTEBRAL ARTERIES: No dissection, arterial injury, or significant stenosis.   LUNGS AND MEDIASTINUM: Unremarkable.   SOFT TISSUES: There are few shotty cervical lymph nodes present bilaterally.   BONES: No acute abnormality.   CTA HEAD: ANTERIOR CIRCULATION: No significant stenosis of the internal carotid arteries. No significant stenosis of the anterior cerebral arteries. No significant stenosis of the middle cerebral arteries. No aneurysm. There is no evidence of emergent large vessel occlusion or flow-limiting stenosis.   POSTERIOR CIRCULATION: No significant stenosis of the posterior cerebral arteries. No significant stenosis of the basilar artery. No significant stenosis of the vertebral arteries. No aneurysm.   OTHER: No dural venous sinus thrombosis on this non-dedicated study.   IMPRESSION: 1. No large vessel occlusion, hemodynamically significant stenosis, or aneurysm in the head or neck. 2. Minimal atheromatous disease within the cervical segments of the internal carotid arteries with no appreciable stenosis.    Labs reviewed in epic and pertinent values follow: CBG 77   Assessment: 48 year old female with a history of migraine, DM, HTN, OSA, CKD, CRPS who presents with complaint of headache, confusion and left sided numbness.  History obtained from patient and husband.  Patient seems to have awakened  at baseline today with chest pain.  As the morning progressed while on the phone developed some confusion.  Headache and left sided complaints developed after.   Head CT personally reviewed and shows no acute changes.  CTA of the head and neck personally reviewed and shows no evidence of LVO.  Symptoms nondisabling but also concerned about etiology being secondary to complicated migraine. Due to nondisabling symptoms thrombolytics not administered.  Recommendations:  ASA 81mg  daily Headache cocktail If no improvement with resolution of headache would obtain MRI of the brain without contrast to rule out acute infarct Continue frequent neuro checks.  If patient worsens neurologically prior to 1330 would re-evaluate for thrombolytic indication.      This patient is receiving care for possible acute neurological changes. Care by this provider at the time of service included time for direct evaluation via telemedicine, review of medical records, imaging studies and discussion of findings with providers, the patient and/or family.  Case discussed with Dr. Jerrol Sonny Hock, MD Neurology   If 8pm- 8am, please page neurology on call as listed in AMION.

## 2024-10-02 NOTE — ED Triage Notes (Signed)
 Pt endorses confusion, and HTN. Was referred by pcp RN to be evaluated at ED the patient also reports HA. Tx previously for same

## 2024-10-02 NOTE — ED Provider Notes (Addendum)
 Patient sent here for MRI of brain but unfortunately the spinal simulator patient cannot have the study.  Patient states that her symptoms have resolved at this point.  Patient did have chest pain and troponin here was negative.  EKG shows normal sinus rhythm without signs of acute ischemic changes.  Low suspicion for ACS.  Her chest discomfort was more of a burning sensation without associated dizziness, diaphoresis, nausea or vomiting.  Discussed case with Dr. Deedra who has reviewed the patient's record.  Feels that is safe for patient to home at this time without her study.  Discussed with patient and her significant other at bedside.  Return precautions given   Zoe Faden, MD 10/02/24 1711    Zoe Faden, MD 10/02/24 1900

## 2024-10-02 NOTE — ED Notes (Signed)
 Called [facility] to transport the patient to [facility] for MRI--r/o CVA

## 2024-10-02 NOTE — ED Provider Notes (Signed)
 [place] EMERGENCY DEPARTMENT AT [facility] Provider Note   CSN: [id number] Arrival date & time: 10/02/24  1012     Patient presents with: Altered Mental Status   Zoe Swanson is a 48 y.o. female.    Altered Mental Status    48 year old female with medical history significant for generalized anxiety disorder, HTN, CKD, SVT, HLD, DM2 presenting to the emergency department with a chief complaint of confusion.  The patient initially had a brief episode of chest discomfort this morning and had called her PCP to schedule an appointment.  On the phone the patient developed confusion and it was advised that she present to the emergency department for further evaluation.  Additional history is provided by significant other who is present bedside.  The patient's last known normal was around 0930.  She has developed a headache as well as some difficulty getting words out.  No fevers or chills.  She endorses numbness along the left side of her face around her mouth as well as numbness in her left arm.  Prior to Admission medications   Medication Sig Start Date End Date Taking? Authorizing Provider  albuterol  (PROVENTIL ) (2.5 MG/3ML) 0.083% nebulizer solution Inhale 3 mLs (2.5 mg total) by nebulization every 4 (four) hours as needed for wheezing or shortness of breath (asthma). 08/16/24   Charley Conger, PA-C  albuterol  (VENTOLIN  HFA) 108 (90 Base) MCG/ACT inhaler Inhale 2 puffs into the lungs every 4 (four) hours as needed for wheezing or shortness of breath. Patient not taking: Reported on 08/29/2024 08/16/24   Charley Conger, PA-C  ALPRAZolam  (XANAX ) 1 MG tablet TAKE 1 TABLET BY MOUTH EVERY 6 HOURS AS NEEDED FOR ANXIETY 08/21/24   [name] LABOR, MD  ASPIRIN LOW DOSE 81 MG tablet Take 81 mg by mouth daily. 08/02/24   [provider]  augmented betamethasone  dipropionate (DIPROLENE -AF) 0.05 % cream APPLY TO AFFECTED AREA(S) DAILY TOPICALLY Patient taking differently: as needed.  06/03/20   Livingston Rigg, MD  Continuous Glucose Sensor (FREESTYLE LIBRE 3 PLUS SENSOR) MISC Change sensor every 15 days. 07/31/24   [name] LABOR, MD  cyclobenzaprine  (FLEXERIL ) 10 MG tablet Take 1 tablet (10 mg total) by mouth 3 (three) times daily as needed for muscle spasms. 05/17/24   [name] LABOR, MD  diphenhydrAMINE  (BENADRYL ) 25 MG tablet Take 25 mg by mouth every 6 (six) hours as needed for allergies (allergic reaction).    [provider]  docusate sodium (COLACE CLEAR) 50 MG capsule Take by mouth as needed. 12/24/21   [provider]  EPINEPHrine  0.3 mg/0.3 mL IJ SOAJ injection INJECT INTO THE MIDDLE OF THE OUTER THIGH AND HOLD FOR 3 SECONDS AS NEEDED FOR SEVERE ALLERGIC REACTION THEN CALL 911 IF USED. 08/16/24   Charley Conger, PA-C  Evolocumab  (REPATHA  SURECLICK) 140 MG/ML SOAJ Inject 140 mg into the skin every 14 (fourteen) days. 08/21/24   Hilty, Vinie JAYSON, MD  furosemide  (LASIX ) 20 MG tablet She takes 2 tabs in the mornings and 1 or 2 tabs in the afternoons 07/13/24   [name] LABOR, MD  Magnesium Glycinate 100 MG CAPS Take 100 mg by mouth. 03/03/24   [provider]  metoprolol  tartrate (LOPRESSOR ) 100 MG tablet Take 1 tablet (100 mg total) by mouth 2 (two) times daily. 08/29/24   Tolia, Sunit, DO  montelukast  (SINGULAIR ) 10 MG tablet Take 1 tablet (10 mg total) by mouth at bedtime. 08/16/24   Charley Conger, PA-C  phentermine  37.5 MG capsule Take 1  capsule (37.5 mg total) by mouth every morning. 07/13/24   [name] LABOR, MD  POTASSIUM CHLORIDE ER PO Take 10 mg by mouth daily at 12 noon. Take 2 tablets daily Patient taking differently: Take 10 mg by mouth as needed. Take 2 tablets daily    [provider]  pravastatin (PRAVACHOL) 20 MG tablet Take 20 mg by mouth. Patient not taking: Reported on 08/29/2024 03/03/24   [provider]  triamcinolone  cream (KENALOG ) 0.1 % APPLY TO AFFECTED AREA TWICE A DAY 07/10/22   [name] LABOR, MD  valsartan (DIOVAN)  80 MG tablet Take 80 mg by mouth 2 (two) times daily. Patient not taking: Reported on 08/29/2024 06/17/24   [provider]    Allergies: Atorvastatin, Escitalopram , Hydrocodone , Metoprolol , Mold extract [trichophyton], Pollen extract, Lisinopril , Oxycodone -acetaminophen , and Percocet [oxycodone -acetaminophen ]    Review of Systems  Updated Vital Signs BP 112/68   Pulse 72   Temp 97.7 F (36.5 C) (Oral)   Resp 16   Wt 89.4 kg   LMP 09/05/2019   SpO2 99%   BMI 31.80 kg/m   Physical Exam Vitals and nursing note reviewed.  Constitutional:      General: She is not in acute distress.    Comments: GCS 14, ABC intact  HENT:     Head: Normocephalic and atraumatic.  Eyes:     Conjunctiva/sclera: Conjunctivae normal.     Pupils: Pupils are equal, round, and reactive to light.  Neck:     Comments: Range of motion of the neck intact actively without pain, no meningismus Cardiovascular:     Rate and Rhythm: Normal rate and regular rhythm.  Pulmonary:     Effort: Pulmonary effort is normal. No respiratory distress.  Abdominal:     General: There is no distension.     Tenderness: There is no guarding.  Musculoskeletal:        General: No deformity or signs of injury.     Cervical back: Neck supple.  Skin:    Findings: No lesion or rash.  Neurological:     Mental Status: She is alert.     Comments:  CRANIAL NERVES:    CN 2 (Optic): Visual fields intact to confrontation.  CN 3,4,6 (EOM): Pupils equal and reactive to light. Full extraocular eye movement without nystagmus.  CN 5 (Trigeminal): Facial sensation is diminished on the left, no weakness of masticatory muscles.  CN 7 (Facial): Slight left facial droop CN 8 (Auditory): Auditory acuity grossly normal.  CN 9,10 (Glossophar): The uvula is midline, the palate elevates symmetrically.  CN 11 (spinal access): Normal sternocleidomastoid and trapezius strength.  CN 12 (Hypoglossal): The tongue is midline. No atrophy or  fasciculations.[name]   MOTOR:  Muscle Strength: 5/5RUE, 4/5LUE, 5/5RLE, 4.5/5LLE  REFLEXES: No clonus.   COORDINATION:   No tremor.   SENSATION:   Intact to light touch all four extremities, diminished L hemibody  GAIT: Gait not assessed      (all labs ordered are listed, but only abnormal results are displayed) Labs Reviewed  URINALYSIS, ROUTINE W REFLEX MICROSCOPIC - Abnormal; Notable for the following components:      Result Value   Color, Urine COLORLESS (*)    All other components within normal limits  URINE DRUG SCREEN - Abnormal; Notable for the following components:   Benzodiazepines POSITIVE (*)    All other components within normal limits  I-STAT VENOUS BLOOD GAS, ED - Abnormal; Notable for the following components:   pO2, Ven 26 (*)  Calcium, Ion 1.10 (*)    All other components within normal limits  COMPREHENSIVE METABOLIC PANEL WITH GFR  CBC  DIFFERENTIAL  AMMONIA  PROTIME-INR  APTT  CBG MONITORING, ED    EKG: EKG Interpretation Date/Time:  Monday October 02 2024 10:44:02 EST Ventricular Rate:  76 PR Interval:  156 QRS Duration:  102 QT Interval:  412 QTC Calculation: 464 R Axis:   65  Text Interpretation: Sinus rhythm Probable anterolateral infarct, old Confirmed by Jerrol Agent ([id number]) on 10/02/2024 11:23:19 AM  Radiology: CT Head: IMPRESSION:  1. No acute intracranial abnormality.  2. ASPECTS: 10.   CTA Head and Neck: IMPRESSION:  1. No large vessel occlusion, hemodynamically significant stenosis, or aneurysm  in the head or neck.  2. Minimal atheromatous disease within the cervical segments of the internal  carotid arteries with no appreciable stenosis.     Procedures   Medications Ordered in the ED  LORazepam  (ATIVAN ) tablet 0.5 mg (has no administration in time range)  iohexol  (OMNIPAQUE ) 350 MG/ML injection 100 mL (75 mLs Intravenous Contrast Given 10/02/24 1205)  metoCLOPramide  (REGLAN ) injection 10 mg (10 mg Intravenous Given  10/02/24 1344)  diphenhydrAMINE  (BENADRYL ) injection 12.5 mg (12.5 mg Intravenous Given 10/02/24 1344)  acetaminophen  (TYLENOL ) tablet 1,000 mg (1,000 mg Oral Given 10/02/24 1340)  dexamethasone  (DECADRON ) injection 10 mg (10 mg Intravenous Given 10/02/24 1344)  ketorolac  (TORADOL ) 15 MG/ML injection 15 mg (15 mg Intravenous Given 10/02/24 1344)                                    Medical Decision Making Amount and/or Complexity of Data Reviewed Labs: ordered. Radiology: ordered.  Risk OTC drugs. Prescription drug management.    48 year old female with medical history significant for generalized anxiety disorder, HTN, CKD, SVT, HLD, DM2 presenting to the emergency department with a chief complaint of confusion.  The patient initially had a brief episode of chest discomfort this morning and had called her PCP to schedule an appointment.  On the phone the patient developed confusion and it was advised that she present to the emergency department for further evaluation.  Additional history is provided by significant other who is present bedside.  The patient's last known normal was around 0930.  She has developed a headache as well as some difficulty getting words out.  No fevers or chills.  She endorses numbness along the left side of her face around her mouth as well as numbness in her left arm.  On arrival, the patient was afebrile, not tachycardic or tachypneic, BP 146/89, saturating her percent on room air.  CBG 77 on arrival.  In the setting of the patient's symptoms and presentation exam findings, code stroke was initiated.  Patient was taken to CT imaging for code stroke imaging which resulted negative by both CT code stroke as well as CTA head and neck.  Laboratory evaluation to include UDS, VBG, ammonia, urinalysis, CMP, CBC collected and unremarkable.  Patient endorsing headache, has no meningismus on exam, is afebrile.  Patient with mild headache, consider complex migraine.  Low concern  for subarachnoid hemorrhage, patient obtain CT imaging within 6 hours to rule this out.    Neurology consult: Spoke with Dr. Germaine who recommended MRI imaging of the brain, did not feel patient symptoms were severe enough for TNK given low NIH, did recommend treatment for likely complex migraine.  Recommended ER to ER transfer for MRI  imaging to rule out stroke.  Spoke with Dr. Dreama who accepted the patient in ER to ER transfer, patient stable at time of transfer.     Final diagnoses:  Stroke-like symptoms  Altered mental status, unspecified altered mental status type  Acute nonintractable headache, unspecified headache type    ED Discharge Orders     None          Jerrol Agent, MD 10/02/24 1504

## 2024-10-02 NOTE — Discharge Instructions (Signed)
 Return here for persistent confusion, slurred speech, weakness one-sided your body, or any other problems.  Call your doctor to schedule a follow-up visit for later this week

## 2024-10-05 ENCOUNTER — Other Ambulatory Visit (HOSPITAL_BASED_OUTPATIENT_CLINIC_OR_DEPARTMENT_OTHER): Payer: Self-pay

## 2024-10-05 DIAGNOSIS — Z789 Other specified health status: Secondary | ICD-10-CM | POA: Diagnosis not present

## 2024-10-05 DIAGNOSIS — E7800 Pure hypercholesterolemia, unspecified: Secondary | ICD-10-CM | POA: Diagnosis not present

## 2024-10-09 ENCOUNTER — Ambulatory Visit: Admitting: Family Medicine

## 2024-10-09 ENCOUNTER — Other Ambulatory Visit (HOSPITAL_BASED_OUTPATIENT_CLINIC_OR_DEPARTMENT_OTHER): Payer: Self-pay

## 2024-10-09 ENCOUNTER — Encounter: Payer: Self-pay | Admitting: Family Medicine

## 2024-10-09 VITALS — BP 134/90 | HR 85 | Temp 97.7°F | Ht 66.0 in | Wt 204.0 lb

## 2024-10-09 DIAGNOSIS — G43909 Migraine, unspecified, not intractable, without status migrainosus: Secondary | ICD-10-CM

## 2024-10-09 DIAGNOSIS — E119 Type 2 diabetes mellitus without complications: Secondary | ICD-10-CM | POA: Diagnosis not present

## 2024-10-09 LAB — HEMOGLOBIN A1C: Hgb A1c MFr Bld: 6.4 % (ref 4.6–6.5)

## 2024-10-09 NOTE — Progress Notes (Addendum)
° °  Subjective:    Patient ID: REDONNA Swanson, female    DOB: [birth year], 48 y.o.   MRN: [id number]  HPI Here to follow up on an ED visit on 10-02-24. That day she complained of headache, chest pressure, and numbness in the left face and left arm. Her partner also noted that she was very confused and had trouble speaking. At the ED her exam was normal. Her labs and EKG were normal. A head CT and a neck CTA were normal. Neurology was consulted, and they determined she had a complex migraine. Since then she has felt fine physically, but she still has some fogginess to her thinking. She is able to drive and work. She sees Dr. Garnette Pique at [facility] for her migraines.    Review of Systems  Constitutional: Negative.   Respiratory: Negative.    Cardiovascular: Negative.   Gastrointestinal: Negative.   Genitourinary: Negative.   Neurological: Negative.   Psychiatric/Behavioral:  Positive for decreased concentration. Negative for confusion.        Objective:   Physical Exam Constitutional:      Appearance: Normal appearance.  Cardiovascular:     Rate and Rhythm: Normal rate and regular rhythm.     Pulses: Normal pulses.     Heart sounds: Normal heart sounds.  Pulmonary:     Effort: Pulmonary effort is normal.  Neurological:     Mental Status: She is alert and oriented to person, place, and time. Mental status is at baseline.           Assessment & Plan:  She seems to be having complex migraines, and I advised her to follow up with Dr. Pique asap. We will check an A1c today for her type 2 diabetes. I personally spent a total of 34 minutes in the care of the patient today including getting/reviewing separately obtained history, performing a medically appropriate exam/evaluation, independently interpreting results, and communicating results.  Garnette Olmsted, MD

## 2024-10-09 NOTE — Addendum Note (Signed)
 Addended by: JOHNNY SENIOR A on: 10/09/2024 01:46 PM   Modules accepted: Orders

## 2024-10-10 ENCOUNTER — Ambulatory Visit: Payer: Self-pay | Admitting: Family Medicine

## 2024-10-10 DIAGNOSIS — I10 Essential (primary) hypertension: Secondary | ICD-10-CM | POA: Diagnosis not present

## 2024-10-11 LAB — NMR, LIPOPROFILE
Cholesterol, Total: 149 mg/dL (ref 100–199)
HDL Particle Number: 38.7 umol/L (ref >=30.5)
HDL-C: 51 mg/dL (ref >39)
LDL Particle Number: 759 nmol/L (ref <1000)
LDL Size: 20 nm — ABNORMAL LOW (ref >20.5)
LDL-C (NIH Calc): 67 mg/dL (ref 0–99)
LP-IR Score: 80 — ABNORMAL HIGH (ref <=45)
Small LDL Particle Number: 461 nmol/L (ref <=527)
Triglycerides: 188 mg/dL — ABNORMAL HIGH (ref 0–149)

## 2024-10-11 LAB — LIPOPROTEIN A (LPA): Lipoprotein (a): 77.2 nmol/L — ABNORMAL HIGH (ref <75.0)

## 2024-10-12 ENCOUNTER — Other Ambulatory Visit (HOSPITAL_BASED_OUTPATIENT_CLINIC_OR_DEPARTMENT_OTHER): Payer: Self-pay

## 2024-10-16 ENCOUNTER — Ambulatory Visit: Payer: Self-pay | Admitting: Internal Medicine

## 2024-10-23 ENCOUNTER — Ambulatory Visit (INDEPENDENT_AMBULATORY_CARE_PROVIDER_SITE_OTHER)

## 2024-10-23 ENCOUNTER — Encounter (HOSPITAL_BASED_OUTPATIENT_CLINIC_OR_DEPARTMENT_OTHER): Payer: Self-pay

## 2024-10-23 ENCOUNTER — Encounter (HOSPITAL_BASED_OUTPATIENT_CLINIC_OR_DEPARTMENT_OTHER)

## 2024-10-23 VITALS — BP 148/87 | HR 78 | Ht 66.0 in | Wt 204.0 lb

## 2024-10-23 DIAGNOSIS — J4540 Moderate persistent asthma, uncomplicated: Secondary | ICD-10-CM

## 2024-10-23 DIAGNOSIS — G4719 Other hypersomnia: Secondary | ICD-10-CM

## 2024-10-23 NOTE — Patient Instructions (Signed)
 Follow up after sleep study completed and read; call for appt.

## 2024-10-23 NOTE — Progress Notes (Signed)
 "  @Patient  ID: Zoe Swanson, female    DOB: July 26, 1976, 48 y.o.   MRN: [id number]  Chief Complaint  Patient presents with   Follow-up    Referring provider: [name] LABOR, MD  HPI: Discussed the use of AI scribe software for clinical note transcription with the patient, who gave verbal consent to proceed.  History of Present Illness Zoe Swanson is a 48 year old female with sleep disturbances and asthma who presents for follow-up regarding her sleep issues.  She experiences ongoing sleep disturbances, characterized by difficulty both falling and staying asleep. She attempts to go to sleep by 9 or 10 PM but often wakes up around 1 or 2 AM and again at 6:30 AM. Persistent tiredness and headaches occur upon waking. Her husband has observed her 'fighting in her sleep.' She has not completed a sleep study due to financial constraints. She uses Xanax  to help with sleep initiation and occasionally takes Benadryl  at night due to lip and ear breakouts. She avoids melatonin due to concerns about nightmares. She feels 'foggy' and tired during the day, impacting her ability to engage in activities.  She has a history of asthma and is currently taking Symbicort two puffs twice a day and Singulair  once a day. She uses albuterol  as needed, reporting increased use recently due to chest tightness and pain, although it has not been particularly effective. She was evaluated in the ER on 10/02/2024 for similar symptoms but had a negative workup and was diagnosed with complex migraine.  She sometimes forgets to take her medications due to feeling 'cloudy.'  She was diagnosed with diabetes since her last appt here and is monitoring her blood glucose levels. She drinks water frequently due to dry mouth and is trying to understand dietary restrictions better. Her recent A1c was checked, and she recalls being told it showed good control. She experiences eye pain and headaches, which she finds difficult to  attribute to either her diabetes, sleep issues, or asthma.  She mentions frequent urination, particularly at night, and feels generally fatigued, which has limited her physical activity. She is a product manager.  TEST/EVENTS :  ESS:  19    Split night study completed in 2018:  full results not available but patient reports that it was negative.    Allergies[1]  Immunization History  Administered Date(s) Administered   Influenza,inj,Quad PF,6+ Mos 11/08/2013, 12/05/2014, 10/14/2018, 09/02/2019   Influenza-Unspecified 06/24/2023   Janssen (J&J) SARS-COV-2 Vaccination 01/03/2019   Meningococcal polysaccharide vaccine (MPSV4) 10/31/2010   PFIZER(Purple Top)SARS-COV-2 Vaccination 09/02/2020   PPD Test 12/21/2011   Rabies, IM 06/11/2020   Td 10/31/2010   Tdap 06/11/2020    Past Medical History:  Diagnosis Date   Allergy    Anxiety    Asthma    prn inhaler and neb.   Axillary mass 07/2014   left   Cataract    Chronic kidney disease    CRPS (complex regional pain syndrome), lower limb    left ankle   Diabetes mellitus without complication (HCC)    Gallstones    Hypertension    under better control since adding HCTZ   Migraine    Multiple allergies    Sleep apnea     Tobacco History: Tobacco Use History[2] Counseling given: Not Answered   Outpatient Medications Prior to Visit  Medication Sig Dispense Refill   albuterol  (VENTOLIN  HFA) 108 (90 Base) MCG/ACT inhaler Inhale 2 puffs into the lungs every 4 (four) hours as needed for  wheezing or shortness of breath. 6.7 g 11   ALPRAZolam  (XANAX ) 1 MG tablet TAKE 1 TABLET BY MOUTH EVERY 6 HOURS AS NEEDED FOR ANXIETY 120 tablet 5   ASPIRIN LOW DOSE 81 MG tablet Take 81 mg by mouth daily.     augmented betamethasone  dipropionate (DIPROLENE -AF) 0.05 % cream APPLY TO AFFECTED AREA(S) DAILY TOPICALLY (Patient taking differently: as needed.) 30 g 2   Continuous Glucose Sensor (FREESTYLE LIBRE 3 PLUS SENSOR) MISC Change sensor every 15  days. 6 each 3   cyclobenzaprine  (FLEXERIL ) 10 MG tablet Take 1 tablet (10 mg total) by mouth 3 (three) times daily as needed for muscle spasms. 90 tablet 3   diphenhydrAMINE  (BENADRYL ) 25 MG tablet Take 25 mg by mouth every 6 (six) hours as needed for allergies (allergic reaction).     EPINEPHrine  0.3 mg/0.3 mL IJ SOAJ injection INJECT INTO THE MIDDLE OF THE OUTER THIGH AND HOLD FOR 3 SECONDS AS NEEDED FOR SEVERE ALLERGIC REACTION THEN CALL 911 IF USED. 2 each 5   Evolocumab  (REPATHA  SURECLICK) 140 MG/ML SOAJ Inject 140 mg into the skin every 14 (fourteen) days. 6 mL 3   furosemide  (LASIX ) 20 MG tablet She takes 2 tabs in the mornings and 1 or 2 tabs in the afternoons     Magnesium Glycinate 100 MG CAPS Take 100 mg by mouth.     metoprolol  tartrate (LOPRESSOR ) 100 MG tablet Take 1 tablet (100 mg total) by mouth 2 (two) times daily. 60 tablet 6   montelukast  (SINGULAIR ) 10 MG tablet Take 1 tablet (10 mg total) by mouth at bedtime. 90 tablet 3   phentermine  37.5 MG capsule Take 1 capsule (37.5 mg total) by mouth every morning. 90 capsule 1   POTASSIUM CHLORIDE ER PO Take 10 mg by mouth daily at 12 noon. Take 2 tablets daily (Patient taking differently: Take 10 mg by mouth as needed. Take 2 tablets daily)     triamcinolone  cream (KENALOG ) 0.1 % APPLY TO AFFECTED AREA TWICE A DAY 30 g 0   valsartan (DIOVAN) 80 MG tablet Take 80 mg by mouth 2 (two) times daily.     No facility-administered medications prior to visit.     Review of Systems: as per hpi  Constitutional:   No  weight loss, night sweats,  Fevers, chills, fatigue, or  lassitude.  HEENT:   No headaches,  Difficulty swallowing,  Tooth/dental problems, or  Sore throat,                No sneezing, itching, ear ache, nasal congestion, post nasal drip,   CV:  No chest pain,  Orthopnea, PND, swelling in lower extremities, anasarca, dizziness, palpitations, syncope.   GI  No heartburn, indigestion, abdominal pain, nausea, vomiting,  diarrhea, change in bowel habits, loss of appetite, bloody stools.   Resp: No shortness of breath with exertion or at rest.  No excess mucus, no productive cough,  No non-productive cough,  No coughing up of blood.  No change in color of mucus.  No wheezing.  No chest wall deformity  Skin: no rash or lesions.  GU: no dysuria, change in color of urine, no urgency or frequency.  No flank pain, no hematuria   MS:  No joint pain or swelling.  No decreased range of motion.  No back pain.    Physical Exam  BP (!) 148/87   Pulse 78   Ht 5' 6 (1.676 m)   Wt 204 lb (92.5 kg)  LMP 09/05/2019   SpO2 100%   BMI 32.93 kg/m   GEN: A/Ox3; pleasant , well nourished, appears tired but answers questions appropriately   HEENT:  [place]/AT,  EACs-clear, TMs-wnl, NOSE-clear, THROAT-clear, no lesions, no postnasal drip or exudate noted.   NECK:  Supple w/ fair ROM; no JVD; normal carotid impulses w/o bruits; no thyromegaly or nodules palpated; no lymphadenopathy.    RESP  Clear  P & A; w/o, wheezes/ rales/ or rhonchi. no accessory muscle use, no dullness to percussion  CARD:  RRR, no m/r/g, no peripheral edema, pulses intact, no cyanosis or clubbing.  GI:   Soft & nt; nml bowel sounds; no organomegaly or masses detected.   Musco: Warm bil, no deformities or joint swelling noted.   Neuro: alert, no focal deficits noted.    Skin: Warm, no lesions or rashes    Lab Results:  CBC    Component Value Date/Time   WBC 7.3 10/02/2024 1152   RBC 4.80 10/02/2024 1152   HGB 14.3 10/02/2024 1242   HCT 42.0 10/02/2024 1242   PLT 349 10/02/2024 1152   MCV 86.3 10/02/2024 1152   MCH 29.0 10/02/2024 1152   MCHC 33.6 10/02/2024 1152   RDW 14.6 10/02/2024 1152   LYMPHSABS 2.4 10/02/2024 1158   MONOABS 0.5 10/02/2024 1158   EOSABS 0.2 10/02/2024 1158   BASOSABS 0.1 10/02/2024 1158    BMET    Component Value Date/Time   NA 138 10/02/2024 1242   K 4.2 10/02/2024 1242   CL 104 10/02/2024 1152    CO2 25 10/02/2024 1152   GLUCOSE 99 10/02/2024 1152   BUN 11 10/02/2024 1152   CREATININE 0.90 10/02/2024 1152   CALCIUM 9.4 10/02/2024 1152   GFRNONAA >60 10/02/2024 1152   GFRAA >60 02/02/2018 1708    BNP    Component Value Date/Time   BNP 13.3 10/21/2017 1623    ProBNP No results found for: PROBNP  Imaging:   Administration History     None           No data to display          No results found for: NITRICOXIDE   Assessment & Plan:   Assessment & Plan Excessive daytime sleepiness  Moderate persistent asthma without complication  Assessment and Plan Assessment & Plan Obstructive sleep apnea Persistent symptoms of headaches and sleepiness despite sleep. Sleep study delayed due to financial constraints. Sleep study essential for diagnosis and treatment. - Ensure completion of the sleep study as soon as financially feasible. - WatchPAT provided at today's appt - Plan follow-up after the sleep study is completed.  Moderate persistent asthma Asthma managed with Symbicort and Singulair . Lungs sound good, indicating some treatment effectiveness. Difficulty remembering Symbicort. - Continue Symbicort two puffs twice daily.  Compliance encouraged. - Continue Singulair  once daily. - Use albuterol  as needed for chest tightness. - Encouraged adherence to medication regimen.    Return if symptoms worsen or fail to improve, for sleep study review.  Candis Dandy, PA-C 10/23/2024      [1]  Allergies Allergen Reactions   Atorvastatin Other (See Comments)    Possibly headache or a rash; patient unsure   Escitalopram  Other (See Comments)   Hydrocodone  Other (See Comments)   Mold Extract [Trichophyton] Hives and Itching   Pollen Extract Hives and Itching   Lisinopril  Cough   Oxycodone -Acetaminophen  Itching   Percocet [Oxycodone -Acetaminophen ] Itching    itching  [2]  Social History Tobacco Use  Smoking Status Never  Smokeless Tobacco Never   "

## 2024-10-25 DIAGNOSIS — G4733 Obstructive sleep apnea (adult) (pediatric): Secondary | ICD-10-CM | POA: Diagnosis not present

## 2024-11-06 ENCOUNTER — Ambulatory Visit: Attending: Cardiology | Admitting: Cardiovascular Disease

## 2024-11-06 ENCOUNTER — Encounter: Payer: Self-pay | Admitting: Cardiovascular Disease

## 2024-11-06 VITALS — BP 130/90 | HR 70 | Ht 66.0 in | Wt 203.0 lb

## 2024-11-06 DIAGNOSIS — R002 Palpitations: Secondary | ICD-10-CM | POA: Diagnosis not present

## 2024-11-06 DIAGNOSIS — I471 Supraventricular tachycardia, unspecified: Secondary | ICD-10-CM | POA: Diagnosis not present

## 2024-11-06 NOTE — Progress Notes (Signed)
" °  Electrophysiology Office Note:    Date:  11/06/2024   ID:  Zoe Swanson, DOB 1976-06-10, MRN [id number]  PCP:  [name] LABOR, MD   [facility] Providers Cardiologist:  None     Referring MD: Michele Richardson, DO   History of Present Illness:    Zoe Swanson is a 49 y.o. female with a medical history significant for SVT, syncopal events with a loop monitor in place generalized anxiety disorder, HTN, CKD, SVT, HLD, DM2 , referred for arrhythmia management.     Discussed the use of AI scribe software for clinical note transcription with the patient, who gave verbal consent to proceed.  History of Present Illness Zoe Swanson is a 49 year old female who presents for evaluation of her loop recorder.  She has long-standing tachycardia since college, worsened after having twins, with episodes that sometimes lead to syncope. She takes metoprolol  and losartan with improved symptom control, noting fewer high heart rate alerts on her watch. She is also followed by a neurologist.  She has migraines treated with Excedrin and reports facial tingling and numbness when smiling.  She associates stress with worsening tachycardia. She had a kidney biopsy that showed narrowing of the renal arteries, though dialysis is not currently needed.         Today, she reports she feels well and has no acute complaints  EKGs/Labs/Other Studies Reviewed Today:     Echocardiogram:  TTE July 2018 LVEF 60 to 65%.  Grade 1 diastolic dysfunction.    EKG:   EKG Interpretation Date/Time:  Monday November 06 2024 10:40:43 EST Ventricular Rate:  72 PR Interval:  154 QRS Duration:  92 QT Interval:  414 QTC Calculation: 453 R Axis:   59  Text Interpretation: Normal sinus rhythm Normal ECG When compared with ECG of 02-Oct-2024 17:33, No significant change was found Confirmed by Nancey Scotts [phone]) on 11/06/2024 10:51:24 AM     Physical Exam:    VS:  BP (!) 130/90 (BP Location:  Left Arm, Patient Position: Sitting, Cuff Size: Large)   Pulse 70   Ht 5' 6 (1.676 m)   Wt 203 lb (92.1 kg)   LMP 09/05/2019   SpO2 96%   BMI 32.77 kg/m     Wt Readings from Last 3 Encounters:  11/06/24 203 lb (92.1 kg)  10/23/24 204 lb (92.5 kg)  10/09/24 204 lb (92.5 kg)     GEN:  Well nourished, well developed in no acute distress CARDIAC: RRR, no murmurs, rubs, gallops RESPIRATORY:  Normal work of breathing MUSCULOSKELETAL: no edema    ASSESSMENT & PLAN:     Palpitations Often occurs in stressful situations I suspect there may be a component of sinus tachycardia Will continue to monitor with loop recorder Continue metoprolol  100 mg p.o. twice daily  Syncope No events recently.  Continue to monitor loop recorder  We will continue to follow the patient's loop recorder and see her as needed in the electrophysiology clinic.  When the battery reaches ERI, I would advise removing the loop recorder since it was implanted into the breast.  Signed, Scotts FORBES Nancey, MD  11/06/2024 11:05 AM    [facility] "

## 2024-11-06 NOTE — Patient Instructions (Signed)
 Medication Instructions:  Your physician recommends that you continue on your current medications as directed. Please refer to the Current Medication list given to you today.  *If you need a refill on your cardiac medications before your next appointment, please call your pharmacy*  Lab Work: None ordered.  If you have labs (blood work) drawn today and your tests are completely normal, you will receive your results only by: MyChart Message (if you have MyChart) OR A paper copy in the mail If you have any lab test that is abnormal or we need to change your treatment, we will call you to review the results.  Testing/Procedures: None ordered.   Follow-Up: At [facility], you and your health needs are our priority.  As part of our continuing mission to provide you with exceptional heart care, our providers are all part of one team.  This team includes your primary Cardiologist (physician) and Advanced Practice Providers or APPs (Physician Assistants and Nurse Practitioners) who all work together to provide you with the care you need, when you need it.  Your next appointment:   Follow up with Dr Mealor as needed

## 2024-11-08 ENCOUNTER — Encounter (HOSPITAL_BASED_OUTPATIENT_CLINIC_OR_DEPARTMENT_OTHER): Payer: Self-pay

## 2024-11-08 ENCOUNTER — Other Ambulatory Visit (HOSPITAL_BASED_OUTPATIENT_CLINIC_OR_DEPARTMENT_OTHER): Payer: Self-pay

## 2024-11-09 ENCOUNTER — Other Ambulatory Visit (HOSPITAL_BASED_OUTPATIENT_CLINIC_OR_DEPARTMENT_OTHER): Payer: Self-pay

## 2024-11-14 ENCOUNTER — Other Ambulatory Visit (HOSPITAL_BASED_OUTPATIENT_CLINIC_OR_DEPARTMENT_OTHER): Payer: Self-pay

## 2024-11-14 ENCOUNTER — Telehealth (HOSPITAL_BASED_OUTPATIENT_CLINIC_OR_DEPARTMENT_OTHER): Payer: Self-pay | Admitting: Licensed Clinical Social Worker

## 2024-11-14 NOTE — Telephone Encounter (Signed)
 H&V Care Navigation CSW Progress Note  Clinical Social Worker contacted patient by phone to f/u on call without voicemail today. Returned pt call without answer. Left a voicemail at [phone] encouraging her to call back if any questions and to leave a voicemail if I do not answer call back as I may be assisting another pt. Remain available as needed. Note pt had been told metoprolol  cash price ($5), still did not pick up at all from [facility] pharmacy.  Patient is participating in a Managed Medicaid Plan:  No, BCBS  SDOH Screenings   Food Insecurity: Food Insecurity Present (08/30/2024)  Housing: Low Risk (08/30/2024)  Transportation Needs: No Transportation Needs (08/30/2024)  Utilities: Patient Declined (08/30/2024)  Depression (PHQ2-9): High Risk (05/17/2024)  Financial Resource Strain: Medium Risk (08/30/2024)  Social Connections: Unknown (03/06/2022)   Received from [facility]  Tobacco Use: Low Risk (11/06/2024)  Health Literacy: Adequate Health Literacy (08/30/2024)    Marit Lark, MSW, LCSW Clinical Social Worker [facility] Care Navigation  [phone]- work cell phone (preferred)

## 2024-11-16 ENCOUNTER — Encounter (HOSPITAL_COMMUNITY): Payer: Self-pay

## 2024-11-16 ENCOUNTER — Emergency Department (HOSPITAL_COMMUNITY)

## 2024-11-16 ENCOUNTER — Other Ambulatory Visit: Payer: Self-pay

## 2024-11-16 ENCOUNTER — Emergency Department (HOSPITAL_COMMUNITY)
Admission: EM | Admit: 2024-11-16 | Discharge: 2024-11-16 | Disposition: A | Discharge status: Home / Self Care | Attending: Emergency Medicine | Admitting: Emergency Medicine

## 2024-11-16 DIAGNOSIS — Z79899 Other long term (current) drug therapy: Secondary | ICD-10-CM | POA: Insufficient documentation

## 2024-11-16 DIAGNOSIS — S52612A Displaced fracture of left ulna styloid process, initial encounter for closed fracture: Secondary | ICD-10-CM | POA: Diagnosis not present

## 2024-11-16 DIAGNOSIS — Z7982 Long term (current) use of aspirin: Secondary | ICD-10-CM | POA: Insufficient documentation

## 2024-11-16 DIAGNOSIS — I10 Essential (primary) hypertension: Secondary | ICD-10-CM | POA: Diagnosis not present

## 2024-11-16 DIAGNOSIS — Y92410 Unspecified street and highway as the place of occurrence of the external cause: Secondary | ICD-10-CM | POA: Insufficient documentation

## 2024-11-16 DIAGNOSIS — S6992XA Unspecified injury of left wrist, hand and finger(s), initial encounter: Secondary | ICD-10-CM | POA: Diagnosis present

## 2024-11-16 DIAGNOSIS — T148XXA Other injury of unspecified body region, initial encounter: Secondary | ICD-10-CM

## 2024-11-16 MED ORDER — TRAMADOL HCL 50 MG PO TABS
50.0000 mg | ORAL_TABLET | Freq: Once | ORAL | Status: AC
  Administered 2024-11-16: 50 mg via ORAL
  Filled 2024-11-16: qty 1

## 2024-11-16 MED ORDER — CYCLOBENZAPRINE HCL 10 MG PO TABS: 10.0000 mg | ORAL_TABLET | Freq: Two times a day (BID) | ORAL | 0 refills | Status: DC | PRN

## 2024-11-16 MED ORDER — ACETAMINOPHEN 500 MG PO TABS
1000.0000 mg | ORAL_TABLET | Freq: Once | ORAL | Status: AC
  Administered 2024-11-16: 1000 mg via ORAL
  Filled 2024-11-16: qty 2

## 2024-11-16 MED ORDER — CYCLOBENZAPRINE HCL 10 MG PO TABS
10.0000 mg | ORAL_TABLET | Freq: Once | ORAL | Status: AC
  Administered 2024-11-16: 10 mg via ORAL
  Filled 2024-11-16: qty 1

## 2024-11-16 MED ORDER — LIDOCAINE 5 % EX PTCH
2.0000 | MEDICATED_PATCH | Freq: Once | CUTANEOUS | Status: DC
  Administered 2024-11-16: 2 via TRANSDERMAL
  Filled 2024-11-16: qty 2
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
## 2024-11-16 NOTE — Progress Notes (Signed)
 Orthopedic Tech Progress Note Patient Details:  Zoe Swanson 05-Apr-1976 [id number]  Ortho Devices Type of Ortho Device: Wrist splint Ortho Device/Splint Location: LLE Ortho Device/Splint Interventions: Ordered, Application, Adjustment   Post Interventions Patient Tolerated: Fair Instructions Provided: Adjustment of device, Care of device  Enna Warwick F Myia Bergh 11/16/2024, 7:16 PM

## 2024-11-16 NOTE — Progress Notes (Signed)
 Orthopedic Tech Progress Note Patient Details:  Zoe Swanson 1976/07/12 [id number]  Ortho Devices Type of Ortho Device: Wrist splint Ortho Device/Splint Location: Left Hand Ortho Device/Splint Interventions: Ordered, Application, Adjustment   Post Interventions Patient Tolerated: Fair Instructions Provided: Adjustment of device  Zoe Swanson [name] 11/16/2024, 7:24 PM
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
## 2024-11-16 NOTE — ED Notes (Signed)
Ortho tech called to apply wrist brace.
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
## 2024-11-17 ENCOUNTER — Ambulatory Visit: Payer: Self-pay
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
## 2024-11-23 NOTE — Progress Notes (Signed)
 DIAGNOSTIC NEUROLOGY  ELECTROENCEPHALOGRAPHY  HISTORY: 49 year old female who underwent EEG for evaluation of transient neurologic symptoms.  PERTINENT MEDICATIONS: Reviewed   TOTAL RECORDING TIME: 21 minutes, 0 seconds START TIME: 11/23/2024 @ 14:19 END TIME: 11/23/2024 @ 14:40  STATE: Awake and drowsy  PROCEDURE: A routine EEG with 21 recording channels and a single channel devoted to limited EKG recording was performed. Electrodes were placed using the standard International 10-20 System.  Upon maximal arousal, the background consists of a 10 Hz frequency, 25 uV amplitude rhythm which is symmetric over the posterior derivations and attenuates appropriately with eye opening. There is no change in the background rhythm with photic stimulation or hyperventilation. Although the patient does become drowsy, no definitive sleep architecture is observed.  Reported symptoms of vertigo, eyelid twitching, bad headache, heart beating fast without EEG or EKG abnormality.  No focal abnormalities, asymmetries or epileptiform discharges are observed during this recording.   INTERPRETATION: Normal awake and drowsy EEG.  CLINICAL CORRELATION: A normal EEG does not rule out the possibility of underlying seizures or seizure disorder.   Arvind M. Gomadam, MD Board Certified, ABPN Clinical Neurophysiology

## 2024-11-24 ENCOUNTER — Other Ambulatory Visit: Payer: Self-pay

## 2024-11-24 ENCOUNTER — Encounter (HOSPITAL_COMMUNITY): Payer: Self-pay

## 2024-11-24 ENCOUNTER — Encounter: Payer: Self-pay | Admitting: Family Medicine

## 2024-11-24 ENCOUNTER — Emergency Department (HOSPITAL_COMMUNITY)

## 2024-11-24 ENCOUNTER — Ambulatory Visit: Admitting: Family Medicine

## 2024-11-24 ENCOUNTER — Other Ambulatory Visit (HOSPITAL_BASED_OUTPATIENT_CLINIC_OR_DEPARTMENT_OTHER): Payer: Self-pay

## 2024-11-24 ENCOUNTER — Inpatient Hospital Stay (HOSPITAL_COMMUNITY)
Admission: EM | Admit: 2024-11-24 | Discharge: 2024-11-27 | DRG: 062 | Disposition: A | Source: Home / Self Care | Attending: Neurology | Admitting: Neurology

## 2024-11-24 VITALS — BP 126/80 | HR 55 | Temp 98.3°F | Wt 207.0 lb

## 2024-11-24 DIAGNOSIS — G43909 Migraine, unspecified, not intractable, without status migrainosus: Secondary | ICD-10-CM | POA: Diagnosis present

## 2024-11-24 DIAGNOSIS — F449 Dissociative and conversion disorder, unspecified: Secondary | ICD-10-CM

## 2024-11-24 DIAGNOSIS — R29703 NIHSS score 3: Secondary | ICD-10-CM | POA: Diagnosis present

## 2024-11-24 DIAGNOSIS — R253 Fasciculation: Secondary | ICD-10-CM | POA: Diagnosis present

## 2024-11-24 DIAGNOSIS — I129 Hypertensive chronic kidney disease with stage 1 through stage 4 chronic kidney disease, or unspecified chronic kidney disease: Secondary | ICD-10-CM | POA: Diagnosis present

## 2024-11-24 DIAGNOSIS — I639 Cerebral infarction, unspecified: Secondary | ICD-10-CM | POA: Diagnosis not present

## 2024-11-24 DIAGNOSIS — R55 Syncope and collapse: Secondary | ICD-10-CM | POA: Diagnosis present

## 2024-11-24 DIAGNOSIS — Z885 Allergy status to narcotic agent status: Secondary | ICD-10-CM

## 2024-11-24 DIAGNOSIS — S52612D Displaced fracture of left ulna styloid process, subsequent encounter for closed fracture with routine healing: Secondary | ICD-10-CM

## 2024-11-24 DIAGNOSIS — G4730 Sleep apnea, unspecified: Secondary | ICD-10-CM | POA: Diagnosis present

## 2024-11-24 DIAGNOSIS — R569 Unspecified convulsions: Secondary | ICD-10-CM

## 2024-11-24 DIAGNOSIS — F411 Generalized anxiety disorder: Secondary | ICD-10-CM | POA: Diagnosis present

## 2024-11-24 DIAGNOSIS — Z888 Allergy status to other drugs, medicaments and biological substances status: Secondary | ICD-10-CM

## 2024-11-24 DIAGNOSIS — E1165 Type 2 diabetes mellitus with hyperglycemia: Secondary | ICD-10-CM | POA: Diagnosis present

## 2024-11-24 DIAGNOSIS — Z9682 Presence of neurostimulator: Secondary | ICD-10-CM

## 2024-11-24 DIAGNOSIS — Z7982 Long term (current) use of aspirin: Secondary | ICD-10-CM

## 2024-11-24 DIAGNOSIS — N183 Chronic kidney disease, stage 3 unspecified: Secondary | ICD-10-CM | POA: Diagnosis present

## 2024-11-24 DIAGNOSIS — Z6833 Body mass index (BMI) 33.0-33.9, adult: Secondary | ICD-10-CM

## 2024-11-24 DIAGNOSIS — Z833 Family history of diabetes mellitus: Secondary | ICD-10-CM

## 2024-11-24 DIAGNOSIS — E785 Hyperlipidemia, unspecified: Secondary | ICD-10-CM | POA: Diagnosis present

## 2024-11-24 DIAGNOSIS — S62102D Fracture of unspecified carpal bone, left wrist, subsequent encounter for fracture with routine healing: Secondary | ICD-10-CM

## 2024-11-24 DIAGNOSIS — H532 Diplopia: Secondary | ICD-10-CM | POA: Diagnosis present

## 2024-11-24 DIAGNOSIS — E669 Obesity, unspecified: Secondary | ICD-10-CM | POA: Diagnosis present

## 2024-11-24 DIAGNOSIS — M79605 Pain in left leg: Secondary | ICD-10-CM | POA: Diagnosis present

## 2024-11-24 DIAGNOSIS — R2990 Unspecified symptoms and signs involving the nervous system: Secondary | ICD-10-CM

## 2024-11-24 DIAGNOSIS — G8194 Hemiplegia, unspecified affecting left nondominant side: Secondary | ICD-10-CM | POA: Diagnosis present

## 2024-11-24 DIAGNOSIS — I6389 Other cerebral infarction: Principal | ICD-10-CM | POA: Diagnosis present

## 2024-11-24 DIAGNOSIS — S52612S Displaced fracture of left ulna styloid process, sequela: Secondary | ICD-10-CM | POA: Insufficient documentation

## 2024-11-24 DIAGNOSIS — Z79899 Other long term (current) drug therapy: Secondary | ICD-10-CM

## 2024-11-24 DIAGNOSIS — E1122 Type 2 diabetes mellitus with diabetic chronic kidney disease: Secondary | ICD-10-CM | POA: Diagnosis present

## 2024-11-24 DIAGNOSIS — Z8249 Family history of ischemic heart disease and other diseases of the circulatory system: Secondary | ICD-10-CM

## 2024-11-24 DIAGNOSIS — R531 Weakness: Principal | ICD-10-CM

## 2024-11-24 DIAGNOSIS — Z83719 Family history of colon polyps, unspecified: Secondary | ICD-10-CM

## 2024-11-24 DIAGNOSIS — Z90710 Acquired absence of both cervix and uterus: Secondary | ICD-10-CM

## 2024-11-24 DIAGNOSIS — R29810 Facial weakness: Secondary | ICD-10-CM | POA: Diagnosis present

## 2024-11-24 DIAGNOSIS — J45909 Unspecified asthma, uncomplicated: Secondary | ICD-10-CM | POA: Diagnosis present

## 2024-11-24 DIAGNOSIS — Z800 Family history of malignant neoplasm of digestive organs: Secondary | ICD-10-CM

## 2024-11-24 DIAGNOSIS — F32A Depression, unspecified: Secondary | ICD-10-CM | POA: Diagnosis present

## 2024-11-24 LAB — COMPREHENSIVE METABOLIC PANEL WITH GFR
ALT: 31 U/L (ref 0–44)
AST: 27 U/L (ref 15–41)
Albumin: 4.7 g/dL (ref 3.5–5.0)
Alkaline Phosphatase: 127 U/L — ABNORMAL HIGH (ref 38–126)
Anion gap: 11 (ref 5–15)
BUN: 12 mg/dL (ref 6–20)
CO2: 25 mmol/L (ref 22–32)
Calcium: 9.4 mg/dL (ref 8.9–10.3)
Chloride: 100 mmol/L (ref 98–111)
Creatinine, Ser: 0.93 mg/dL (ref 0.44–1.00)
GFR, Estimated: >60 mL/min
Glucose, Bld: 108 mg/dL — ABNORMAL HIGH (ref 70–99)
Potassium: 4.1 mmol/L (ref 3.5–5.1)
Sodium: 135 mmol/L (ref 135–145)
Total Bilirubin: 0.2 mg/dL (ref 0.0–1.2)
Total Protein: 8.4 g/dL — ABNORMAL HIGH (ref 6.5–8.1)

## 2024-11-24 LAB — CBC
HCT: 45.6 % (ref 36.0–46.0)
Hemoglobin: 15.1 g/dL — ABNORMAL HIGH (ref 12.0–15.0)
MCH: 29 pg (ref 26.0–34.0)
MCHC: 33.1 g/dL (ref 30.0–36.0)
MCV: 87.7 fL (ref 80.0–100.0)
Platelets: 326 10*3/uL (ref 150–400)
RBC: 5.2 MIL/uL — ABNORMAL HIGH (ref 3.87–5.11)
RDW: 13.9 % (ref 11.5–15.5)
WBC: 8.1 10*3/uL (ref 4.0–10.5)
nRBC: 0 % (ref 0.0–0.2)

## 2024-11-24 LAB — I-STAT CHEM 8, ED
BUN: 14 mg/dL (ref 6–20)
Calcium, Ion: 1.18 mmol/L (ref 1.15–1.40)
Chloride: 99 mmol/L (ref 98–111)
Creatinine, Ser: 1 mg/dL (ref 0.44–1.00)
Glucose, Bld: 107 mg/dL — ABNORMAL HIGH (ref 70–99)
HCT: 47 % — ABNORMAL HIGH (ref 36.0–46.0)
Hemoglobin: 16 g/dL — ABNORMAL HIGH (ref 12.0–15.0)
Potassium: 4.1 mmol/L (ref 3.5–5.1)
Sodium: 138 mmol/L (ref 135–145)
TCO2: 24 mmol/L (ref 22–32)

## 2024-11-24 LAB — DIFFERENTIAL
Abs Immature Granulocytes: 0.04 10*3/uL (ref 0.00–0.07)
Basophils Absolute: 0.1 10*3/uL (ref 0.0–0.1)
Basophils Relative: 1 %
Eosinophils Absolute: 0.2 10*3/uL (ref 0.0–0.5)
Eosinophils Relative: 2 %
Immature Granulocytes: 1 %
Lymphocytes Relative: 34 %
Lymphs Abs: 2.8 10*3/uL (ref 0.7–4.0)
Monocytes Absolute: 0.5 10*3/uL (ref 0.1–1.0)
Monocytes Relative: 6 %
Neutro Abs: 4.5 10*3/uL (ref 1.7–7.7)
Neutrophils Relative %: 56 %

## 2024-11-24 LAB — PROTIME-INR
INR: 1 (ref 0.8–1.2)
Prothrombin Time: 13.4 s (ref 11.4–15.2)

## 2024-11-24 LAB — ETHANOL: Alcohol, Ethyl (B): <15 mg/dL

## 2024-11-24 LAB — APTT: aPTT: 24 s (ref 24–36)

## 2024-11-24 LAB — CBG MONITORING, ED: Glucose-Capillary: 71 mg/dL (ref 70–99)

## 2024-11-24 LAB — GLUCOSE, CAPILLARY: Glucose-Capillary: 75 mg/dL (ref 70–99)

## 2024-11-24 MED ORDER — CLEVIDIPINE BUTYRATE 0.5 MG/ML IV EMUL
INTRAVENOUS | Status: AC
  Administered 2024-11-24: 2 mg via INTRAVENOUS
  Filled 2024-11-24: qty 100

## 2024-11-24 MED ORDER — CLEVIDIPINE BUTYRATE 0.5 MG/ML IV EMUL: 0.0000 mg/h | INTRAVENOUS | Status: DC

## 2024-11-24 MED ORDER — PANTOPRAZOLE SODIUM 40 MG IV SOLR
40.0000 mg | Freq: Every day | INTRAVENOUS | Status: DC
  Administered 2024-11-24: 40 mg via INTRAVENOUS
  Filled 2024-11-24: qty 10

## 2024-11-24 MED ORDER — SODIUM CHLORIDE 0.9 % IV SOLN: INTRAVENOUS | Status: DC

## 2024-11-24 MED ORDER — CHLORHEXIDINE GLUCONATE CLOTH 2 % EX PADS
6.0000 | MEDICATED_PAD | Freq: Every day | CUTANEOUS | Status: DC
  Administered 2024-11-25 – 2024-11-26 (×2): 6 via TOPICAL

## 2024-11-24 MED ORDER — TORSEMIDE 20 MG PO TABS
20.0000 mg | ORAL_TABLET | Freq: Two times a day (BID) | ORAL | 3 refills | Status: DC
  Filled 2024-11-24: qty 60, 30d supply, fill #0

## 2024-11-24 MED ORDER — ORAL CARE MOUTH RINSE: 15.0000 mL | OROMUCOSAL | Status: DC | PRN

## 2024-11-24 MED ORDER — STROKE: EARLY STAGES OF RECOVERY BOOK
Freq: Once | Status: AC
  Filled 2024-11-24: qty 1

## 2024-11-24 MED ORDER — IOHEXOL 350 MG/ML SOLN
75.0000 mL | Freq: Once | INTRAVENOUS | Status: AC | PRN
  Administered 2024-11-24: 75 mL via INTRAVENOUS

## 2024-11-24 MED ORDER — TENECTEPLASE 25 MG IV KIT
0.2500 mg/kg | PACK | Freq: Once | INTRAVENOUS | Status: AC
  Administered 2024-11-24: 24 mg via INTRAVENOUS
  Filled 2024-11-24: qty 5

## 2024-11-24 MED ORDER — SODIUM CHLORIDE 0.9% FLUSH: 3.0000 mL | Freq: Once | INTRAVENOUS | Status: DC

## 2024-11-24 MED ORDER — TRAMADOL HCL 50 MG PO TABS
50.0000 mg | ORAL_TABLET | Freq: Four times a day (QID) | ORAL | Status: DC | PRN
  Administered 2024-11-24 – 2024-11-27 (×3): 50 mg via ORAL
  Filled 2024-11-24 (×4): qty 1

## 2024-11-24 MED ORDER — SENNOSIDES-DOCUSATE SODIUM 8.6-50 MG PO TABS: 1.0000 | ORAL_TABLET | Freq: Every evening | ORAL | Status: DC | PRN

## 2024-11-24 NOTE — ED Provider Notes (Signed)
 " [facility] Provider Note   CSN: [id number] Arrival date & time: 11/24/24  8363     Patient presents with: No chief complaint on file.   Zoe Swanson is a 49 y.o. female.   HPI 49 year old female presents as a code stroke.   Husband provides some history as well as the patient.  She was normal at the PCP visit today at 2:30 PM.  When walking outside she was dragging her coat in her left hand.  She has chronic left-sided leg and arm issues but this was worse than typical.  She then passed out as well.  EMS reported left-sided weakness and she had some transient double vision that resolved on arrival here and then came back.  Some headache.  Prior to Admission medications  Medication Sig Start Date End Date Taking? Authorizing Provider  albuterol  (VENTOLIN  HFA) 108 (90 Base) MCG/ACT inhaler Inhale 2 puffs into the lungs every 4 (four) hours as needed for wheezing or shortness of breath. 08/16/24   Charley Conger, PA-C  ALPRAZolam  (XANAX ) 1 MG tablet TAKE 1 TABLET BY MOUTH EVERY 6 HOURS AS NEEDED FOR ANXIETY 08/21/24   [name] LABOR, MD  ASPIRIN  LOW DOSE 81 MG tablet Take 81 mg by mouth daily. 08/02/24   [provider]  augmented betamethasone  dipropionate (DIPROLENE -AF) 0.05 % cream APPLY TO AFFECTED AREA(S) DAILY TOPICALLY Patient taking differently: as needed. 06/03/20   Livingston Rigg, MD  Continuous Glucose Sensor (FREESTYLE LIBRE 3 PLUS SENSOR) MISC Change sensor every 15 days. 07/31/24   [name] LABOR, MD  cyclobenzaprine  (FLEXERIL ) 10 MG tablet Take 1 tablet (10 mg total) by mouth 3 (three) times daily as needed for muscle spasms. 05/17/24   [name] LABOR, MD  diphenhydrAMINE  (BENADRYL ) 25 MG tablet Take 25 mg by mouth every 6 (six) hours as needed for allergies (allergic reaction).    [provider]  EPINEPHrine  0.3 mg/0.3 mL IJ SOAJ injection INJECT INTO THE MIDDLE OF THE OUTER THIGH AND HOLD FOR 3 SECONDS AS NEEDED FOR  SEVERE ALLERGIC REACTION THEN CALL 911 IF USED. 08/16/24   Charley Conger, PA-C  Evolocumab  (REPATHA  SURECLICK) 140 MG/ML SOAJ Inject 140 mg into the skin every 14 (fourteen) days. 08/21/24   Mona Vinie JAYSON, MD  Magnesium Glycinate 100 MG CAPS Take 100 mg by mouth. 03/03/24   [provider]  metoprolol  tartrate (LOPRESSOR ) 100 MG tablet Take 1 tablet (100 mg total) by mouth 2 (two) times daily. 08/29/24   Tolia, Sunit, DO  montelukast  (SINGULAIR ) 10 MG tablet Take 1 tablet (10 mg total) by mouth at bedtime. 08/16/24   Charley Conger, PA-C  phentermine  37.5 MG capsule Take 1 capsule (37.5 mg total) by mouth every morning. 07/13/24   [name] LABOR, MD  POTASSIUM CHLORIDE ER PO Take 10 mg by mouth daily at 12 noon. Take 2 tablets daily Patient taking differently: Take 10 mg by mouth as needed. Take 2 tablets daily    [provider]  torsemide  (DEMADEX ) 20 MG tablet Take 1 tablet (20 mg total) by mouth 2 (two) times daily. 11/24/24   [name] LABOR, MD  triamcinolone  cream (KENALOG ) 0.1 % APPLY TO AFFECTED AREA TWICE A DAY 07/10/22   [name] LABOR, MD  valsartan (DIOVAN) 80 MG tablet Take 80 mg by mouth 2 (two) times daily. 06/17/24   [provider]    Allergies: Atorvastatin, Escitalopram , Hydrocodone , Mold extract [trichophyton], Pollen extract, Oxycodone -acetaminophen , and Percocet [  oxycodone -acetaminophen ]    Review of Systems  Unable to perform ROS: Acuity of condition    Updated Vital Signs BP (!) 164/99   Pulse 89   Resp 17   Wt 94.8 kg   LMP 09/05/2019   SpO2 100%   BMI 34.78 kg/m   Physical Exam Vitals and nursing note reviewed.  Constitutional:      Appearance: She is well-developed.  HENT:     Head: Normocephalic and atraumatic.  Eyes:     Extraocular Movements: Extraocular movements intact.  Cardiovascular:     Rate and Rhythm: Normal rate.  Pulmonary:     Effort: Pulmonary effort is normal.  Abdominal:     General: There is no distension.      Palpations: Abdomen is soft.  Musculoskeletal:     Comments: Left wrist is in a Velcro wrist splint  Skin:    General: Skin is warm and dry.  Neurological:     Mental Status: She is alert.     Comments: Patient has some mild facial twitching around her left eye.  I do not appreciate any facial droop though there was facial droop at some point during neurology's exam. 5/5 strength in right upper and right lower extremities.  There is some mild drift in the left upper extremity and 3/5 strength in the left lower extremity.     (all labs ordered are listed, but only abnormal results are displayed) Labs Reviewed  I-STAT CHEM 8, ED - Abnormal; Notable for the following components:      Result Value   Glucose, Bld 107 (*)    Hemoglobin 16.0 (*)    HCT 47.0 (*)    All other components within normal limits  PROTIME-INR  APTT  CBC  DIFFERENTIAL  COMPREHENSIVE METABOLIC PANEL WITH GFR  ETHANOL  CBG MONITORING, ED    EKG: None  Radiology: CT ANGIO HEAD NECK W WO CM (CODE STROKE) Result Date: 11/24/2024 EXAM: CTA HEAD AND NECK WITH AND WITHOUT 11/24/2024 04:57:54 PM TECHNIQUE: CTA of the head and neck was performed without and with the administration of 75 mL iohexol  (OMNIPAQUE ) 350 MG/ML injection. Multiplanar 2D and/or 3D reformatted images are provided for review. Automated exposure control, iterative reconstruction, and/or weight based adjustment of the mA/kV was utilized to reduce the radiation dose to as low as reasonably achievable. Stenosis of the internal carotid arteries measured using NASCET criteria. COMPARISON: Same day CT head and CTA head and neck 10/02/2024. CLINICAL HISTORY: Neuro deficit, acute, stroke suspected. Acute neurologic deficit; stroke suspected. FINDINGS: CTA NECK: AORTIC ARCH AND ARCH VESSELS: Common origin of the brachiocephalic and left common carotid arteries. Streak artifact from dense venous contrast limits evaluation of the right subclavian artery. Visualized  portions of the left subclavian artery are patent. No dissection or arterial injury. No significant stenosis of the brachiocephalic or subclavian arteries, acknowledging the limitation for the right subclavian artery. CERVICAL CAROTID ARTERIES: Similar minimal atherosclerosis of the carotid arteries in the neck without hemodynamically significant stenosis. No dissection or arterial injury. CERVICAL VERTEBRAL ARTERIES: Streak artifact also limits evaluation of the V1 and proximal V2 segments of the vertebral arteries. Visualized portions of the vertebral arteries are patent to the vertebrobasilar confluence. No dissection or arterial injury. LUNGS AND MEDIASTINUM: Unremarkable. SOFT TISSUES: No acute abnormality. BONES: Reversal of the normal cervical lordosis. There are degenerative changes at multiple levels in the visualized cervical spine with disc space narrowing particularly at C4-C5, C5-C6, and C6-C7. CTA HEAD: ANTERIOR CIRCULATION: 2 mm  inferior and slightly medially directed outpouching along the right supraclinoid ICA at the origin of the right posterior communicating artery compatible with an infundibulum. No significant stenosis of the internal carotid arteries. No significant stenosis of the anterior cerebral arteries. No significant stenosis of the middle cerebral arteries. No aneurysm. POSTERIOR CIRCULATION: There is mild irregularity of the proximal basilar artery without significant stenosis. There is distal tapering of the basilar artery past the origins of the superior cerebellar arteries. The posterior cerebral arteries are patent and are primarily supplied by the posterior communicating arteries. No significant stenosis of the vertebral arteries. No aneurysm. OTHER: No dural venous sinus thrombosis on this non-dedicated study. IMPRESSION: 1. No large vessel occlusion, hemodynamically significant stenosis, aneurysm, or dissection in the head or neck. Electronically signed by: Donnice Mania MD  11/24/2024 05:32 PM EST RP Workstation: [id number]   CT HEAD CODE STROKE WO CONTRAST Result Date: 11/24/2024 EXAM: CT HEAD WITHOUT CONTRAST 11/24/2024 04:45:21 PM TECHNIQUE: CT of the head was performed without the administration of intravenous contrast. Automated exposure control, iterative reconstruction, and/or weight based adjustment of the mA/kV was utilized to reduce the radiation dose to as low as reasonably achievable. COMPARISON: 10/02/2024 CLINICAL HISTORY: Acute neurologic deficit; stroke suspected. Thinkable episode. Abnormal speech and vision. FINDINGS: BRAIN AND VENTRICLES: No acute hemorrhage. No evidence of acute infarct. No hydrocephalus. No extra-axial collection. No mass effect or midline shift. ORBITS: No acute abnormality. SINUSES: No acute abnormality. SOFT TISSUES AND SKULL: No acute soft tissue abnormality. No skull fracture. [place] Stroke Program Early CT Score (ASPECTS): Ganglionic (caudate, IC, lentiform nucleus, insula, M1-M3): 7 Supraganglionic (M4-M6): 3 Total: 10 IMPRESSION: 1. No acute intracranial abnormality. 2. ASPECTS score of 10. 3. These results were communicated to Dr. Lindzen at 4:51 PM on 11/24/2024 by secure text page via the [facility] messaging system. Electronically signed by: Lonni Necessary MD 11/24/2024 04:52 PM EST RP Workstation: [id number]     .Critical Care  Performed by: Freddi Hamilton, MD Authorized by: Freddi Hamilton, MD   Critical care provider statement:    Critical care time (minutes):  30   Critical care time was exclusive of:  Separately billable procedures and treating other patients   Critical care was necessary to treat or prevent imminent or life-threatening deterioration of the following conditions:  CNS failure or compromise   Critical care was time spent personally by me on the following activities:  Development of treatment plan with patient or surrogate, discussions with consultants, evaluation of patient's response to treatment,  examination of patient, ordering and review of laboratory studies, ordering and review of radiographic studies, ordering and performing treatments and interventions, pulse oximetry, re-evaluation of patient's condition and review of old charts    Medications Ordered in the ED  sodium chloride  flush (NS) 0.9 % injection 3 mL (3 mLs Intravenous Not Given 11/24/24 1730)  clevidipine  (CLEVIPREX ) infusion 0.5 mg/mL (2 mg/hr Intravenous Infusion Verify 11/24/24 1736)  iohexol  (OMNIPAQUE ) 350 MG/ML injection 75 mL (75 mLs Intravenous Contrast Given 11/24/24 1658)  tenecteplase  (TNKASE ) injection for stroke 24 mg (24 mg Intravenous Given 11/24/24 1714)                                    Medical Decision Making Amount and/or Complexity of Data Reviewed Labs: ordered.    Details: Mild hyperglycemia at 107 Radiology: ordered and independent interpretation performed.    Details: No head bleed ECG/medicine tests: ordered.  Risk Prescription drug management. Decision regarding hospitalization.   Patient was given TNK by neurology.  Was quite hypertensive as well and ended up being put on Cleviprex .  Patient's airway is stable.  There does appear to be a little bit of confusion.  Head CT was negative and no large vessel occlusion on CT angiography.  Will admit to the neuro ICU.     Final diagnoses:  Left-sided weakness    ED Discharge Orders     None          Freddi Hamilton, MD 11/24/24 1742  "
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
## 2024-11-24 NOTE — ED Notes (Signed)
 Full linen change, pt provided bedpan, pt cleaned and brief applied

## 2024-11-24 NOTE — Progress Notes (Signed)
 Routine EEG completed, results pending Neurology review and interpretation
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
## 2024-11-24 NOTE — ED Notes (Signed)
 Unable to get lab work RN aware.

## 2024-11-24 NOTE — Progress Notes (Signed)
 MB called and spoke to Nurse Jordan W. Pt is currently in MRI and is Not available

## 2024-11-24 NOTE — Progress Notes (Signed)
 PHARMACIST CODE STROKE RESPONSE  Notified to mix TNK at 1708 by Dr. Merrianne TNK preparation completed at 1713  TNK dose = 24 mg IV over 5 seconds  Issues/delays encountered (if applicable): brief delay in preparation while we clarified prior visit 09/2024 for stroke symptoms  Elma Fail, PharmD PGY1 Clinical Pharmacist [facility]  11/24/2024 5:25 PM

## 2024-11-25 ENCOUNTER — Inpatient Hospital Stay (HOSPITAL_COMMUNITY)

## 2024-11-25 DIAGNOSIS — R55 Syncope and collapse: Secondary | ICD-10-CM | POA: Diagnosis not present

## 2024-11-25 DIAGNOSIS — E119 Type 2 diabetes mellitus without complications: Secondary | ICD-10-CM | POA: Diagnosis not present

## 2024-11-25 DIAGNOSIS — I639 Cerebral infarction, unspecified: Secondary | ICD-10-CM | POA: Diagnosis not present

## 2024-11-25 DIAGNOSIS — E785 Hyperlipidemia, unspecified: Secondary | ICD-10-CM

## 2024-11-25 DIAGNOSIS — I10 Essential (primary) hypertension: Secondary | ICD-10-CM

## 2024-11-25 DIAGNOSIS — F32A Depression, unspecified: Secondary | ICD-10-CM | POA: Diagnosis not present

## 2024-11-25 DIAGNOSIS — F419 Anxiety disorder, unspecified: Secondary | ICD-10-CM

## 2024-11-25 DIAGNOSIS — E669 Obesity, unspecified: Secondary | ICD-10-CM | POA: Diagnosis not present

## 2024-11-25 DIAGNOSIS — R29701 NIHSS score 1: Secondary | ICD-10-CM | POA: Diagnosis not present

## 2024-11-25 DIAGNOSIS — R29702 NIHSS score 2: Secondary | ICD-10-CM | POA: Diagnosis not present

## 2024-11-25 DIAGNOSIS — Z6833 Body mass index (BMI) 33.0-33.9, adult: Secondary | ICD-10-CM | POA: Diagnosis not present

## 2024-11-25 DIAGNOSIS — G459 Transient cerebral ischemic attack, unspecified: Secondary | ICD-10-CM

## 2024-11-25 DIAGNOSIS — R2990 Unspecified symptoms and signs involving the nervous system: Secondary | ICD-10-CM

## 2024-11-25 LAB — LIPID PANEL
Cholesterol: 127 mg/dL (ref 0–200)
HDL: 46 mg/dL
LDL Cholesterol: 60 mg/dL (ref 0–99)
Total CHOL/HDL Ratio: 2.7 ratio
Triglycerides: 105 mg/dL
VLDL: 21 mg/dL (ref 0–40)

## 2024-11-25 LAB — TROPONIN T, HIGH SENSITIVITY: Troponin T High Sensitivity: <6 ng/L (ref 0–19)

## 2024-11-25 LAB — ECHOCARDIOGRAM COMPLETE
Area-P 1/2: 7.16 cm2
Calc EF: 62.8 %
Height: 65 in
S' Lateral: 2.2 cm
Single Plane A2C EF: 62.8 %
Single Plane A4C EF: 63.1 %
Weight: 3200 [oz_av]

## 2024-11-25 LAB — GLUCOSE, CAPILLARY
Glucose-Capillary: 156 mg/dL — ABNORMAL HIGH (ref 70–99)
Glucose-Capillary: 107 mg/dL — ABNORMAL HIGH (ref 70–99)
Glucose-Capillary: 108 mg/dL — ABNORMAL HIGH (ref 70–99)
Glucose-Capillary: 96 mg/dL (ref 70–99)
Glucose-Capillary: 189 mg/dL — ABNORMAL HIGH (ref 70–99)
Glucose-Capillary: 195 mg/dL — ABNORMAL HIGH (ref 70–99)

## 2024-11-25 LAB — MRSA NEXT GEN BY PCR, NASAL: MRSA by PCR Next Gen: NOT DETECTED

## 2024-11-25 LAB — HIV ANTIBODY (ROUTINE TESTING W REFLEX): HIV Screen 4th Generation wRfx: NONREACTIVE

## 2024-11-25 MED ORDER — LIDOCAINE 5 % EX PTCH
1.0000 | MEDICATED_PATCH | CUTANEOUS | Status: DC
  Administered 2024-11-25: 1 via TRANSDERMAL
  Filled 2024-11-25 (×2): qty 1

## 2024-11-25 MED ORDER — METOPROLOL TARTRATE 50 MG PO TABS
100.0000 mg | ORAL_TABLET | Freq: Two times a day (BID) | ORAL | Status: DC
  Administered 2024-11-25 (×2): 100 mg via ORAL
  Filled 2024-11-25 (×3): qty 2

## 2024-11-25 MED ORDER — ALPRAZOLAM 0.5 MG PO TABS
1.0000 mg | ORAL_TABLET | Freq: Four times a day (QID) | ORAL | Status: DC | PRN
  Administered 2024-11-25 – 2024-11-26 (×4): 1 mg via ORAL
  Filled 2024-11-25 (×4): qty 2

## 2024-11-25 MED ORDER — ASPIRIN 81 MG PO TBEC
81.0000 mg | DELAYED_RELEASE_TABLET | Freq: Every day | ORAL | Status: DC
  Administered 2024-11-25 – 2024-11-27 (×3): 81 mg via ORAL
  Filled 2024-11-25 (×3): qty 1

## 2024-11-25 MED ORDER — ALUM & MAG HYDROXIDE-SIMETH 200-200-20 MG/5ML PO SUSP
30.0000 mL | Freq: Four times a day (QID) | ORAL | Status: DC | PRN
  Administered 2024-11-25: 30 mL via ORAL
  Filled 2024-11-25: qty 30

## 2024-11-25 MED ORDER — ENOXAPARIN SODIUM 40 MG/0.4ML IJ SOSY
40.0000 mg | PREFILLED_SYRINGE | INTRAMUSCULAR | Status: DC
  Administered 2024-11-25 – 2024-11-26 (×2): 40 mg via SUBCUTANEOUS
  Filled 2024-11-25 (×2): qty 0.4

## 2024-11-25 MED ORDER — TORSEMIDE 20 MG PO TABS
20.0000 mg | ORAL_TABLET | Freq: Two times a day (BID) | ORAL | Status: DC
  Administered 2024-11-25: 20 mg via ORAL
  Filled 2024-11-25 (×2): qty 1

## 2024-11-25 MED ORDER — HYDRALAZINE HCL 20 MG/ML IJ SOLN: 5.0000 mg | INTRAMUSCULAR | Status: DC | PRN

## 2024-11-25 MED ORDER — PANTOPRAZOLE SODIUM 40 MG PO TBEC
40.0000 mg | DELAYED_RELEASE_TABLET | Freq: Every day | ORAL | Status: DC
  Administered 2024-11-25 – 2024-11-26 (×2): 40 mg via ORAL
  Filled 2024-11-25 (×2): qty 1

## 2024-11-25 MED ORDER — LABETALOL HCL 5 MG/ML IV SOLN: 10.0000 mg | INTRAVENOUS | Status: DC | PRN

## 2024-11-25 NOTE — Progress Notes (Signed)
 SLP Cancellation Note  Patient Details Name: Zoe Swanson MRN: [id number] DOB: September 27, 1976   Cancelled treatment:       Reason Eval/Treat Not Completed: Patient at procedure or test/unavailable at the time of my attempt. SLP will f/u.    Damien Blumenthal, M.A., CCC-SLP Speech Language Pathology, Acute Rehabilitation Services  Secure Chat preferred [phone]  11/25/2024, 11:39 AM
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
## 2024-11-25 NOTE — Progress Notes (Signed)
" °  Echocardiogram 2D Echocardiogram has been performed.  Tinnie FORBES Gosling RDCS 11/25/2024, 1:40 PM "
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
## 2024-11-25 NOTE — Progress Notes (Signed)
 OT Cancellation Note  Patient Details Name: Zoe Swanson MRN: [id number] DOB: 1975-12-06   Cancelled Treatment:    Reason Eval/Treat Not Completed: Active bedrest order (Pt s/p TNK 1/30 at 1714)   Rhodie Cienfuegos M. [place], OTR/L [facility] Acute Rehabilitation Services [phone] Secure Chat Preferred  Tatia Petrucci 11/25/2024, 7:12 AM

## 2024-11-25 NOTE — Progress Notes (Signed)
 Chaplain met with Zoe Swanson, provide education on advance directive, AD paperwork was left with Pt. [name] stated she would look over AD paperwork tomorrow and contact chaplain services once paperwork was complete.

## 2024-11-25 NOTE — Progress Notes (Signed)
 PT Cancellation Note  Patient Details Name: Zoe Swanson MRN: [id number] DOB: July 15, 1976   Cancelled Treatment:    Reason Eval/Treat Not Completed: Active bedrest order   Lenoard NOVAK Jamien Casanova 11/25/2024, 7:12 AM Lenoard SQUIBB, PT Acute Rehabilitation Services Office: [phone]

## 2024-11-25 NOTE — Progress Notes (Incomplete)
 STROKE TEAM PROGRESS NOTE    1/30: Presented with left-sided weakness and left sensory deficit.   CTH/CTA negative.   TNK administered at 1713  Negative EEG (ordered d/t facial twitching)  INTERIM HISTORY/SUBJECTIVE  Pending 24hour post-TNK imaging. Will remain in ICU for close monitoring and BP control (Goal <180/105)  OBJECTIVE  CBC    Component Value Date/Time   WBC 8.1 11/24/2024 1637   RBC 5.20 (H) 11/24/2024 1637   HGB 16.0 (H) 11/24/2024 1728   HCT 47.0 (H) 11/24/2024 1728   PLT 326 11/24/2024 1637   MCV 87.7 11/24/2024 1637   MCH 29.0 11/24/2024 1637   MCHC 33.1 11/24/2024 1637   RDW 13.9 11/24/2024 1637   LYMPHSABS 2.8 11/24/2024 1637   MONOABS 0.5 11/24/2024 1637   EOSABS 0.2 11/24/2024 1637   BASOSABS 0.1 11/24/2024 1637    BMET    Component Value Date/Time   NA 138 11/24/2024 1728   K 4.1 11/24/2024 1728   CL 99 11/24/2024 1728   CO2 25 11/24/2024 1637   GLUCOSE 107 (H) 11/24/2024 1728   BUN 14 11/24/2024 1728   CREATININE 1.00 11/24/2024 1728   CALCIUM 9.4 11/24/2024 1637   GFRNONAA >60 11/24/2024 1637    IMAGING past 24 hours EEG adult Result Date: 11/24/2024 Shelton Arlin KIDD, MD     11/24/2024  7:16 PM Patient Name: Zoe Swanson MRN: [id number] Epilepsy Attending: Arlin KIDD Shelton Referring Physician/Provider: everitt Clint Abbey Earle FORBES, NP Date: 11/24/2024 Duration: 32 mins Patient history: 49yo F with double vision/narrowed vision, left weakness, and left abnormal deviation per EMS. Level of alertness: Awake AEDs during EEG study: None Technical aspects: This EEG study was done with scalp electrodes positioned according to the 10-20 International system of electrode placement. Electrical activity was reviewed with band pass filter of 1-70Hz , sensitivity of 7 uV/mm, display speed of 54mm/sec with a 60Hz  notched filter applied as appropriate. EEG data were recorded continuously and digitally stored.  Video monitoring was available and reviewed as  appropriate. Description: The posterior dominant rhythm consists of 9 Hz activity of moderate voltage (25-35 uV) seen predominantly in posterior head regions, symmetric and reactive to eye opening and eye closing. Hyperventilation and photic stimulation were not performed.   IMPRESSION: This study is within normal limits. No seizures or epileptiform discharges were seen throughout the recording. A normal interictal EEG does not exclude the diagnosis of epilepsy. Priyanka O Yadav   CT ANGIO HEAD NECK W WO CM (CODE STROKE) Result Date: 11/24/2024 EXAM: CTA HEAD AND NECK WITH AND WITHOUT 11/24/2024 04:57:54 PM TECHNIQUE: CTA of the head and neck was performed without and with the administration of 75 mL iohexol  (OMNIPAQUE ) 350 MG/ML injection. Multiplanar 2D and/or 3D reformatted images are provided for review. Automated exposure control, iterative reconstruction, and/or weight based adjustment of the mA/kV was utilized to reduce the radiation dose to as low as reasonably achievable. Stenosis of the internal carotid arteries measured using NASCET criteria. COMPARISON: Same day CT head and CTA head and neck 10/02/2024. CLINICAL HISTORY: Neuro deficit, acute, stroke suspected. Acute neurologic deficit; stroke suspected. FINDINGS: CTA NECK: AORTIC ARCH AND ARCH VESSELS: Common origin of the brachiocephalic and left common carotid arteries. Streak artifact from dense venous contrast limits evaluation of the right subclavian artery. Visualized portions of the left subclavian artery are patent. No dissection or arterial injury. No significant stenosis of the brachiocephalic or subclavian arteries, acknowledging the limitation for the right subclavian artery. CERVICAL CAROTID ARTERIES: Similar minimal atherosclerosis  of the carotid arteries in the neck without hemodynamically significant stenosis. No dissection or arterial injury. CERVICAL VERTEBRAL ARTERIES: Streak artifact also limits evaluation of the V1 and proximal V2  segments of the vertebral arteries. Visualized portions of the vertebral arteries are patent to the vertebrobasilar confluence. No dissection or arterial injury. LUNGS AND MEDIASTINUM: Unremarkable. SOFT TISSUES: No acute abnormality. BONES: Reversal of the normal cervical lordosis. There are degenerative changes at multiple levels in the visualized cervical spine with disc space narrowing particularly at C4-C5, C5-C6, and C6-C7. CTA HEAD: ANTERIOR CIRCULATION: 2 mm inferior and slightly medially directed outpouching along the right supraclinoid ICA at the origin of the right posterior communicating artery compatible with an infundibulum. No significant stenosis of the internal carotid arteries. No significant stenosis of the anterior cerebral arteries. No significant stenosis of the middle cerebral arteries. No aneurysm. POSTERIOR CIRCULATION: There is mild irregularity of the proximal basilar artery without significant stenosis. There is distal tapering of the basilar artery past the origins of the superior cerebellar arteries. The posterior cerebral arteries are patent and are primarily supplied by the posterior communicating arteries. No significant stenosis of the vertebral arteries. No aneurysm. OTHER: No dural venous sinus thrombosis on this non-dedicated study. IMPRESSION: 1. No large vessel occlusion, hemodynamically significant stenosis, aneurysm, or dissection in the head or neck. Electronically signed by: Donnice Mania MD 11/24/2024 05:32 PM EST RP Workstation: [id number]   CT HEAD CODE STROKE WO CONTRAST Result Date: 11/24/2024 EXAM: CT HEAD WITHOUT CONTRAST 11/24/2024 04:45:21 PM TECHNIQUE: CT of the head was performed without the administration of intravenous contrast. Automated exposure control, iterative reconstruction, and/or weight based adjustment of the mA/kV was utilized to reduce the radiation dose to as low as reasonably achievable. COMPARISON: 10/02/2024 CLINICAL HISTORY: Acute neurologic  deficit; stroke suspected. Thinkable episode. Abnormal speech and vision. FINDINGS: BRAIN AND VENTRICLES: No acute hemorrhage. No evidence of acute infarct. No hydrocephalus. No extra-axial collection. No mass effect or midline shift. ORBITS: No acute abnormality. SINUSES: No acute abnormality. SOFT TISSUES AND SKULL: No acute soft tissue abnormality. No skull fracture. [place] Stroke Program Early CT Score (ASPECTS): Ganglionic (caudate, IC, lentiform nucleus, insula, M1-M3): 7 Supraganglionic (M4-M6): 3 Total: 10 IMPRESSION: 1. No acute intracranial abnormality. 2. ASPECTS score of 10. 3. These results were communicated to Dr. Lindzen at 4:51 PM on 11/24/2024 by secure text page via the [facility] messaging system. Electronically signed by: Lonni Necessary MD 11/24/2024 04:52 PM EST RP Workstation: [id number]    Vitals:   11/25/24 0700 11/25/24 0727 11/25/24 0800 11/25/24 0830  BP: (!) 137/93  (!) 138/94   Pulse: 83 91 91 95  Resp: 18 13 16 18   Temp:  98.1 F (36.7 C)    TempSrc:  Oral    SpO2: 99% 99% 98% 96%  Weight:      Height:         PHYSICAL EXAM General:  Alert, well-nourished, well-developed patient in no acute distress Psych:  Mood and affect appropriate for situation CV: Regular rate and rhythm on monitor Respiratory:  Regular, unlabored respirations on room air GI: Abdomen soft and nontender   NEURO:  Mental Status: AA&Ox3, patient is able to give clear and coherent history Speech/Language: speech is without dysarthria or aphasia.  Naming, repetition, fluency, and comprehension intact.  Cranial Nerves:  II: PERRL. Visual fields full.  III, IV, VI: EOMI. Eyelids elevate symmetrically.  V: Sensation is intact to light touch and symmetrical to face.  VII: Face is symmetrical resting and  smiling VIII: hearing intact to voice. IX, X: Palate elevates symmetrically. Phonation is normal.  KP:Dynloizm shrug 5/5. XII: tongue is midline without fasciculations. Motor: 5/5  strength to all muscle groups tested.  Tone: is normal and bulk is normal Sensation- Intact to light touch bilaterally. Extinction absent to light touch to DSS.   Coordination: FTN intact bilaterally, HKS: no ataxia in BLE.No drift.  Gait- deferred  Most Recent NIH ***     ASSESSMENT/PLAN  Zoe Swanson is a 49 y.o. female with history of *** admitted for ***.  NIH on Admission ***  {Neurological Diagnosis:29705}:  {gen right left:310021} *** s/p {Stroke Treatment:29704} Etiology:  ***  Code Stroke ***CT head No acute abnormality. ***Small vessel disease. Atrophy. ***ASPECTS 10. ***   CTA head & neck *** CT perfusion *** Post IR CT *** MRI  *** MRA  *** Carotid Doppler  *** 2D Echo *** LDL 60 HgbA1c 6.4 VTE prophylaxis - *** aspirin  81 mg daily prior to admission, now on No antithrombotic pending post-TNK imaging. If no hemorrhage, will start DAPT for 3 weeks then plavix daily.  Therapy recommendations:  Pending Disposition:  pending  Hypertension Home meds:  metoprolol  100mg  BID, torsemide  20mg  BID, valsaratan 80mg  BID Stable IVP PRNs and Cleviprex  gtt on board Wil start some home BP meds as well today.  Blood Pressure Goal: BP less than 180/105   Hyperlipidemia Home meds:  none LDL 60, goal < 70 High intensity statin not indicated d/t LD within goal at no treatment allergy to atorvastatin listed in Epic   Diabetes type II, Pre-Diabetic Home meds:  none HgbA1c 6.4, goal < 7.0 CBGs SSI Recommend close follow-up with PCP for better DM control  Tobacco Abuse Patient smokes *** packs per day for *** years {Ready to Quit?:21018006} Nicotine replacement therapy provided  Substance Abuse Patient uses *** UDS positive for *** THC *** Cocaine {Ready to Quit?:21018006} TOC consult for cessation placed  Dysphagia Patient has post-stroke dysphagia, SLP consulted    Diet   Diet heart healthy/carb modified Room service appropriate? Yes; Fluid consistency:  Thin   Advance diet as tolerated  Other Stroke Risk Factors ***ETOH use, alcohol level <15, advised to drink no more than 1***2 drink(s) a day ***Obesity, Body mass index is 33.28 kg/m., BMI >/= 30 associated with increased stroke risk, recommend weight loss, diet and exercise as appropriate  ***Family hx stroke (***) ***Coronary artery disease ***Congestive heart failure ***Obstructive sleep apnea, ***on CPAP at home ***Migraines   Other Active Problems   Hospital day # 1    To contact Stroke Continuity provider, please refer to [email or web address]. After hours, contact General Neurology

## 2024-11-26 ENCOUNTER — Other Ambulatory Visit (HOSPITAL_COMMUNITY): Payer: Self-pay

## 2024-11-26 DIAGNOSIS — R29701 NIHSS score 1: Secondary | ICD-10-CM

## 2024-11-26 DIAGNOSIS — G459 Transient cerebral ischemic attack, unspecified: Secondary | ICD-10-CM | POA: Diagnosis not present

## 2024-11-26 DIAGNOSIS — I639 Cerebral infarction, unspecified: Secondary | ICD-10-CM | POA: Diagnosis not present

## 2024-11-26 DIAGNOSIS — F419 Anxiety disorder, unspecified: Secondary | ICD-10-CM | POA: Diagnosis not present

## 2024-11-26 LAB — BASIC METABOLIC PANEL WITH GFR
Anion gap: 14 (ref 5–15)
BUN: 16 mg/dL (ref 6–20)
CO2: 22 mmol/L (ref 22–32)
Calcium: 9.2 mg/dL (ref 8.9–10.3)
Chloride: 99 mmol/L (ref 98–111)
Creatinine, Ser: 0.86 mg/dL (ref 0.44–1.00)
GFR, Estimated: >60 mL/min
Glucose, Bld: 133 mg/dL — ABNORMAL HIGH (ref 70–99)
Potassium: 4.2 mmol/L (ref 3.5–5.1)
Sodium: 136 mmol/L (ref 135–145)

## 2024-11-26 LAB — CBC
HCT: 41 % (ref 36.0–46.0)
Hemoglobin: 14.1 g/dL (ref 12.0–15.0)
MCH: 29 pg (ref 26.0–34.0)
MCHC: 34.4 g/dL (ref 30.0–36.0)
MCV: 84.4 fL (ref 80.0–100.0)
Platelets: 361 10*3/uL (ref 150–400)
RBC: 4.86 MIL/uL (ref 3.87–5.11)
RDW: 13.6 % (ref 11.5–15.5)
WBC: 7.9 10*3/uL (ref 4.0–10.5)
nRBC: 0 % (ref 0.0–0.2)

## 2024-11-26 LAB — GLUCOSE, CAPILLARY
Glucose-Capillary: 265 mg/dL — ABNORMAL HIGH (ref 70–99)
Glucose-Capillary: 115 mg/dL — ABNORMAL HIGH (ref 70–99)
Glucose-Capillary: 111 mg/dL — ABNORMAL HIGH (ref 70–99)
Glucose-Capillary: 165 mg/dL — ABNORMAL HIGH (ref 70–99)
Glucose-Capillary: 103 mg/dL — ABNORMAL HIGH (ref 70–99)
Glucose-Capillary: 129 mg/dL — ABNORMAL HIGH (ref 70–99)

## 2024-11-26 LAB — TROPONIN T, HIGH SENSITIVITY: Troponin T High Sensitivity: 16 ng/L (ref 0–19)

## 2024-11-26 MED ORDER — METOPROLOL TARTRATE 25 MG PO TABS
25.0000 mg | ORAL_TABLET | Freq: Two times a day (BID) | ORAL | Status: DC
  Administered 2024-11-26 – 2024-11-27 (×2): 25 mg via ORAL
  Filled 2024-11-26 (×2): qty 1

## 2024-11-26 MED ORDER — METOPROLOL TARTRATE 25 MG PO TABS
25.0000 mg | ORAL_TABLET | Freq: Two times a day (BID) | ORAL | 0 refills | Status: AC
  Filled 2024-11-26: qty 60, 30d supply, fill #0
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
## 2024-11-26 NOTE — NC FL2 (Signed)
 " [place]  MEDICAID FL2 LEVEL OF CARE FORM     IDENTIFICATION  Patient Name: LEEA RAMBEAU Birthdate: Dec 23, 1975 Sex: female Admission Date (Current Location): 11/24/2024  [facility] and [place] Number:  Producer, Television/film/video and Address:  [facility] [place], [address] [place], [place] [address]      Provider Number: [id number]  Attending Physician Name and Address:  Stroke, Md, MD  Relative Name and Phone Number:  Iridessa, [place]   [phone]    Current Level of Care: Hospital Recommended Level of Care: Skilled Nursing Facility Prior Approval Number:    Date Approved/Denied:   PASRR Number: [id number]  Discharge Plan: SNF    Current Diagnoses: Patient Active Problem List   Diagnosis Date Noted   Stroke-like symptoms 11/25/2024   Traumatic closed fracture of ulnar styloid with minimal displacement, left, sequela 11/24/2024   Migraines 10/09/2024   Type 2 diabetes mellitus without complications (HCC) 07/31/2024   Dyslipidemia 07/24/2024   SVT (supraventricular tachycardia) 09/22/2023   CKD (chronic kidney disease) stage 3, GFR 30-59 ml/min (HCC) 10/15/2021   Environmental and seasonal allergies 10/15/2021   Lumbar radiculopathy, chronic 06/01/2018   Palpitations 11/10/2017   Insomnia 11/10/2017   Weight gain 10/21/2017   Atrial mass    HTN (hypertension) 06/18/2014   GENERALIZED ANXIETY DISORDER 03/23/2008   ASTHMA, EXTRINSIC W/ACUTE EXACERBATION 03/14/2008   Asthma 08/23/2007    Orientation RESPIRATION BLADDER Height & Weight     Self, Time, Situation, Place  Normal Continent Weight: 200 lb (90.7 kg) Height:  5' 5 (165.1 cm)  BEHAVIORAL SYMPTOMS/MOOD NEUROLOGICAL BOWEL NUTRITION STATUS      Continent Diet (See dc summary)  AMBULATORY STATUS COMMUNICATION OF NEEDS Skin   Limited Assist Verbally Normal                       Personal Care Assistance Level of Assistance  Bathing, Feeding, Dressing Bathing Assistance:  Limited assistance Feeding assistance: Limited assistance Dressing Assistance: Limited assistance     Functional Limitations Info  Sight, Hearing, Speech Sight Info: Adequate Hearing Info: Adequate Speech Info: Adequate    SPECIAL CARE FACTORS FREQUENCY  PT (By licensed PT), OT (By licensed OT)     PT Frequency: 5x weekly OT Frequency: 5x weekly            Contractures      Additional Factors Info  Code Status, Allergies Code Status Info: 5x weekly Allergies Info: Atorvastatin;  Escitalopram ;  Hydrocodone ;  Mold Extract (Trichophyton);  Pollen Extract;  Oxycodone -acetaminophen ;  Percocet (Oxycodone -acetaminophen )           Current Medications (11/26/2024):  This is the current hospital active medication list Current Facility-Administered Medications  Medication Dose Route Frequency Provider Last Rate Last Admin   ALPRAZolam  (XANAX ) tablet 1 mg  1 mg Oral QID PRN Lehner, Erin C, NP   1 mg at 11/25/24 2205   alum & mag hydroxide-simeth (MAALOX/MYLANTA) 200-200-20 MG/5ML suspension 30 mL  30 mL Oral Q6H PRN Khaliqdina, Salman, MD   30 mL at 11/25/24 2259   aspirin  EC tablet 81 mg  81 mg Oral Daily Jerri Pfeiffer, MD   81 mg at 11/25/24 1832   Chlorhexidine  Gluconate Cloth 2 % PADS 6 each  6 each Topical Daily Khaliqdina, Salman, MD   6 each at 11/25/24 0949   clevidipine  (CLEVIPREX ) infusion 0.5 mg/mL  0-21 mg/hr Intravenous Continuous de Clint Kill, Earle BRAVO, NP   Stopped at 11/24/24 2123  enoxaparin  (LOVENOX ) injection 40 mg  40 mg Subcutaneous Q24H Jerri Pfeiffer, MD   40 mg at 11/25/24 2205   hydrALAZINE  (APRESOLINE ) injection 5 mg  5 mg Intravenous Q4H PRN Lehner, Erin C, NP       labetalol  (NORMODYNE ) injection 10 mg  10 mg Intravenous Q2H PRN Lehner, Erin C, NP       lidocaine  (LIDODERM ) 5 % 1 patch  1 patch Transdermal Q24H Khaliqdina, Salman, MD   1 patch at 11/25/24 0018   metoprolol  tartrate (LOPRESSOR ) tablet 100 mg  100 mg Oral BID Lehner, Erin C, NP   100 mg at  11/25/24 2205   Oral care mouth rinse  15 mL Mouth Rinse PRN Khaliqdina, Salman, MD       pantoprazole  (PROTONIX ) EC tablet 40 mg  40 mg Oral QHS Jerri Pfeiffer, MD   40 mg at 11/25/24 2205   senna-docusate (Senokot-S) tablet 1 tablet  1 tablet Oral QHS PRN Khaliqdina, Salman, MD       sodium chloride  flush (NS) 0.9 % injection 3 mL  3 mL Intravenous Once Goldston, Scott, MD       torsemide  (DEMADEX ) tablet 20 mg  20 mg Oral BID Jerri Pfeiffer, MD   20 mg at 11/25/24 1832   traMADol  (ULTRAM ) tablet 50 mg  50 mg Oral Q6H PRN Khaliqdina, Salman, MD   50 mg at 11/25/24 2039     Discharge Medications: Please see discharge summary for a list of discharge medications.  Relevant Imaging Results:  Relevant Lab Results:   Additional Information SSN: [id number]  Arlana JINNY Moats, LCSWA     "

## 2024-11-26 NOTE — Progress Notes (Signed)
 Occupational Therapy Treatment Patient Details Name: Zoe Swanson MRN: [id number] DOB: [birth year] Today's Date: 11/26/2024   History of present illness 49 yo F adm 11/24/24 with acute LUE weakness and syncope at PCP office s/p TNK, head CT (-). PMHx: spinal cord stimulator, CRPS, anxiety, asthma, CKD, DM, HTN   OT comments  Dr. Jerri requesting pt be seen for OT again today for updated recommendations/dispo planning given pt not accepted for CIR. Pt progressing nicely today; supportive husband present throughout and engaged for hands-on teaching. Pt able to ambulate to bathroom with L PFRW and CGA. CGA to transfer from the toilet, reliant on external support to push/support self. Discussed safe ways to manage clothes, toileting, UB/LB dressing, and showering. Also educated on benefits of OPOT for cognition, as pt still demo's reduced processing, some forgetfulness, and cognitive deficits. She is eager to start this and return back to working and being independent. Updating d/c recommendations to OPOT and recommending 3-in-1 BSC. CM and team updated. Will continue to follow while in house.      If plan is discharge home, recommend the following:  A little help with walking and/or transfers;A little help with bathing/dressing/bathroom;Assistance with cooking/housework;Assist for transportation;Help with stairs or ramp for entrance   Equipment Recommendations  BSC/3in1    Recommendations for Other Services      Precautions / Restrictions Precautions Precautions: Fall Recall of Precautions/Restrictions: Impaired Precaution/Restrictions Comments: BP <180/105 Required Braces or Orthoses: Splint/Cast Splint/Cast: L wrist splint for ulnar styloid fx Restrictions Weight Bearing Restrictions Per Provider Order: Yes LUE Weight Bearing Per Provider Order: Non weight bearing       Mobility Bed Mobility               General bed mobility comments: not assessed - pt OOB to chair throughout     Transfers Overall transfer level: Needs assistance Equipment used: Left platform walker Transfers: Sit to/from Stand Sit to Stand: Contact guard assist, Min assist           General transfer comment: min A on initial stand from the chair, improved to CGA with cues for hand placement & proper body mechanics.     Balance Overall balance assessment: Needs assistance Sitting-balance support: No upper extremity supported, Feet supported Sitting balance-Leahy Scale: Good Sitting balance - Comments: sits unsupported from backrest of chair without trunk sway/LOB   Standing balance support: Bilateral upper extremity supported, During functional activity, Reliant on assistive device for balance Standing balance-Leahy Scale: Poor Standing balance comment: heavily reliant on PFRW, stability improved with B shoes donned, no LLE instability/buckling noted                           ADL either performed or assessed with clinical judgement   ADL Overall ADL's : Needs assistance/impaired     Grooming: Contact guard assist;Standing;Wash/dry hands Grooming Details (indicate cue type and reason): sinkside, able to retrieve items with at least unilateral UE support on RW/vanity without LOB             Lower Body Dressing: Moderate assistance Lower Body Dressing Details (indicate cue type and reason): husband assisting with donning slippers; pt reported they helped her feel more balanced Toilet Transfer: Contact guard assist;Ambulation;Regular Toilet;Rolling walker (2 wheels) (L platform) Toilet Transfer Details (indicate cue type and reason): cued to widen BOS and squat down onto toilet for improved body mechanics and controlled descent, cues to safely approach toilet Toileting- Clothing Manipulation and Hygiene:  Contact guard assist;Sitting/lateral lean;Sit to/from stand Toileting - Clothing Manipulation Details (indicate cue type and reason): performed anterior peri care seated on  toilet, educated on compensatory technique for managing clothes up/down, maintaining unilateral UE support on RW     Functional mobility during ADLs: Contact guard assist;Rolling walker (2 wheels) (L platform)      Extremity/Trunk Assessment Upper Extremity Assessment LUE Deficits / Details: demo's sufficient grip strength to stabilize L platform on RW            Vision       Perception     Praxis     Communication Communication Communication: No apparent difficulties   Cognition Arousal: Alert Behavior During Therapy: WFL for tasks assessed/performed Cognition: Cognition impaired     Awareness: Online awareness impaired Memory impairment (select all impairments): Short-term memory Attention impairment (select first level of impairment): Alternating attention Executive functioning impairment (select all impairments): Organization, Reasoning OT - Cognition Comments: delayed processing, forgetful, needs repetitive cues to carryover safety cues & ADL modifications; pt reports she stills feels her cognition is foggy                 Following commands: Intact        Cueing   Cueing Techniques: Verbal cues, Gestural cues  Exercises      Shoulder Instructions       General Comments supportive husband present    Pertinent Vitals/ Pain       Pain Assessment Pain Assessment: Faces Faces Pain Scale: Hurts little more Pain Location: L shoulder Pain Descriptors / Indicators: Sore Pain Intervention(s): Limited activity within patient's tolerance, Monitored during session  Home Living                                          Prior Functioning/Environment              Frequency  Min 2X/week        Progress Toward Goals  OT Goals(current goals can now be found in the care plan section)  Progress towards OT goals: Progressing toward goals     Plan      Co-evaluation                 AM-PAC OT 6 Clicks Daily  Activity     Outcome Measure   Help from another person eating meals?: None Help from another person taking care of personal grooming?: A Little Help from another person toileting, which includes using toliet, bedpan, or urinal?: A Little Help from another person bathing (including washing, rinsing, drying)?: A Lot Help from another person to put on and taking off regular upper body clothing?: A Little Help from another person to put on and taking off regular lower body clothing?: A Lot 6 Click Score: 17    End of Session Equipment Utilized During Treatment: Gait belt;Rolling walker (2 wheels) (L platform)  OT Visit Diagnosis: Unsteadiness on feet (R26.81);Other abnormalities of gait and mobility (R26.89);Ataxia, unspecified (R27.0)   Activity Tolerance Patient tolerated treatment well   Patient Left in chair;with call bell/phone within reach;with family/visitor present   Nurse Communication Mobility status        Time: 8645-8577 OT Time Calculation (min): 28 min  Charges: OT General Charges $OT Visit: 1 Visit OT Treatments $Self Care/Home Management : 23-37 mins  Shaunna Rosetti M. Dane Kopke, OTR/L [facility] Acute Rehabilitation Services [phone] Secure  Chat Preferred  Rikki Milch 11/26/2024, 2:52 PM
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
## 2024-11-26 NOTE — Progress Notes (Signed)
 Inpatient Rehab Admissions Coordinator Note:   Per therapy patient was screened for CIR candidacy by Freemon Binford SHAUNNA Yvone Cohens, CCC-SLP. At this time, pt does not appear to demonstrate medical necessity, given negative imaging, to justify hospital rehabilitation/CIR. Will not pursue a rehab consult for this pt. Recommend other rehab venues to be pursued.    Tinnie Yvone Cohens, MS, CCC-SLP Admissions Coordinator [phone]  11/26/24 7:57 AM

## 2024-11-26 NOTE — Progress Notes (Signed)
 Physical Therapy Treatment Patient Details Name: Zoe Swanson MRN: [id number] DOB: Apr 10, 1976 Today's Date: 11/26/2024   History of Present Illness 49 yo F adm 11/24/24 with acute LUE weakness and syncope at PCP office s/p TNK, head CT (-). PMHx: spinal cord stimulator, CRPS, anxiety, asthma, CKD, DM, HTN    PT Comments  Contacted by Dr. Jerri regarding need for updated discharge plan (did not qualify for AIR) and ?discharge home today. Attempted multiple assistive devices to determine which was the safest for her. Ultimately she does best with Lt platform RW. Husband present and very attentive. Issued gait belt to use at home. Educated pt and spouse in up/down steps with and without rail with pt very anxious and ultimately progressing to min assist provided by spouse. Will benefit from OPPT for continued gait training. Patient requests OPPT at [facility] as this is near her home. Case manager made aware of DME and followup therapy needs.     If plan is discharge home, recommend the following: A little help with walking and/or transfers;A little help with bathing/dressing/bathroom;Assistance with cooking/housework;Direct supervision/assist for financial management;Assist for transportation;Help with stairs or ramp for entrance   Can travel by private vehicle        Equipment Recommendations  Rolling walker (2 wheels);Other (comment) (Left platform)    Recommendations for Other Services       Precautions / Restrictions Precautions Precautions: Fall Recall of Precautions/Restrictions: Impaired Precaution/Restrictions Comments: BP <180/105 Required Braces or Orthoses: Splint/Cast Splint/Cast: L wrist splint for ulnar styloid fx Restrictions Weight Bearing Restrictions Per Provider Order: Yes LUE Weight Bearing Per Provider Order: Non weight bearing     Mobility  Bed Mobility Overal bed mobility: Needs Assistance Bed Mobility: Supine to Sit     Supine to sit: Supervision           Transfers Overall transfer level: Needs assistance Equipment used: Left platform walker, Straight cane Transfers: Sit to/from Stand Sit to Stand: Min assist           General transfer comment: Stood from EOB with cane and min assist for balance; from recliner with L PFRW with vc's and CGA/min assist for forearm strap    Ambulation/Gait Ambulation/Gait assistance: Contact guard assist, Mod assist Gait Distance (Feet): 10 Feet (cane mod assist; 50 RW; 20 ft L PFRW) Assistive device: Left platform walker, Rolling walker (2 wheels), Straight cane Gait Pattern/deviations: Step-to pattern, Decreased stride length, Knee hyperextension - left, Staggering right   Gait velocity interpretation: 1.31 - 2.62 ft/sec, indicative of limited community ambulator   General Gait Details: pt with multiple LOB with cane and fnally agreed she was unsafe; pt denied WB restrictions for L wrist and tried RW (she has been using with RN and spouse); pt unable to bear signifcant weight through LUE and reported incr pain; transitioned to L PFRW and pt's gait much improved; no imbalance   Stairs Stairs: Yes Stairs assistance: Min assist Stair Management: One rail Right, One rail Left, No rails, Step to pattern, Forwards Number of Stairs: 1 (x 8 reps) General stair comments: her flight of steps has a landing and rail switches from L to R side, therefore practiced each way with single step and footboard as her railing; for 2 steps into home without rails, husband held pt's hand and under her elbow to provide support; later pt asked about getting into truck with running board and demonstrated how she would get in truck/step up backwards and step down forwards with L PFRW  Wheelchair Mobility     Tilt Bed    Modified Rankin (Stroke Patients Only) Modified Rankin (Stroke Patients Only) Pre-Morbid Rankin Score: No symptoms Modified Rankin: Moderately severe disability     Balance Overall balance  assessment: Needs assistance Sitting-balance support: No upper extremity supported, Feet supported Sitting balance-Leahy Scale: Fair Sitting balance - Comments: seated unsupported from backrest of chair; noted mild truncal sway when leaning forward in chair   Standing balance support: Single extremity supported, During functional activity Standing balance-Leahy Scale: Poor Standing balance comment: Reliant on UE support in stance; LLE instability noted                            Communication Communication Communication: No apparent difficulties  Cognition Arousal: Alert Behavior During Therapy: WFL for tasks assessed/performed   PT - Cognitive impairments: Safety/Judgement                       PT - Cognition Comments: Initially wanting to use a cane and took several significant LOB to persuade her that it is not the right DME for her (yet). Following commands: Intact      Cueing Cueing Techniques: Verbal cues  Exercises      General Comments General comments (skin integrity, edema, etc.): VSS per telemetry      Pertinent Vitals/Pain Pain Assessment Pain Assessment: Faces Faces Pain Scale: Hurts even more Pain Location: L wrist when using RW Pain Descriptors / Indicators: Aching, Discomfort, Grimacing Pain Intervention(s): Limited activity within patient's tolerance, Monitored during session, Repositioned, Other (comment) (added L platform to RW)    Home Living                          Prior Function            PT Goals (current goals can now be found in the care plan section) Acute Rehab PT Goals Patient Stated 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 Time For Goal Achievement: 12/09/24 Potential to Achieve Goals: Good Progress towards PT goals: Progressing toward goals    Frequency    Min 3X/week      PT Plan      Co-evaluation              AM-PAC PT 6 Clicks Mobility   Outcome Measure  Help needed turning from your  back to your side while in a flat bed without using bedrails?: A Little Help needed moving from lying on your back to sitting on the side of a flat bed without using bedrails?: A Little Help needed moving to and from a bed to a chair (including a wheelchair)?: A Little Help needed standing up from a chair using your arms (e.g., wheelchair or bedside chair)?: A Little Help needed to walk in hospital room?: A Little Help needed climbing 3-5 steps with a railing? : A Little 6 Click Score: 18    End of Session Equipment Utilized During Treatment: Gait belt Activity Tolerance: Patient tolerated treatment well Patient left: in chair;with call bell/phone within reach;with family/visitor present;Other (comment) Nurse Communication: Mobility status;Other (comment) (needs equipment prior to discharge) PT Visit Diagnosis: Other abnormalities of gait and mobility (R26.89);Difficulty in walking, not elsewhere classified (R26.2);Muscle weakness (generalized) (M62.81)     Time: 8744-8663 PT Time Calculation (min) (ACUTE ONLY): 41 min  Charges:    $Gait Training: 38-52 mins PT General Charges $$ ACUTE PT VISIT: 1  Visit                      Zoe RAMAN, PT Acute Rehabilitation Services  Office [phone]    Zoe Swanson 11/26/2024, 1:59 PM

## 2024-11-26 NOTE — TOC Progression Note (Addendum)
 Transition of Care [facility]) - Progression Note    Patient Details  Name: Zoe Swanson MRN: [id number] Date of Birth: Nov 27, 1975  [facility] CM/SW Contact  Arlana JINNY Nicholaus ISRAEL Phone Number: [phone] 11/26/2024, 8:41 AM  Clinical Narrative:   8:34 AM- CSW called and spoke with the patient over the phone. Patient is agreeable to being faxed out to SNFs. CSW explained that SNF process. Patient also inquired about HHPT. Patient is agreeable considering SNF options.   PASSR completed, fl2 completed, SNF offers pending.   Unit CSW/CM will continue to follow and monitor for dc readiness.                       Expected Discharge Plan and Services                                               Social Drivers of Health (SDOH) Interventions SDOH Screenings   Food Insecurity: Food Insecurity Present (11/24/2024)  Housing: Low Risk (11/24/2024)  Transportation Needs: No Transportation Needs (11/24/2024)  Utilities: At Risk (11/24/2024)  Depression (PHQ2-9): High Risk (05/17/2024)  Financial Resource Strain: Medium Risk (08/30/2024)  Social Connections: Unknown (03/06/2022)   Received from [facility]  Tobacco Use: Low Risk (11/24/2024)  Health Literacy: Adequate Health Literacy (08/30/2024)    Readmission Risk Interventions     No data to display

## 2024-11-26 NOTE — TOC CM/SW Note (Signed)
 SDOH reviewed and resources placed on AVS

## 2024-11-27 ENCOUNTER — Other Ambulatory Visit (HOSPITAL_BASED_OUTPATIENT_CLINIC_OR_DEPARTMENT_OTHER): Payer: Self-pay

## 2024-11-27 DIAGNOSIS — G459 Transient cerebral ischemic attack, unspecified: Secondary | ICD-10-CM | POA: Diagnosis not present

## 2024-11-27 DIAGNOSIS — I639 Cerebral infarction, unspecified: Secondary | ICD-10-CM | POA: Diagnosis not present

## 2024-11-27 DIAGNOSIS — R29701 NIHSS score 1: Secondary | ICD-10-CM | POA: Diagnosis not present

## 2024-11-27 DIAGNOSIS — F419 Anxiety disorder, unspecified: Secondary | ICD-10-CM | POA: Diagnosis not present

## 2024-11-27 DIAGNOSIS — F32A Depression, unspecified: Secondary | ICD-10-CM | POA: Diagnosis not present

## 2024-11-27 DIAGNOSIS — R55 Syncope and collapse: Secondary | ICD-10-CM | POA: Diagnosis not present

## 2024-11-27 LAB — GLUCOSE, CAPILLARY
Glucose-Capillary: 141 mg/dL — ABNORMAL HIGH (ref 70–99)
Glucose-Capillary: 91 mg/dL (ref 70–99)

## 2024-11-27 NOTE — Progress Notes (Signed)
 Patient discharged to lounge, DME needs are known and the platform walker should be delivered to lounge, pt refused BSC.   Laneta JAYSON Rao, RN 11/27/2024 11:37 AM

## 2024-11-27 NOTE — TOC Transition Note (Signed)
 Transition of Care [facility]) - Discharge Note   Patient Details  Name: Zoe Swanson MRN: [id number] Date of Birth: [birth year]  Transition of Care [facility]) CM/SW Contact:  Andrez JULIANNA George, RN Phone Number: 11/27/2024, 10:57 AM   Clinical Narrative:     [name] is to deliver the DME to patients room today.  Outpatient therapy referral sent and information on the AVS. Pt has transportation home.  Final next level of care: OP Rehab Barriers to Discharge: No Barriers Identified   Patient Goals and CMS Choice Patient states their goals for this hospitalization and ongoing recovery are:: return home   Choice offered to / list presented to : Patient      Discharge Placement                       Discharge Plan and Services Additional resources added to the After Visit Summary for     Discharge Planning Services: CM Consult Post Acute Care Choice: Durable Medical Equipment ([employer] has said they will have drivers tomorrow for DME delivery)          DME Arranged: Bedside commode, Walker platform DME Agency: [employer] Date DME Agency Contacted: 11/27/24 Time DME Agency Contacted: 1430 Representative spoke with at DME Agency: [place] HH Arranged: NA HH Agency: NA        Social Drivers of Health (SDOH) Interventions SDOH Screenings   Food Insecurity: Food Insecurity Present (11/24/2024)  Housing: Low Risk (11/24/2024)  Transportation Needs: No Transportation Needs (11/24/2024)  Utilities: At Risk (11/24/2024)  Depression (PHQ2-9): High Risk (05/17/2024)  Financial Resource Strain: Medium Risk (08/30/2024)  Social Connections: Unknown (03/06/2022)   Received from [facility]  Tobacco Use: Low Risk (11/24/2024)  Health Literacy: Adequate Health Literacy (08/30/2024)     Readmission Risk Interventions     No data to display

## 2024-11-27 NOTE — TOC CAGE-AID Note (Signed)
 Transition of Care [facility]) - CAGE-AID Screening   Patient Details  Name: Zoe Swanson MRN: [id number] Date of Birth: 04-Nov-1975  Transition of Care [facility]) CM/SW Contact:    Andrez JULIANNA George, RN Phone Number: 11/27/2024, 9:24 AM   Clinical Narrative:  Pt denied the need for counseling resources.  CAGE-AID Screening:    Have You Ever Felt You Ought to Cut Down on Your Drinking or Drug Use?: No Have People Annoyed You By Critizing Your Drinking Or Drug Use?: No Have You Felt Bad Or Guilty About Your Drinking Or Drug Use?: No Have You Ever Had a Drink or Used Drugs First Thing In The Morning to Steady Your Nerves or to Get Rid of a Hangover?: No CAGE-AID Score: 0  Substance Abuse Education Offered: Yes (refused)

## 2024-11-27 NOTE — Plan of Care (Signed)
" °  Problem: Self-Care: Goal: Ability to participate in self-care as condition permits will improve Outcome: Progressing Goal: Verbalization of feelings and concerns over difficulty with self-care will improve Outcome: Progressing Goal: Ability to communicate needs accurately will improve Outcome: Progressing   Problem: Nutrition: Goal: Risk of aspiration will decrease Outcome: Progressing Goal: Dietary intake will improve Outcome: Progressing   Problem: Clinical Measurements: Goal: Ability to maintain clinical measurements within normal limits will improve Outcome: Progressing Goal: Will remain free from infection Outcome: Progressing Goal: Diagnostic test results will improve Outcome: Progressing Goal: Respiratory complications will improve Outcome: Progressing Goal: Cardiovascular complication will be avoided Outcome: Progressing   Problem: Activity: Goal: Risk for activity intolerance will decrease Outcome: Progressing   Problem: Nutrition: Goal: Adequate nutrition will be maintained Outcome: Progressing   Problem: Pain Managment: Goal: General experience of comfort will improve and/or be controlled Outcome: Progressing   Problem: Elimination: Goal: Will not experience complications related to bowel motility Outcome: Progressing Goal: Will not experience complications related to urinary retention Outcome: Progressing   Problem: Safety: Goal: Ability to remain free from injury will improve Outcome: Progressing   Problem: Skin Integrity: Goal: Risk for impaired skin integrity will decrease Outcome: Progressing   "

## 2024-11-27 NOTE — Plan of Care (Signed)

## 2024-11-29 ENCOUNTER — Telehealth: Payer: Self-pay | Admitting: *Deleted

## 2024-11-29 NOTE — Transitions of Care (Post Inpatient/ED Visit) (Signed)
 "  11/29/2024  Name: Zoe Swanson MRN: [id number] DOB: December 13, 1975  Today's TOC FU Call Status: Today's TOC FU Call Status:: Successful TOC FU Call Completed TOC FU Call Complete Date: 11/29/24  Patient's Name and Date of Birth confirmed. Name, DOB  Transition Care Management Follow-up Telephone Call Date of Discharge: 12/15/24 Discharge Facility: [facility]) Type of Discharge: Inpatient Admission Primary Inpatient Discharge Diagnosis:: Stroke-like symptoms How have you been since you were released from the hospital?: Better Any questions or concerns?: Yes Patient Questions/Concerns:: Patient was concerned about getting in touch with rehab Patient Questions/Concerns Addressed: Other: (RN gave patient the number and informed her they were to call her, but she could follow up with them.)  Items Reviewed: Did you receive and understand the discharge instructions provided?: Yes Medications obtained,verified, and reconciled?: Yes (Medications Reviewed) Any new allergies since your discharge?: No Dietary orders reviewed?: No Do you have support at home?: Yes People in Home [RPT]: spouse Name of Support/Comfort Primary Source: Lonnie  Medications Reviewed Today: Medications Reviewed Today     Reviewed by Kennieth Cathlean DEL, RN (Case Manager) on 11/29/24 at 1149  Med List Status: <None>   Medication Order Taking? Sig Documenting Provider Last Dose Status Informant  albuterol  (VENTOLIN  HFA) 108 (90 Base) MCG/ACT inhaler [id number] Yes Inhale 2 puffs into the lungs every 4 (four) hours as needed for wheezing or shortness of breath. Charley Conger, PA-C  Active Self  ALPRAZolam  (XANAX ) 1 MG tablet [id number] Yes TAKE 1 TABLET BY MOUTH EVERY 6 HOURS AS NEEDED FOR ANXIETY [name] LABOR, MD  Active Self  ASPIRIN  LOW DOSE 81 MG tablet [id number] Yes Take 81 mg by mouth daily. [provider]  Active Self  Continuous Glucose Sensor (FREESTYLE LIBRE 3 PLUS SENSOR) MISC [id number] Yes  Change sensor every 15 days. [name] LABOR, MD  Active Self  cyclobenzaprine  (FLEXERIL ) 10 MG tablet [id number] Yes Take 1 tablet (10 mg total) by mouth 3 (three) times daily as needed for muscle spasms. [name] LABOR, MD  Active Self  diphenhydrAMINE  (BENADRYL ) 25 MG tablet [id number] Yes Take 25 mg by mouth every 6 (six) hours as needed for allergies (allergic reaction). [provider]  Active Self  EPINEPHrine  0.3 mg/0.3 mL IJ SOAJ injection [id number] Yes INJECT INTO THE MIDDLE OF THE OUTER THIGH AND HOLD FOR 3 SECONDS AS NEEDED FOR SEVERE ALLERGIC REACTION THEN CALL 911 IF USED. Charley Conger, PA-C  Active Self  Evolocumab  (REPATHA  SURECLICK) 140 MG/ML EMMANUEL [id number] Yes Inject 140 mg into the skin every 14 (fourteen) days. Mona Vinie JAYSON, MD  Active Self  metoprolol  tartrate (LOPRESSOR ) 25 MG tablet [id number] Yes Take 1 tablet (25 mg total) by mouth 2 (two) times daily. Judithe Rocky JAYSON, NP  Active   montelukast  (SINGULAIR ) 10 MG tablet [id number] Yes Take 1 tablet (10 mg total) by mouth at bedtime. Charley Conger, PA-C  Active Self  triamcinolone  cream (KENALOG ) 0.1 % [id number] Yes APPLY TO AFFECTED AREA TWICE A DAY  Patient taking differently: Apply 1 Application topically daily as needed (rash).   [name] LABOR, MD  Active Self  valsartan (DIOVAN) 80 MG tablet [id number] Yes Take 80 mg by mouth 2 (two) times daily. [provider]  Active Self            Home Care and Equipment/Supplies: Were Home Health Services Ordered?: NA Any new equipment or medical supplies ordered?: Yes Name of Medical supply agency?: [employer] Were you able to get the equipment/medical  supplies?: Yes Do you have any questions related to the use of the equipment/supplies?: No  Functional Questionnaire: Do you need assistance with bathing/showering or dressing?: Yes Do you need assistance with meal preparation?: Yes Do you need assistance with eating?: No Do you have difficulty maintaining  continence: No Do you need assistance with getting out of bed/getting out of a chair/moving?: Yes Do you have difficulty managing or taking your medications?: No  Follow up appointments reviewed: PCP Follow-up appointment confirmed?: No MD Provider Line Number:[phone] Given: Yes (Per patient stated she will call for appointment) Specialist Hospital Follow-up appointment confirmed?: Yes Date of Specialist follow-up appointment?: 12/13/24 Follow-Up Specialty Provider:: Dr Madonna Large Do you need transportation to your follow-up appointment?: No Do you understand care options if your condition(s) worsen?: Yes-patient verbalized understanding  SDOH Interventions Today    Flowsheet Row Most Recent Value  SDOH Interventions   Food Insecurity Interventions Intervention Not Indicated, Community Resources Provided  [employer] resources was provided by inpatient TOC at discharge]  Housing Interventions Intervention Not Indicated  Utilities Interventions Intervention Not Indicated  [patient had received assistance thru [facility]]  Depression Interventions/Treatment  Medication  [xanax ]    Goals Addressed             This Visit's Progress    [facility] Transitions of Care (TOC) Care Plan       Problems:  Recent Hospitalization for treatment of Stroke like symptoms Knowledge Deficit Related to Stroke like symptoms and No Hospital Follow Up Provider appointment Patient stated she would follow up with PCP  Goal:  Over the next 30 days, the patient will not experience hospital readmission  Interventions:   Stroke: Reviewed Importance of taking all medications as prescribed Reviewed Importance of attending all scheduled provider appointments Assessed for signs and symptoms of stroke Reviewed referrals to outpatient therapy Assessed for cognitive impairment  Patient Self Care Activities:  Attend all scheduled provider appointments Call pharmacy for medication refills 3-7 days in advance  of running out of medications Call provider office for new concerns or questions  Notify RN Care Manager of TOC call rescheduling needs Participate in Transition of Care Program/Attend TOC scheduled calls Take medications as prescribed   Patient will attend rehabilitation Patient will contact PCP for follow up appointment  Plan:  An initial telephone outreach has been scheduled for: [id number] Follow up with provider re: making a f/u appointment Next PCP appointment scheduled for: Per patient she will call for appointment. Patient declined RN assistance Telephone follow up appointment with care management team member scheduled for: Alan Ee 12/06/2024  1:00 Patient will follow up with neurologist      Discussed and offered 30 day TOC program.  Patient   consented.  The patient has been provided with contact information for the care management team and has been advised to call with any health -related questions or concerns.  The patient verbalized understanding with current plan of care.  The patient is directed to their insurance card regarding availability of benefits coverage    Cathlean Headland BSN RN [facility] Health Care Management Coordinator [email or web address] Direct Dial: [phone]  Fax: [phone] Website: [email or web address]  "

## 2024-12-06 ENCOUNTER — Inpatient Hospital Stay: Admitting: Family Medicine

## 2024-12-06 ENCOUNTER — Telehealth

## 2024-12-07 ENCOUNTER — Ambulatory Visit

## 2024-12-11 ENCOUNTER — Ambulatory Visit

## 2024-12-13 ENCOUNTER — Ambulatory Visit: Admitting: Cardiology

## 2024-12-14 ENCOUNTER — Ambulatory Visit: Admitting: Occupational Therapy

## 2024-12-14 ENCOUNTER — Ambulatory Visit: Admitting: Physical Therapy

## 2025-01-07 ENCOUNTER — Ambulatory Visit

## 2025-01-11 ENCOUNTER — Ambulatory Visit

## 2025-02-07 ENCOUNTER — Ambulatory Visit

## 2025-02-11 ENCOUNTER — Ambulatory Visit

## 2025-03-10 ENCOUNTER — Ambulatory Visit

## 2025-03-14 ENCOUNTER — Ambulatory Visit

## 2025-04-10 ENCOUNTER — Ambulatory Visit

## 2025-04-14 ENCOUNTER — Ambulatory Visit

## 2025-05-11 ENCOUNTER — Ambulatory Visit

## 2025-05-15 ENCOUNTER — Ambulatory Visit
